# Patient Record
Sex: Male | Born: 1960
Health system: Southern US, Community
[De-identification: ages and names within clinical notes are randomized; demographics above are authoritative.]

## PROBLEM LIST (undated history)

## (undated) DIAGNOSIS — K429 Umbilical hernia without obstruction or gangrene: Secondary | ICD-10-CM

## (undated) DIAGNOSIS — I10 Essential (primary) hypertension: Secondary | ICD-10-CM

## (undated) DIAGNOSIS — R972 Elevated prostate specific antigen [PSA]: Secondary | ICD-10-CM

## (undated) DIAGNOSIS — C61 Malignant neoplasm of prostate: Secondary | ICD-10-CM

## (undated) HISTORY — PX: COLONOSCOPY: SHX174

## (undated) HISTORY — PX: HERNIA REPAIR: SHX51

## (undated) HISTORY — DX: Elevated prostate specific antigen (PSA): R97.20

## (undated) HISTORY — PX: KNEE SURGERY: SHX244

---

## 2012-07-13 ENCOUNTER — Ambulatory Visit: Payer: Self-pay | Admitting: Orthopedic Surgery

## 2012-08-28 LAB — HM COLONOSCOPY: HM Colonoscopy: NORMAL

## 2014-10-10 LAB — LIPID PANEL
Cholesterol: 149 mg/dL (ref 0–200)
HDL: 47 mg/dL (ref 35–70)
LDL Cholesterol: 76 mg/dL
Triglycerides: 132 mg/dL (ref 40–160)

## 2014-10-10 LAB — HEMOGLOBIN A1C: Hgb A1c MFr Bld: 5.9 % (ref 4.0–6.0)

## 2014-12-12 LAB — PSA: PSA: 3.2

## 2015-01-22 ENCOUNTER — Other Ambulatory Visit: Payer: Self-pay | Admitting: Family Medicine

## 2015-03-22 ENCOUNTER — Other Ambulatory Visit: Payer: Self-pay | Admitting: Family Medicine

## 2015-04-11 DIAGNOSIS — J302 Other seasonal allergic rhinitis: Secondary | ICD-10-CM

## 2015-04-11 DIAGNOSIS — M25561 Pain in right knee: Secondary | ICD-10-CM

## 2015-04-11 DIAGNOSIS — M25512 Pain in left shoulder: Secondary | ICD-10-CM

## 2015-04-11 DIAGNOSIS — I152 Hypertension secondary to endocrine disorders: Secondary | ICD-10-CM | POA: Insufficient documentation

## 2015-04-11 DIAGNOSIS — E8881 Metabolic syndrome: Secondary | ICD-10-CM

## 2015-04-11 DIAGNOSIS — E1159 Type 2 diabetes mellitus with other circulatory complications: Secondary | ICD-10-CM | POA: Insufficient documentation

## 2015-04-11 DIAGNOSIS — R7309 Other abnormal glucose: Secondary | ICD-10-CM

## 2015-04-11 DIAGNOSIS — E785 Hyperlipidemia, unspecified: Secondary | ICD-10-CM

## 2015-04-11 DIAGNOSIS — K429 Umbilical hernia without obstruction or gangrene: Secondary | ICD-10-CM | POA: Insufficient documentation

## 2015-04-11 DIAGNOSIS — E119 Type 2 diabetes mellitus without complications: Secondary | ICD-10-CM | POA: Insufficient documentation

## 2015-04-11 DIAGNOSIS — R718 Other abnormality of red blood cells: Secondary | ICD-10-CM

## 2015-04-11 DIAGNOSIS — I10 Essential (primary) hypertension: Secondary | ICD-10-CM

## 2015-04-11 DIAGNOSIS — E669 Obesity, unspecified: Secondary | ICD-10-CM | POA: Insufficient documentation

## 2015-04-11 DIAGNOSIS — E66812 Obesity, class 2: Secondary | ICD-10-CM

## 2015-04-15 ENCOUNTER — Encounter: Payer: Self-pay | Admitting: Family Medicine

## 2015-04-15 DIAGNOSIS — J302 Other seasonal allergic rhinitis: Secondary | ICD-10-CM | POA: Insufficient documentation

## 2015-04-16 ENCOUNTER — Encounter (INDEPENDENT_AMBULATORY_CARE_PROVIDER_SITE_OTHER): Payer: Self-pay

## 2015-04-16 ENCOUNTER — Encounter: Payer: Self-pay | Admitting: Family Medicine

## 2015-04-16 ENCOUNTER — Ambulatory Visit (INDEPENDENT_AMBULATORY_CARE_PROVIDER_SITE_OTHER): Payer: 59 | Admitting: Family Medicine

## 2015-04-16 VITALS — BP 124/78 | HR 78 | Temp 98.9°F | Resp 14 | Ht 71.0 in | Wt 247.6 lb

## 2015-04-16 DIAGNOSIS — E785 Hyperlipidemia, unspecified: Secondary | ICD-10-CM | POA: Diagnosis not present

## 2015-04-16 DIAGNOSIS — I10 Essential (primary) hypertension: Secondary | ICD-10-CM

## 2015-04-16 DIAGNOSIS — Z23 Encounter for immunization: Secondary | ICD-10-CM | POA: Diagnosis not present

## 2015-04-16 DIAGNOSIS — E669 Obesity, unspecified: Secondary | ICD-10-CM | POA: Diagnosis not present

## 2015-04-16 DIAGNOSIS — J302 Other seasonal allergic rhinitis: Secondary | ICD-10-CM | POA: Diagnosis not present

## 2015-04-16 DIAGNOSIS — E66811 Obesity, class 1: Secondary | ICD-10-CM

## 2015-04-16 MED ORDER — QUINAPRIL-HYDROCHLOROTHIAZIDE 20-25 MG PO TABS: 1.0000 | ORAL_TABLET | Freq: Every day | ORAL | Status: DC

## 2015-04-16 NOTE — Progress Notes (Signed)
Name: Jeffrey Whitaker   MRN: [id number]    DOB: Nov 03, 1960   Date:04/16/2015       Progress Note  Subjective  Chief Complaint  Chief Complaint  Patient presents with  . Medication Refill    follow-up  . Hypertension  . Hyperlipidemia    HPI  Hypertension: taking medication as prescribed, bp is at goal, denies chest pain or SOB  Hyperlipidemia: he has been taking Pravachol and denies myalgia. Last labs done 09/2014 and LDL was at goal at 76.   Obesity: he has been losing weight, he has been working out to get ready for the [employer] trail. He was supposed to be gone for one week on the hike but they ran out of water ( the creeks were dry) so they only hiked for a day and a half, and had to return home.  He is still exercising to try to go back in the Spring.   Seasonal Allergic Rhinitis: doing well at this time, no rhinorrhea, nasal congestion or sneezing.    Patient Active Problem List   Diagnosis Date Noted  . Allergic rhinitis, seasonal 04/15/2015  . Abnormal blood sugar 04/11/2015  . Benign hypertension 04/11/2015  . Dyslipidemia 04/11/2015  . Obesity (BMI 30.0-34.9) 04/11/2015  . Right medial knee pain 04/11/2015  . Metabolic syndrome 01/21/9484  . Left shoulder pain 04/11/2015  . Umbilical hernia 46/27/0350  . Microcytosis 04/11/2015    Past Surgical History  Procedure Laterality Date  . Replacement total knee      Family History  Problem Relation Age of Onset  . Hypertension Mother   . CAD Mother     Social History   Social History  . Marital Status: Married    Spouse Name: N/A  . Number of Children: N/A  . Years of Education: N/A   Occupational History  . Not on file.   Social History Main Topics  . Smoking status: Former Research scientist (life sciences)  . Smokeless tobacco: Former Systems developer    Quit date: 07/28/1993  . Alcohol Use: No  . Drug Use: No  . Sexual Activity: Yes   Other Topics Concern  . Not on file   Social History Narrative     Current outpatient  prescriptions:  .  aspirin 81 MG chewable tablet, Chew 1 tablet by mouth daily., Disp: , Rfl:  .  Multiple Vitamin tablet, Take 1 tablet by mouth daily., Disp: , Rfl:  .  pravastatin (PRAVACHOL) 40 MG tablet, Take 1 tablet by mouth  daily, Disp: 90 tablet, Rfl: 1 .  quinapril-hydrochlorothiazide (ACCURETIC) 20-25 MG per tablet, Take 1 tablet by mouth daily., Disp: 90 tablet, Rfl: 1  No Known Allergies   ROS  Constitutional: Negative for fever and positive for  weight change.  Respiratory: Negative for cough and shortness of breath.   Cardiovascular: Negative for chest pain or palpitations.  Gastrointestinal: Negative for abdominal pain, no bowel changes.  Musculoskeletal: Negative for gait problem or joint swelling.  Skin: Negative for rash.  Neurological: Negative for dizziness or headache.  No other specific complaints in a complete review of systems (except as listed in HPI above).  Objective  Filed Vitals:   04/16/15 0806  BP: 124/78  Pulse: 78  Temp: 98.9 F (37.2 C)  TempSrc: Oral  Resp: 14  Height: 5\' 11"  (1.803 m)  Weight: 247 lb 9.6 oz (112.311 kg)  SpO2: 96%    Body mass index is 34.55 kg/(m^2).  Physical Exam  Constitutional: Patient appears well-developed and  well-nourished. Obese No distress.  HEENT: head atraumatic, normocephalic, pupils equal and reactive to light, neck supple, throat within normal limits Cardiovascular: Normal rate, regular rhythm and normal heart sounds.  No murmur heard. No BLE edema. Pulmonary/Chest: Effort normal and breath sounds normal. No respiratory distress. Abdominal: Soft.  There is no tenderness. Psychiatric: Patient has a normal mood and affect. behavior is normal. Judgment and thought content normal.   PHQ2/9: Depression screen PHQ 2/9 04/16/2015  Decreased Interest 0  Down, Depressed, Hopeless 0  PHQ - 2 Score 0     Fall Risk: Fall Risk  04/16/2015  Falls in the past year? No     Functional Status Survey: Is  the patient deaf or have difficulty hearing?: No Does the patient have difficulty seeing, even when wearing glasses/contacts?: Yes (glasses) Does the patient have difficulty concentrating, remembering, or making decisions?: No Does the patient have difficulty walking or climbing stairs?: No Does the patient have difficulty dressing or bathing?: No Does the patient have difficulty doing errands alone such as visiting a doctor's office or shopping?: No   Assessment & Plan  1. Benign hypertension At normal range, continue medication - quinapril-hydrochlorothiazide (ACCURETIC) 20-25 MG per tablet; Take 1 tablet by mouth daily.  Dispense: 90 tablet; Refill: 1  2. Needs flu shot  - Flu Vaccine QUAD 36+ mos PF IM (Fluarix & Fluzone Quad PF)   3. Allergic rhinitis, seasonal Not currently on medication, symptoms are usually Spring and rarely in the Fall  4. Obesity (BMI 30.0-34.9) He is down on his BMI, doing well with life style modification, encouraged him to continue the good work   5. Dyslipidemia Continue medication, recheck labs next visit

## 2015-05-11 ENCOUNTER — Encounter: Payer: Self-pay | Admitting: Family Medicine

## 2015-05-11 ENCOUNTER — Ambulatory Visit (INDEPENDENT_AMBULATORY_CARE_PROVIDER_SITE_OTHER): Payer: 59 | Admitting: Family Medicine

## 2015-05-11 VITALS — BP 114/80 | HR 78 | Temp 98.0°F | Resp 18 | Ht 71.0 in | Wt 251.7 lb

## 2015-05-11 DIAGNOSIS — E669 Obesity, unspecified: Secondary | ICD-10-CM | POA: Diagnosis not present

## 2015-05-11 DIAGNOSIS — E8881 Metabolic syndrome: Secondary | ICD-10-CM | POA: Diagnosis not present

## 2015-05-11 MED ORDER — NALTREXONE-BUPROPION HCL ER 8-90 MG PO TB12: 2.0000 | ORAL_TABLET | Freq: Two times a day (BID) | ORAL | Status: DC

## 2015-05-11 NOTE — Progress Notes (Signed)
Name: Jeffrey Whitaker   MRN: [id number]    DOB: [birth year]   Date:05/11/2015       Progress Note  Subjective  Chief Complaint  Chief Complaint  Patient presents with  . Obesity    Patient is using a Physiological scientist 2x weekly and going to [employer] 45-60 minutes, the other two days. [employer] and has to fill out appeal for not reaching BMI required by [facility].     HPI  Obesity: he has changed his diet and has been exercising, he has lost 14 lbs since last year, but has reached a plateau, he would like to try medication to see if it will help him lose more weight.   Metabolic Syndrome: on diet and exercise, denies polyphagia, polyuria or polydipsia  Patient Active Problem List   Diagnosis Date Noted  . Allergic rhinitis, seasonal 04/15/2015  . Abnormal blood sugar 04/11/2015  . Benign hypertension 04/11/2015  . Dyslipidemia 04/11/2015  . Obesity (BMI 30.0-34.9) 04/11/2015  . Right medial knee pain 04/11/2015  . Metabolic syndrome 63/84/6659  . Left shoulder pain 04/11/2015  . Umbilical hernia 93/57/0177  . Microcytosis 04/11/2015    Past Surgical History  Procedure Laterality Date  . Replacement total knee      Family History  Problem Relation Age of Onset  . Hypertension Mother   . CAD Mother     Social History   Social History  . Marital Status: Married    Spouse Name: N/A  . Number of Children: N/A  . Years of Education: N/A   Occupational History  . Not on file.   Social History Main Topics  . Smoking status: Former Research scientist (life sciences)  . Smokeless tobacco: Former Systems developer    Quit date: 07/28/1993  . Alcohol Use: No  . Drug Use: No  . Sexual Activity: Yes   Other Topics Concern  . Not on file   Social History Narrative     Current outpatient prescriptions:  .  aspirin 81 MG chewable tablet, Chew 1 tablet by mouth daily., Disp: , Rfl:  .  Multiple Vitamin tablet, Take 1 tablet by mouth daily., Disp: , Rfl:  .  pravastatin (PRAVACHOL) 40 MG tablet,  Take 1 tablet by mouth  daily, Disp: 90 tablet, Rfl: 1 .  quinapril-hydrochlorothiazide (ACCURETIC) 20-25 MG per tablet, Take 1 tablet by mouth daily., Disp: 90 tablet, Rfl: 1  No Known Allergies   ROS  Constitutional: Negative for fever or significant  weight change.  Respiratory: Negative for cough and shortness of breath.   Cardiovascular: Negative for chest pain or palpitations.  Gastrointestinal: Negative for abdominal pain, no bowel changes.  Musculoskeletal: Negative for gait problem or joint swelling.  Skin: Negative for rash.  Neurological: Negative for dizziness or headache.  No other specific complaints in a complete review of systems (except as listed in HPI above).  Objective  Filed Vitals:   05/11/15 1114  BP: 114/80  Pulse: 78  Temp: 98 F (36.7 C)  TempSrc: Oral  Resp: 18  Height: 5\' 11"  (1.803 m)  Weight: 251 lb 11.2 oz (114.17 kg)  SpO2: 98%    Body mass index is 35.12 kg/(m^2).  Physical Exam  Constitutional: Patient appears well-developed and well-nourished. Obese No distress.  HEENT: head atraumatic, normocephalic, pupils equal and reactive to light, neck supple, throat within normal limits Cardiovascular: Normal rate, regular rhythm and normal heart sounds.  No murmur heard. No BLE edema. Pulmonary/Chest: Effort normal and breath sounds normal. No respiratory  distress. Abdominal: Soft.  There is no tenderness. Psychiatric: Patient has a normal mood and affect. behavior is normal. Judgment and thought content normal.   PHQ2/9: Depression screen PHQ 2/9 04/16/2015  Decreased Interest 0  Down, Depressed, Hopeless 0  PHQ - 2 Score 0    Fall Risk: Fall Risk  04/16/2015  Falls in the past year? No     Assessment & Plan  1. Obesity (BMI 30.0-34.9)  Discussed all optiosns for weight loss medications including Belviq, Qsymia, Saxenda and Contrave. Discussed risk and benefits of each of them. Discussed with the patient the risk posed by an  increased BMI. Discussed importance of portion control, calorie counting and at least 150 minutes of physical activity weekly. Avoid sweet beverages and drink more water. Eat at least 6 servings of fruit and vegetables daily   - Naltrexone-Bupropion HCl ER (CONTRAVE) 8-90 MG TB12; Take 2 tablets by mouth 2 (two) times daily.  Dispense: 120 tablet; Refill: 1  2. Metabolic syndrome  Continue life style modification

## 2015-05-18 ENCOUNTER — Ambulatory Visit
Admission: EM | Admit: 2015-05-18 | Discharge: 2015-05-18 | Disposition: A | Discharge status: Home / Self Care | Payer: 59 | Attending: Emergency Medicine | Admitting: Emergency Medicine

## 2015-05-18 ENCOUNTER — Encounter: Payer: Self-pay | Admitting: Emergency Medicine

## 2015-05-18 DIAGNOSIS — S76311A Strain of muscle, fascia and tendon of the posterior muscle group at thigh level, right thigh, initial encounter: Secondary | ICD-10-CM | POA: Diagnosis not present

## 2015-05-18 DIAGNOSIS — T148 Other injury of unspecified body region: Secondary | ICD-10-CM | POA: Diagnosis not present

## 2015-05-18 DIAGNOSIS — T148XXA Other injury of unspecified body region, initial encounter: Secondary | ICD-10-CM

## 2015-05-18 HISTORY — DX: Essential (primary) hypertension: I10

## 2015-05-18 MED ORDER — IBUPROFEN 800 MG PO TABS: 800.0000 mg | ORAL_TABLET | Freq: Three times a day (TID) | ORAL | Status: DC | PRN

## 2015-05-18 MED ORDER — CYCLOBENZAPRINE HCL 10 MG PO TABS: 10.0000 mg | ORAL_TABLET | Freq: Three times a day (TID) | ORAL | Status: DC | PRN

## 2015-05-18 NOTE — ED Notes (Signed)
Pt reports right hip pain that started yesterday after working out and unsure if "pulled something" Pt reports pain down in right hamstring as well

## 2015-05-18 NOTE — ED Provider Notes (Signed)
[facility] Emergency Department Provider Note  ____________________________________________  Time seen: Approximately 2:24 PM  I have reviewed the triage vital signs and the nursing notes.   HISTORY  Chief Complaint Hip Pain   HPI Jeffrey Whitaker is a 54 y.o. male presents for complaints of right posterior leg and right buttocks area pain. States pain onset was after working out with Physiological scientist yesterday. Patient reports that yesterday afternoon he was working out with a Physiological scientist. States his personal trainer workout session was approximately 45 minutes long. Patient reports that during the session the activities included a variety of activity.  States that majority of the workout included squatting throwing a medicine ball. Also reports planking, running, and bench presses. Patient states that he felt some achy pain in right hamstring and buttocks area while squatting and throwing medicine ball but states the pain was mild. Patient states when he got home yesterday after working out he sat still for approximately 1 hour and states when he got up pain had increased. Patient denies fall or direct trauma. Denies difficulty with full range of motion but reports some pain with range of motion. States current pain is 4 out of 10. States pain is primarily with movement. State pain is described as an ache and tension pain, and pulling with movement. States minimal pain with bending or twisting, but more pain with walking and squatting motions. Denies numbness or tingling sensation. Denies pain radiation. Denies neck or back pain.  Denies urinary or bowel retention or incontinence. Denies fever, chest pain, abdominal pain, neck pain, back pain or shortness of breath. Denies upper extremity pain. Denies other leg pain. Reports continues to eat and drink well. Again denies fall or direct trauma. Denies redness or swelling.    Past Medical History  Diagnosis Date  .  Hypertension     Patient Active Problem List   Diagnosis Date Noted  . Allergic rhinitis, seasonal 04/15/2015  . Abnormal blood sugar 04/11/2015  . Benign hypertension 04/11/2015  . Dyslipidemia 04/11/2015  . Obesity (BMI 30.0-34.9) 04/11/2015  . Right medial knee pain 04/11/2015  . Metabolic syndrome 63/87/5643  . Left shoulder pain 04/11/2015  . Umbilical hernia 32/95/1884  . Microcytosis 04/11/2015    Past Surgical History  Procedure Laterality Date  . Replacement total knee      Current Outpatient Rx  Name  Route  Sig  Dispense  Refill  .           .           .           . Multiple Vitamin tablet   Oral   Take 1 tablet by mouth daily.         .           . pravastatin (PRAVACHOL) 40 MG tablet      Take 1 tablet by mouth  daily   90 tablet   1   . quinapril-hydrochlorothiazide (ACCURETIC) 20-25 MG per tablet   Oral   Take 1 tablet by mouth daily.   90 tablet   1     Allergies Review of patient's allergies indicates no known allergies.  Family History  Problem Relation Age of Onset  . Hypertension Mother   . CAD Mother     Social History Social History  Substance Use Topics  . Smoking status: Former Research scientist (life sciences)  . Smokeless tobacco: Former Systems developer    Quit date: 07/28/1993  . Alcohol  Use: No    Review of Systems Constitutional: No fever/chills Eyes: No visual changes. ENT: No sore throat. Cardiovascular: Denies chest pain. Respiratory: Denies shortness of breath. Gastrointestinal: No abdominal pain.  No nausea, no vomiting.  No diarrhea.  No constipation. Genitourinary: Negative for dysuria. Musculoskeletal: Negative for back pain. Right leg and buttocks pain.  Skin: Negative for rash. Neurological: Negative for headaches, focal weakness or numbness.  10-point ROS otherwise negative.  ____________________________________________   PHYSICAL EXAM:  VITAL SIGNS: ED Triage Vitals  Enc Vitals Group     BP 05/18/15 1401 139/76 mmHg      Pulse Rate 05/18/15 1401 74     Resp 05/18/15 1401 16     Temp 05/18/15 1401 98 F (36.7 C)     Temp Source 05/18/15 1401 Oral     SpO2 05/18/15 1401 99 %     Weight 05/18/15 1401 245 lb (111.131 kg)     Height 05/18/15 1401 5\' 11"  (1.803 m)     Head Cir --      Peak Flow --      Pain Score 05/18/15 1402 7     Pain Loc --      Pain Edu? --      Excl. in [place]? --     Constitutional: Alert and oriented. Well appearing and in no acute distress. Eyes: Conjunctivae are normal. PERRL. EOMI. Head: Atraumatic.  Nose: No congestion/rhinnorhea.  Mouth/Throat: Mucous membranes are moist.  Oropharynx non-erythematous. Neck: No stridor.  No cervical spine tenderness to palpation. Cardiovascular: Normal rate, regular rhythm. Grossly normal heart sounds.  Good peripheral circulation. Respiratory: Normal respiratory effort.  No retractions. Lungs CTAB. Gastrointestinal: Soft and nontender. No distention. Normal Bowel sounds.  No CVA tenderness.  Musculoskeletal: No lower or upper extremity tenderness nor edema.  No joint effusions. Bilateral pedal pulses equal and easily palpated.No cervical, thoracic or lumbar TTP.  Except: right poster gluteus mild TTP, right posterior generalized hamstring mild TTP, no swelling, no ecchymosis, no erythema. Changes positions from lying to standing quickly without discomfort or distress. No saddle anesthesia. No bony TTP. Right knee nontender. No leg swelling. No calf TTP bilaterally. Full ROM to back and bilateral lower extremities. Pain to right gluteus and right hamstring increases with squatting movement, minimal pain with hip and knee flexion. No pain with hip abduction or adduction. Ambulatory in room with steady gait. No antalgic gait.  Neurologic:  Normal speech and language. No gross focal neurologic deficits are appreciated. No gait instability. Skin:  Skin is warm, dry and intact. No rash noted. Psychiatric: Mood and affect are normal. Speech and behavior are  normal.  ____________________________________________   LABS (all labs ordered are listed, but only abnormal results are displayed)  Labs Reviewed - No data to display   INITIAL IMPRESSION / ASSESSMENT AND PLAN / ED COURSE  Pertinent labs & imaging results that were available during my care of the patient were reviewed by me and considered in my medical decision making (see chart for details).  Very well-appearing patient. No acute distress. Changes positions from lying to standing quickly without distress. Presents for right posterior hamstring and right gluteal pain 1 day. Pain onset post working out. States pain is gradually increased. Reports pain is primarily with movement. No bony tenderness. Point muscular tenderness. Patient reports over-the-counter ibuprofen has helped but not relief pain. Patient states that he walks several miles a day at work and the pain aggravated him at work today. No bony tenderness. Denies  trauma. Suspect muscular strain with point muscular TTP to right posterior hamstring generalized and right gluteus muscle. Will treat with supportive treatments including rest, ice and heat alternation, when necessary ibuprofen or Tylenol, Flexeril as needed for pain. Discussed continued stretching. Discussed avoidance of early strenuous activity.Discussed follow up with Primary care physician this week. Discussed follow up and return parameters including no resolution or any worsening concerns. Patient verbalized understanding and agreed to plan.   ____________________________________________   FINAL CLINICAL IMPRESSION(S) / ED DIAGNOSES  Final diagnoses:  Muscle strain  Hamstring strain, right, initial encounter       Marylene Land, NP 05/18/15 1609

## 2015-05-18 NOTE — Discharge Instructions (Signed)
Alternate heat and ice. Stretch. Take medication as prescribed. Avoid strenuous activity.  Follow up with your primary care physician this week as needed. Return to Urgent care for new or worsening concerns.   Muscle Strain A muscle strain is an injury that occurs when a muscle is stretched beyond its normal length. Usually a small number of muscle fibers are torn when this happens. Muscle strain is rated in degrees. First-degree strains have the least amount of muscle fiber tearing and pain. Second-degree and third-degree strains have increasingly more tearing and pain.  Usually, recovery from muscle strain takes 1-2 weeks. Complete healing takes 5-6 weeks.  CAUSES  Muscle strain happens when a sudden, violent force placed on a muscle stretches it too far. This may occur with lifting, sports, or a fall.  RISK FACTORS Muscle strain is especially common in athletes.  SIGNS AND SYMPTOMS At the site of the muscle strain, there may be:  Pain.  Bruising.  Swelling.  Difficulty using the muscle due to pain or lack of normal function. DIAGNOSIS  Your health care provider will perform a physical exam and ask about your medical history. TREATMENT  Often, the best treatment for a muscle strain is resting, icing, and applying cold compresses to the injured area.  HOME CARE INSTRUCTIONS   Use the PRICE method of treatment to promote muscle healing during the first 2-3 days after your injury. The PRICE method involves:  Protecting the muscle from being injured again.  Restricting your activity and resting the injured body part.  Icing your injury. To do this, put ice in a plastic bag. Place a towel between your skin and the bag. Then, apply the ice and leave it on from 15-20 minutes each hour. After the third day, switch to moist heat packs.  Apply compression to the injured area with a splint or elastic bandage. Be careful not to wrap it too tightly. This may interfere with blood circulation  or increase swelling.  Elevate the injured body part above the level of your heart as often as you can.  Only take over-the-counter or prescription medicines for pain, discomfort, or fever as directed by your health care provider.  Warming up prior to exercise helps to prevent future muscle strains. SEEK MEDICAL CARE IF:   You have increasing pain or swelling in the injured area.  You have numbness, tingling, or a significant loss of strength in the injured area. MAKE SURE YOU:   Understand these instructions.  Will watch your condition.  Will get help right away if you are not doing well or get worse.   This information is not intended to replace advice given to you by your health care provider. Make sure you discuss any questions you have with your health care provider.   Document Released: 07/14/2005 Document Revised: 05/04/2013 Document Reviewed: 02/10/2013 Elsevier Interactive Patient Education 2016 [place].  Hamstring Strain A hamstring strain is an injury that occurs when the hamstring muscles are overstretched or overloaded. The hamstring muscles are a group of muscles at the back of the thighs. These muscles are used in straightening the hips, bending the knees, and pulling back the legs. This type of injury is often called a pulled hamstring muscle. The severity of a muscle strain is rated in degrees. First-degree strains have the least amount of muscle fiber tearing and pain. Second-degree and third-degree strains have increasingly more tearing and pain. CAUSES Hamstring strains occur when a sudden, violent force is placed on these muscles  and stretches them too far. This often occurs during activities that involve running, jumping, kicking, or weight lifting. RISK FACTORS Hamstring strains are especially common in athletes. Other things that can increase your risk for this injury include:  Having low strength, endurance, or flexibility of the hamstring  muscles.  Performing high-impact physical activity.  Having poor physical fitness.  Having a previous leg injury.  Having fatigued muscles.  Older age. SIGNS AND SYMPTOMS  Pain in the back of the thigh.  Bruising.  Swelling.  Muscle spasm.  Difficulty using the muscle because of pain or lack of normal function. For severe strains, you may have a popping or snapping feeling when the injury occurs. DIAGNOSIS Your health care provider will perform a physical exam and ask about your medical history.  TREATMENT Often, the best treatment for a hamstring strain is protecting, resting, icing, applying compression, and elevating the injured area. This is referred to as the PRICE method of treatment. Your health care provider may also recommend medicines to help reduce pain or inflammation. HOME CARE INSTRUCTIONS  Use the PRICE method of treatment to promote muscle healing during the first 2-3 days after your injury. The PRICE method involves:  P--Protecting the muscle from being injured again.  R--Restricting your activity and resting the injured body part.  I--Icing your injury. To do this, put ice in a plastic bag. Place a towel between your skin and the bag. Then, apply the ice and leave it on for 20 minutes, 2-3 times per day. After the third day, switch to moist heat packs.  C--Applying compression to the injured area with an elastic bandage. Be careful not to wrap it too tightly. That may interfere with blood circulation or may increase swelling.  E--Elevating the injured body part above the level of your heart as often as you can. You can do this by putting a pillow under your thigh when you sit or lie down.  Take medicines only as directed by your health care provider.  Begin exercising or stretching as directed by your health care provider.  Do not return to full activity level until your health care provider approves.  Keep all follow-up visits as directed by your  health care provider. This is important. SEEK MEDICAL CARE IF:  You have increasing pain or swelling in the injured area.  You have numbness, tingling, or a significant loss of strength in the injured area.  Your foot or your toes become cold or turn blue.   This information is not intended to replace advice given to you by your health care provider. Make sure you discuss any questions you have with your health care provider.   Document Released: 04/08/2001 Document Revised: 08/04/2014 Document Reviewed: 02/27/2014 Elsevier Interactive Patient Education [employer].

## 2015-06-04 ENCOUNTER — Ambulatory Visit
Admission: EM | Admit: 2015-06-04 | Discharge: 2015-06-04 | Disposition: A | Discharge status: Home / Self Care | Payer: 59 | Attending: Emergency Medicine | Admitting: Emergency Medicine

## 2015-06-04 ENCOUNTER — Ambulatory Visit (INDEPENDENT_AMBULATORY_CARE_PROVIDER_SITE_OTHER): Payer: 59

## 2015-06-04 DIAGNOSIS — M25559 Pain in unspecified hip: Secondary | ICD-10-CM | POA: Diagnosis not present

## 2015-06-04 DIAGNOSIS — M7071 Other bursitis of hip, right hip: Secondary | ICD-10-CM

## 2015-06-04 MED ORDER — IBUPROFEN 800 MG PO TABS: 800.0000 mg | ORAL_TABLET | Freq: Three times a day (TID) | ORAL | Status: DC | PRN

## 2015-06-04 MED ORDER — METAXALONE 800 MG PO TABS: 800.0000 mg | ORAL_TABLET | Freq: Three times a day (TID) | ORAL | Status: DC

## 2015-06-04 NOTE — ED Provider Notes (Signed)
HPI  SUBJECTIVE:  Jeffrey Whitaker is a 54 y.o. male who presents with persistent, intermittent right hip and hamstring pain that has been present for the past 2 weeks. Symptoms started after working out with a medicine ball, doing squats. He also reports a pulling sensation along the lateral knee, calf. He does rides the pain as stabbing, dull, throbbing, especially at his knee and posterior thigh. Symptoms are worse in the morning and at night, lying on his right side. There are no alleviating factors. He has been taking ibuprofen 800 mg, Tylenol, stretching, tried a femoral. He tried some Flexeril as prescribed to him on his most recent visit, but states that it "knocked him out". He states that he has been resting it has not done any leg workouts since the original injury. He denies nausea, vomiting, fevers, erythema, swelling, rash, numbness, tingling, bruising, weakness, recent illness. No previous history of injury to the hip. No abdominal pain, back pain. Past medical history of hypertension, hypercholesterolemia. No history of diabetes, immunocompromise.   Previous records reviewed. Patient was seen here on 10/21 for right buttock/hip pain thought to have hamstring strain, sent home with Tylenol, ibuprofen, Flexeril, stretching, rest.   Past Medical History  Diagnosis Date  . Hypertension     Past Surgical History  Procedure Laterality Date  . Replacement total knee    . Joint replacement      Family History  Problem Relation Age of Onset  . Hypertension Mother   . CAD Mother     Social History  Substance Use Topics  . Smoking status: Former Research scientist (life sciences)  . Smokeless tobacco: Former Systems developer    Quit date: 07/28/1993  . Alcohol Use: No    No current facility-administered medications for this encounter.  Current outpatient prescriptions:  .  aspirin 81 MG chewable tablet, Chew 1 tablet by mouth daily., Disp: , Rfl:  .  Multiple Vitamin tablet, Take 1 tablet by mouth daily., Disp: ,  Rfl:  .  pravastatin (PRAVACHOL) 40 MG tablet, Take 1 tablet by mouth  daily, Disp: 90 tablet, Rfl: 1 .  quinapril-hydrochlorothiazide (ACCURETIC) 20-25 MG per tablet, Take 1 tablet by mouth daily., Disp: 90 tablet, Rfl: 1 .  ibuprofen (ADVIL,MOTRIN) 800 MG tablet, Take 1 tablet (800 mg total) by mouth every 8 (eight) hours as needed for fever, mild pain or moderate pain., Disp: 20 tablet, Rfl: 0 .  metaxalone (SKELAXIN) 800 MG tablet, Take 1 tablet (800 mg total) by mouth 3 (three) times daily., Disp: 21 tablet, Rfl: 0 .  Naltrexone-Bupropion HCl ER (CONTRAVE) 8-90 MG TB12, Take 2 tablets by mouth 2 (two) times daily., Disp: 120 tablet, Rfl: 1  No Known Allergies   ROS  As noted in HPI.   Physical Exam  BP 124/92 mmHg  Pulse 78  Temp(Src) 96.8 F (36 C) (Tympanic)  Resp 16  Ht 5\' 11"  (1.803 m)  Wt 245 lb (111.131 kg)  BMI 34.19 kg/m2  SpO2 100%  Constitutional: Well developed, well nourished, no acute distress Eyes:  EOMI, conjunctiva normal bilaterally HENT: Normocephalic, atraumatic,mucus membranes moist Respiratory: Normal inspiratory effort Cardiovascular: Normal rate GI: nondistended MSK:No bruising, erythema, rash. +tenderness gluteus medius,  ASIS, iliopsoas, pectinus, sartorius, medial posterior hamstring. No tenderness down IT band, quadriceps. No pain with passive abduction/adduction of leg. + pain with int rotation hip. No pain with external rotation. Tenderness over the greater trochanter. No tenderness at sciatic notch. Roll test for muscle spasm negative. Flexion/extension knee WNL. Knee joint NT,  stable. Motor strength flexion/ext hip 5/5. Sensation to LT intact. DP 2+ Neurologic: Alert & oriented x 3, no focal neuro deficits Psychiatric: Speech and behavior appropriate   ED Course   Medications - No data to display  Orders Placed This Encounter  Procedures  . DG Hip Unilat With Pelvis 2-3 Views Right    Standing Status: Standing     Number of  Occurrences: 1     Standing Expiration Date:     Order Specific Question:  Symptom/Reason for Exam    Answer:  Hip pain [[id number]]    No results found for this or any previous visit (from the past 24 hour(s)). Dg Hip Unilat With Pelvis 2-3 Views Right  06/04/2015  CLINICAL DATA:  Pain right hip after working out October 20th EXAM: DG HIP (WITH OR WITHOUT PELVIS) 2-3V RIGHT COMPARISON:  None. FINDINGS: Mild left and mild-to-moderate right sacroiliitis. Mild bilateral hip joint space narrowing without significant osteophyte formation chest, with mild subchondral cystic change left femoral head. Pelvic bones intact. No evidence of fracture or dislocation involving proximal right femur. IMPRESSION: No acute findings.  Degenerative changes as described. Electronically Signed   By: Skipper Cliche M.D.   On: 06/04/2015 19:24    ED Clinical Impression  Bursitis Right hip pain  ED Assessment/Plan  Doubt septic joint. Feel that this is more of bursitis perhaps trochanteric bursa, iliopsoas bursa versus a sprain/strain of the hip. Getting x-ray to rule out avulsion fracture. Reviewed  imaging independently. No fracture dislocation. No acute findings. Mild bilateral degenerative changes. See radiology report for full details.  No evidence of injury to the knee. Feel that knee pain is due to compensation. Plan to send home with continued ibuprofen, Skelaxin, PT, orthopedic referral. Skelaxin because patient has continued muscle tightness, mild muscle spasm.   Discussed  imaging, MDM, plan and followup with patient. Discussed sn/sx that should prompt return to the UC or ED. Patient  agrees with plan.   *This clinic note was created using Dragon dictation software. Therefore, there may be occasional mistakes despite careful proofreading.  ?   Melynda Ripple, MD 06/04/15 [phone]

## 2015-06-04 NOTE — Discharge Instructions (Signed)
You'll need to follow-up with physical therapy and orthopedics if you're not getting better within a week. Continue the relative rest. Start some stretching and try some deep tissue massage. Go to the ER for fever, inability to walk, weakness in your leg, or other concerns

## 2015-06-04 NOTE — ED Notes (Signed)
Seen here at [facility] 2 weeks ago and Dx with Hamstring problems. No real improvement. C/o right lateral iliac and hip hip radiating laterally down right leg to knee.

## 2015-07-03 ENCOUNTER — Ambulatory Visit: Payer: 59 | Admitting: Family Medicine

## 2015-07-11 ENCOUNTER — Other Ambulatory Visit: Payer: Self-pay | Admitting: Family Medicine

## 2015-07-11 NOTE — Telephone Encounter (Signed)
Patient requesting refill. 

## 2015-10-15 ENCOUNTER — Ambulatory Visit (INDEPENDENT_AMBULATORY_CARE_PROVIDER_SITE_OTHER): Payer: 59 | Admitting: Family Medicine

## 2015-10-15 ENCOUNTER — Encounter: Payer: Self-pay | Admitting: Family Medicine

## 2015-10-15 VITALS — BP 116/70 | HR 95 | Temp 98.9°F | Resp 16 | Wt 256.3 lb

## 2015-10-15 DIAGNOSIS — Z1321 Encounter for screening for nutritional disorder: Secondary | ICD-10-CM | POA: Diagnosis not present

## 2015-10-15 DIAGNOSIS — R718 Other abnormality of red blood cells: Secondary | ICD-10-CM

## 2015-10-15 DIAGNOSIS — E8881 Metabolic syndrome: Secondary | ICD-10-CM

## 2015-10-15 DIAGNOSIS — Z23 Encounter for immunization: Secondary | ICD-10-CM | POA: Diagnosis not present

## 2015-10-15 DIAGNOSIS — Z125 Encounter for screening for malignant neoplasm of prostate: Secondary | ICD-10-CM | POA: Diagnosis not present

## 2015-10-15 DIAGNOSIS — I10 Essential (primary) hypertension: Secondary | ICD-10-CM | POA: Diagnosis not present

## 2015-10-15 DIAGNOSIS — J302 Other seasonal allergic rhinitis: Secondary | ICD-10-CM

## 2015-10-15 DIAGNOSIS — J069 Acute upper respiratory infection, unspecified: Secondary | ICD-10-CM | POA: Diagnosis not present

## 2015-10-15 DIAGNOSIS — E785 Hyperlipidemia, unspecified: Secondary | ICD-10-CM | POA: Diagnosis not present

## 2015-10-15 MED ORDER — BENZONATATE 200 MG PO CAPS: 200.0000 mg | ORAL_CAPSULE | Freq: Three times a day (TID) | ORAL | Status: DC | PRN

## 2015-10-15 MED ORDER — PRAVASTATIN SODIUM 40 MG PO TABS: ORAL_TABLET | ORAL | Status: DC

## 2015-10-15 MED ORDER — QUINAPRIL-HYDROCHLOROTHIAZIDE 20-25 MG PO TABS: 1.0000 | ORAL_TABLET | Freq: Every day | ORAL | Status: DC

## 2015-10-15 MED ORDER — FLUTICASONE PROPIONATE 50 MCG/ACT NA SUSP: 2.0000 | Freq: Every day | NASAL | Status: DC

## 2015-10-15 NOTE — Progress Notes (Signed)
Name: Jeffrey Whitaker   MRN: [id number]    DOB: 12-17-1960   Date:10/15/2015       Progress Note  Subjective  Chief Complaint  Chief Complaint  Patient presents with  . Hypertension    patient is here for his 71-month f/u. patient presents with no neg sx.   . Sinusitis    patient stated that he has had sinus congestion and cough x 1 week. otc mucinex    HPI  URI: he states that last week he developed some fatigue, nasal congestion and mild post-nasal drainage. He now has a cough ( tickle in his throat) and also has pain behind both eyes. Normal appetite, no fever or chills. No body aches.  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   Patient Active Problem List   Diagnosis Date Noted  . Allergic rhinitis, seasonal 04/15/2015  . Abnormal blood sugar 04/11/2015  . Benign hypertension 04/11/2015  . Dyslipidemia 04/11/2015  . Obesity (BMI 30.0-34.9) 04/11/2015  . Right medial knee pain 04/11/2015  . Metabolic syndrome 99991111  . Left shoulder pain 04/11/2015  . Umbilical hernia [id number]  . Microcytosis 04/11/2015    Past Surgical History  Procedure Laterality Date  . Replacement total knee    . Joint replacement      Family History  Problem Relation Age of Onset  . Hypertension Mother   . CAD Mother     Social History   Social History  . Marital Status: Married    Spouse Name: N/A  . Number of Children: N/A  . Years of Education: N/A   Occupational History  . Not on file.   Social History Main Topics  . Smoking status: Former Research scientist (life sciences)  . Smokeless tobacco: Former Systems developer   Quit date: 07/28/1993  . Alcohol Use: No  . Drug Use: No  . Sexual Activity: Yes   Other Topics Concern  . Not on file   Social History Narrative     Current outpatient prescriptions:  .  aspirin 81 MG chewable tablet, Chew 1 tablet by mouth daily., Disp: , Rfl:  .  Multiple Vitamin tablet, Take 1 tablet by mouth daily., Disp: , Rfl:  .  pravastatin (PRAVACHOL) 40 MG tablet, Take 1 tablet by mouth  daily, Disp: 90 tablet, Rfl: 1 .  quinapril-hydrochlorothiazide (ACCURETIC) 20-25 MG tablet, Take 1 tablet by mouth daily., Disp: 90 tablet, Rfl: 1 .  benzonatate (TESSALON) 200 MG capsule, Take 1 capsule (200 mg total) by mouth 3 (three) times daily as needed for cough., Disp: 40 capsule, Rfl: 0 .  fluticasone (FLONASE) 50 MCG/ACT nasal spray, Place 2 sprays into both nostrils daily., Disp: 16 g, Rfl: 0  No Known Allergies   ROS  Constitutional: Negative for fever, positive for mild  weight change.  Respiratory:Positive for cough and shortness of breath.   Cardiovascular: Negative for chest pain or palpitations.  Gastrointestinal: Negative for abdominal pain, no bowel changes.  Musculoskeletal: Negative for gait problem or joint swelling.  Skin: Negative for rash.  Neurological: Negative for dizziness or headache.  No other specific complaints in a  complete review of systems (except as listed in HPI above).  Objective  Filed Vitals:   10/15/15 0812  BP: 116/70  Pulse: 95  Temp: 98.9 F (37.2 C)  TempSrc: Oral  Resp: 16  Weight: 256 lb 4.8 oz (116.257 kg)  SpO2: 96%    Body mass index is 35.76 kg/(m^2).  Physical Exam  Constitutional: Patient appears well-developed and well-nourished. Obese  No distress.  HEENT: head atraumatic, normocephalic, pupils equal and reactive to light, ears wax on bilateral canal,  neck supple, throat within normal limits Cardiovascular: Normal rate, regular rhythm and normal heart sounds.  No murmur heard. No BLE edema. Pulmonary/Chest:  Effort normal and breath sounds normal. No respiratory distress. Abdominal: Soft.  There is no tenderness. Psychiatric: Patient has a normal mood and affect. behavior is normal. Judgment and thought content normal.  PHQ2/9: Depression screen [facility] 2/9 10/15/2015 04/16/2015  Decreased Interest 0 0  Down, Depressed, Hopeless 0 0  PHQ - 2 Score 0 0     Fall Risk: Fall Risk  10/15/2015 04/16/2015  Falls in the past year? No No      Functional Status Survey: Is the patient deaf or have difficulty hearing?: No Does the patient have difficulty seeing, even when wearing glasses/contacts?: No Does the patient have difficulty concentrating, remembering, or making decisions?: No Does the patient have difficulty walking or climbing stairs?: No Does the patient have difficulty dressing or bathing?: No Does the patient have difficulty doing errands alone such as visiting a doctor's office or shopping?: No    Assessment & Plan  1. Benign hypertension  Well controlled - EKG 12-Lead - for baseline, non-specific T wave abnormalities - quinapril-hydrochlorothiazide (ACCURETIC) 20-25 MG tablet; Take 1 tablet by mouth daily.  Dispense: 90 tablet; Refill: 1 - Comprehensive metabolic panel  2. Dyslipidemia  - pravastatin (PRAVACHOL) 40 MG tablet; Take 1 tablet by mouth  daily  Dispense: 90 tablet; Refill: 1 - Lipid panel  3. Metabolic syndrome  - Hemoglobin A1c  4. Allergic rhinitis, seasonal  He needs to resume otc medication   5. Microcytosis  - CBC with Differential/Platelet  6. Upper respiratory infection  - benzonatate (TESSALON) 200 MG capsule; Take 1 capsule (200 mg total) by mouth 3 (three) times daily as needed for cough.  Dispense: 40 capsule; Refill: 0 - fluticasone (FLONASE) 50 MCG/ACT nasal spray; Place 2 sprays into both nostrils daily.  Dispense: 16 g; Refill: 0  7. Encounter for vitamin deficiency screening  - VITAMIN D 25 Hydroxy (Vit-D Deficiency, Fractures)  8.  Prostate cancer screening  - PSA

## 2015-10-15 NOTE — Addendum Note (Signed)
Addended by: Inda Coke on: 10/15/2015 08:46 AM   Modules accepted: Orders

## 2015-10-16 LAB — CBC WITH DIFFERENTIAL/PLATELET
BASOS ABS: 0 10*3/uL (ref 0.0–0.2)
BASOS: 0 %
EOS (ABSOLUTE): 0.2 10*3/uL (ref 0.0–0.4)
Eos: 2 %
HEMOGLOBIN: 14.1 g/dL (ref 12.6–17.7)
Hematocrit: 43.6 % (ref 37.5–51.0)
IMMATURE GRANS (ABS): 0 10*3/uL (ref 0.0–0.1)
Immature Granulocytes: 1 %
LYMPHS ABS: 2.2 10*3/uL (ref 0.7–3.1)
LYMPHS: 25 %
MCH: 24 pg — AB (ref 26.6–33.0)
MCHC: 32.3 g/dL (ref 31.5–35.7)
MCV: 74 fL — AB (ref 79–97)
MONOCYTES: 5 %
Monocytes Absolute: 0.5 10*3/uL (ref 0.1–0.9)
NEUTROS ABS: 5.8 10*3/uL (ref 1.4–7.0)
Neutrophils: 67 %
Platelets: 272 10*3/uL (ref 150–379)
RBC: 5.87 x10E6/uL — ABNORMAL HIGH (ref 4.14–5.80)
RDW: 13.7 % (ref 12.3–15.4)
WBC: 8.7 10*3/uL (ref 3.4–10.8)

## 2015-10-16 LAB — LIPID PANEL
CHOL/HDL RATIO: 2.9 ratio (ref 0.0–5.0)
Cholesterol, Total: 138 mg/dL (ref 100–199)
HDL: 48 mg/dL (ref >39)
LDL CALC: 75 mg/dL (ref 0–99)
TRIGLYCERIDES: 77 mg/dL (ref 0–149)
VLDL Cholesterol Cal: 15 mg/dL (ref 5–40)

## 2015-10-16 LAB — COMPREHENSIVE METABOLIC PANEL
A/G RATIO: 1.5 (ref 1.2–2.2)
ALBUMIN: 4.5 g/dL (ref 3.5–5.5)
ALK PHOS: 52 IU/L (ref 39–117)
ALT: 22 IU/L (ref 0–44)
AST: 22 IU/L (ref 0–40)
BILIRUBIN TOTAL: 0.4 mg/dL (ref 0.0–1.2)
BUN / CREAT RATIO: 15 (ref 9–20)
BUN: 19 mg/dL (ref 6–24)
CHLORIDE: 100 mmol/L (ref 96–106)
CO2: 26 mmol/L (ref 18–29)
Calcium: 9.9 mg/dL (ref 8.7–10.2)
Creatinine, Ser: 1.25 mg/dL (ref 0.76–1.27)
GFR calc non Af Amer: 65 mL/min/{1.73_m2} (ref >59)
GFR, EST AFRICAN AMERICAN: 75 mL/min/{1.73_m2} (ref >59)
Globulin, Total: 3 g/dL (ref 1.5–4.5)
Glucose: 107 mg/dL — ABNORMAL HIGH (ref 65–99)
POTASSIUM: 4.5 mmol/L (ref 3.5–5.2)
SODIUM: 143 mmol/L (ref 134–144)
TOTAL PROTEIN: 7.5 g/dL (ref 6.0–8.5)

## 2015-10-16 LAB — PSA: PROSTATE SPECIFIC AG, SERUM: 4.9 ng/mL — AB (ref 0.0–4.0)

## 2015-10-16 LAB — HEMOGLOBIN A1C
ESTIMATED AVERAGE GLUCOSE: 126 mg/dL
HEMOGLOBIN A1C: 6 % — AB (ref 4.8–5.6)

## 2015-10-16 LAB — VITAMIN D 25 HYDROXY (VIT D DEFICIENCY, FRACTURES): Vit D, 25-Hydroxy: 43.7 ng/mL (ref 30.0–100.0)

## 2015-11-17 IMAGING — CR DG HIP (WITH OR WITHOUT PELVIS) 2-3V*R*
3 series · 3 of 3 positions shown · non-contrast
Comparison: None.

CLINICAL DATA: Pain right hip after working [REDACTED][DATE]

EXAM:
DG HIP (WITH OR WITHOUT PELVIS) 2-3V RIGHT

[pelvis ap]
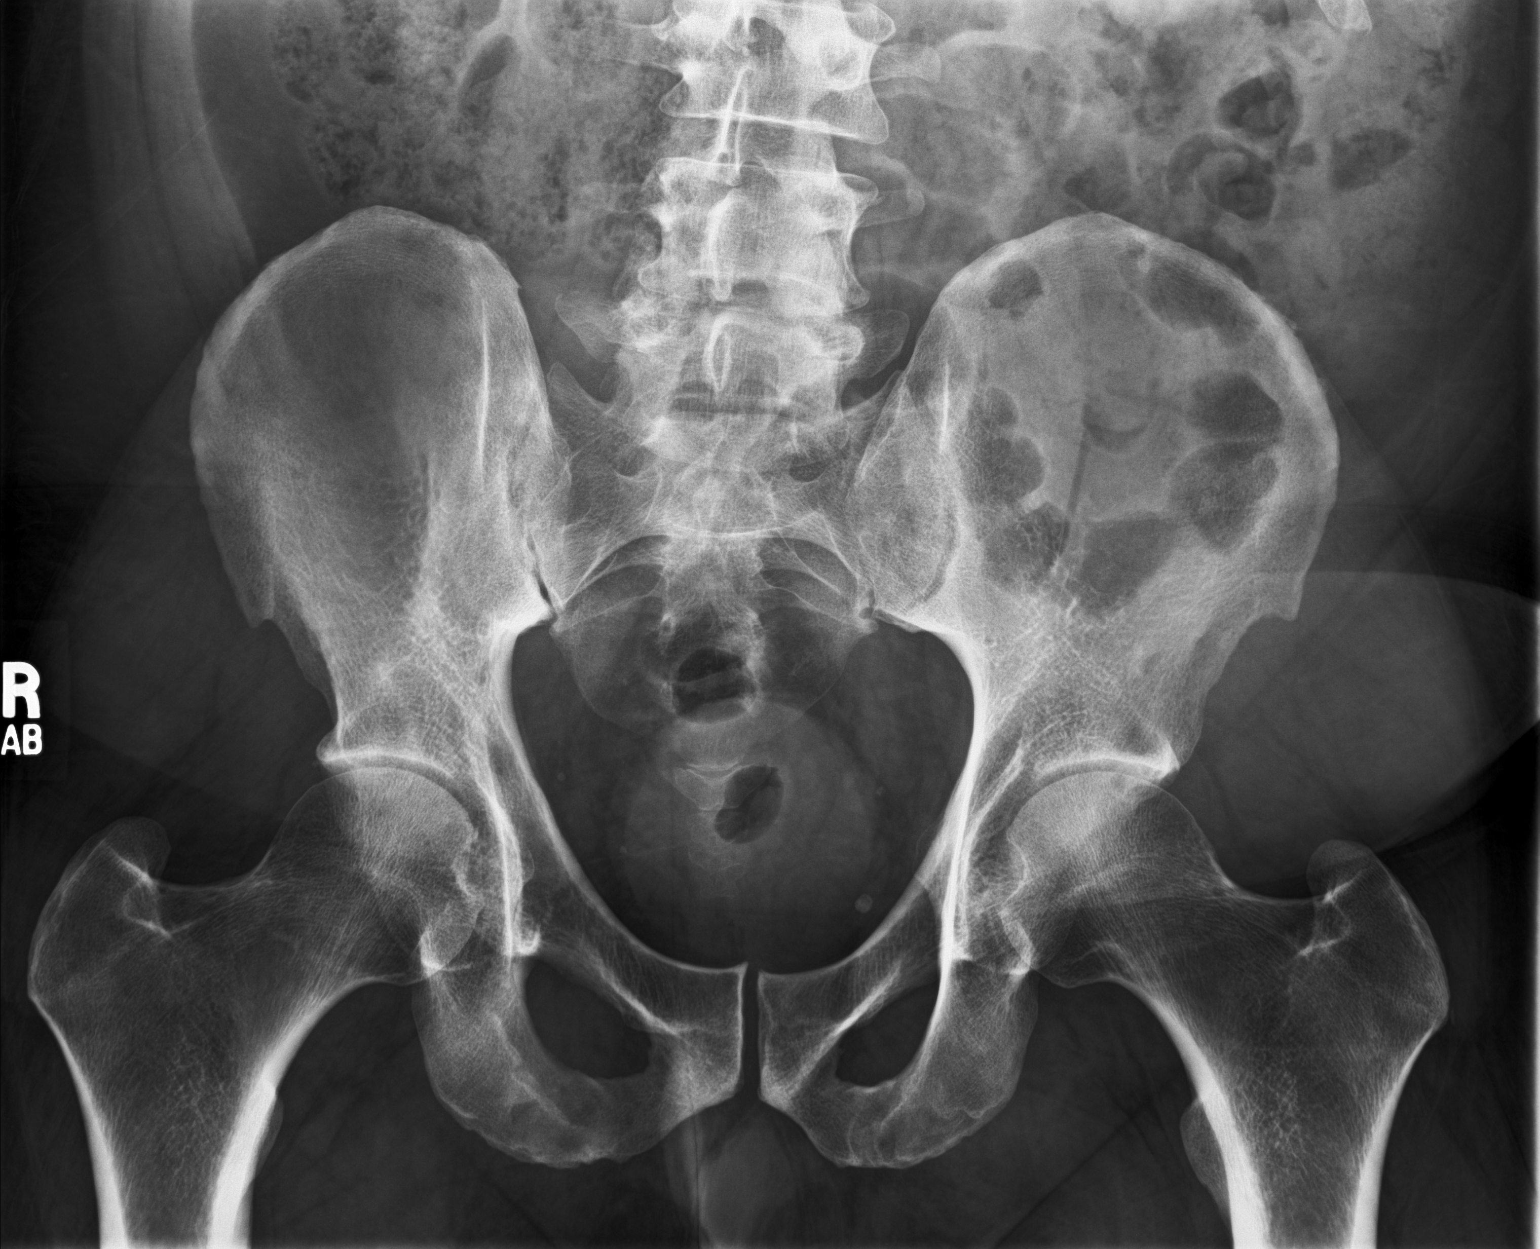

[hip ap]
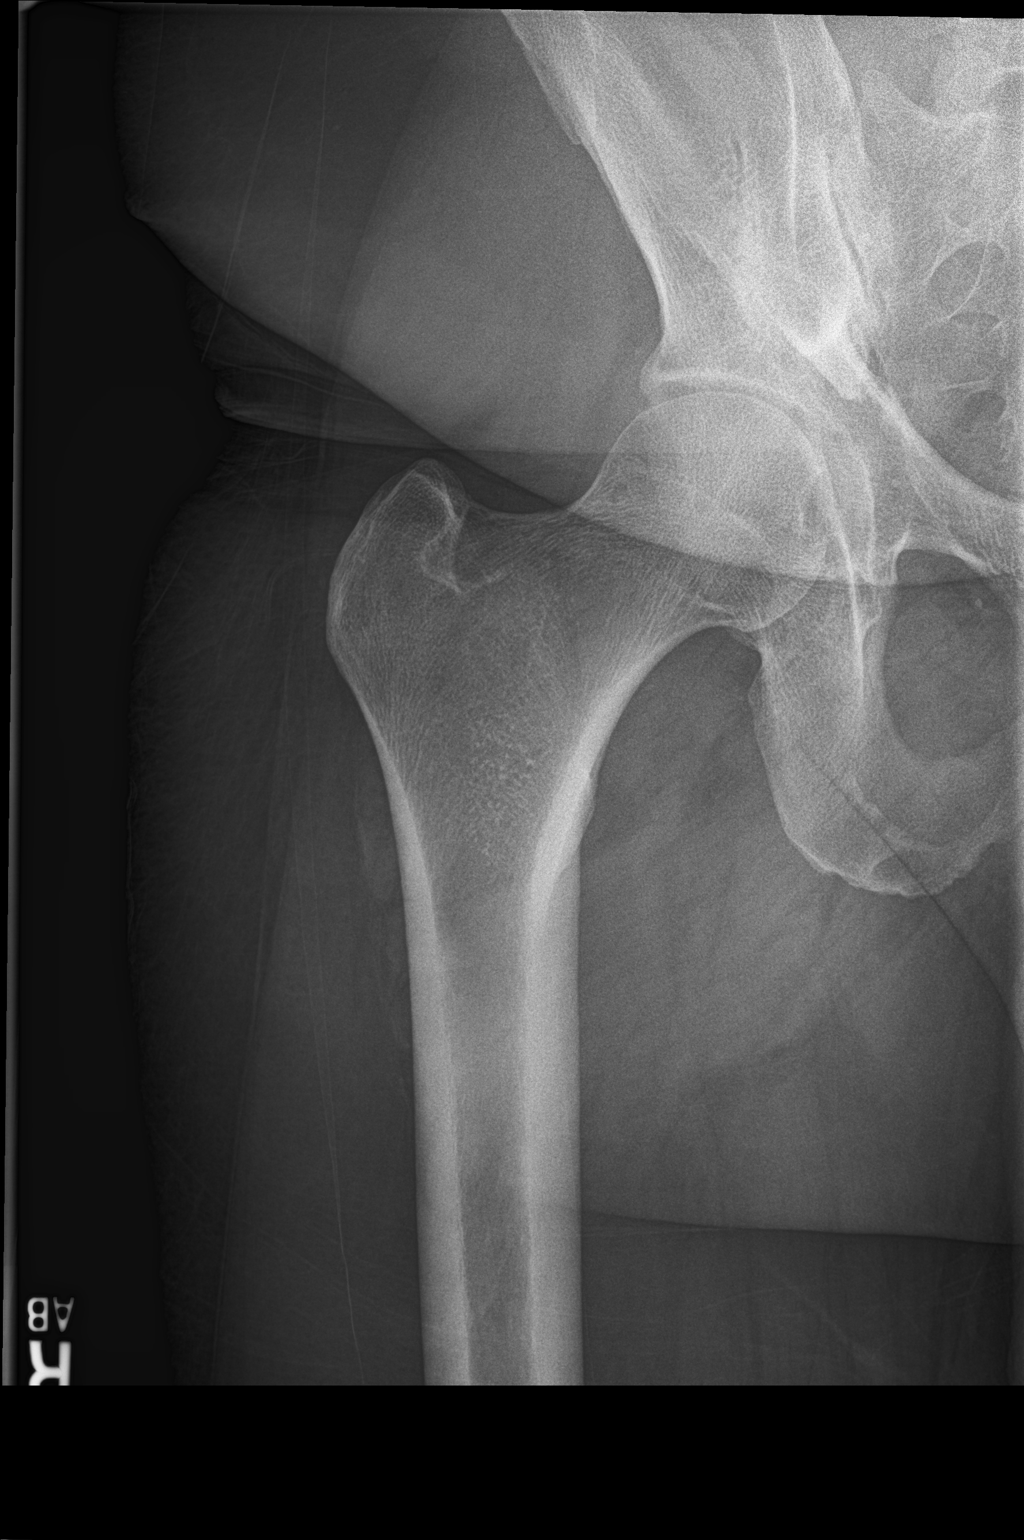

[hip frog leg]
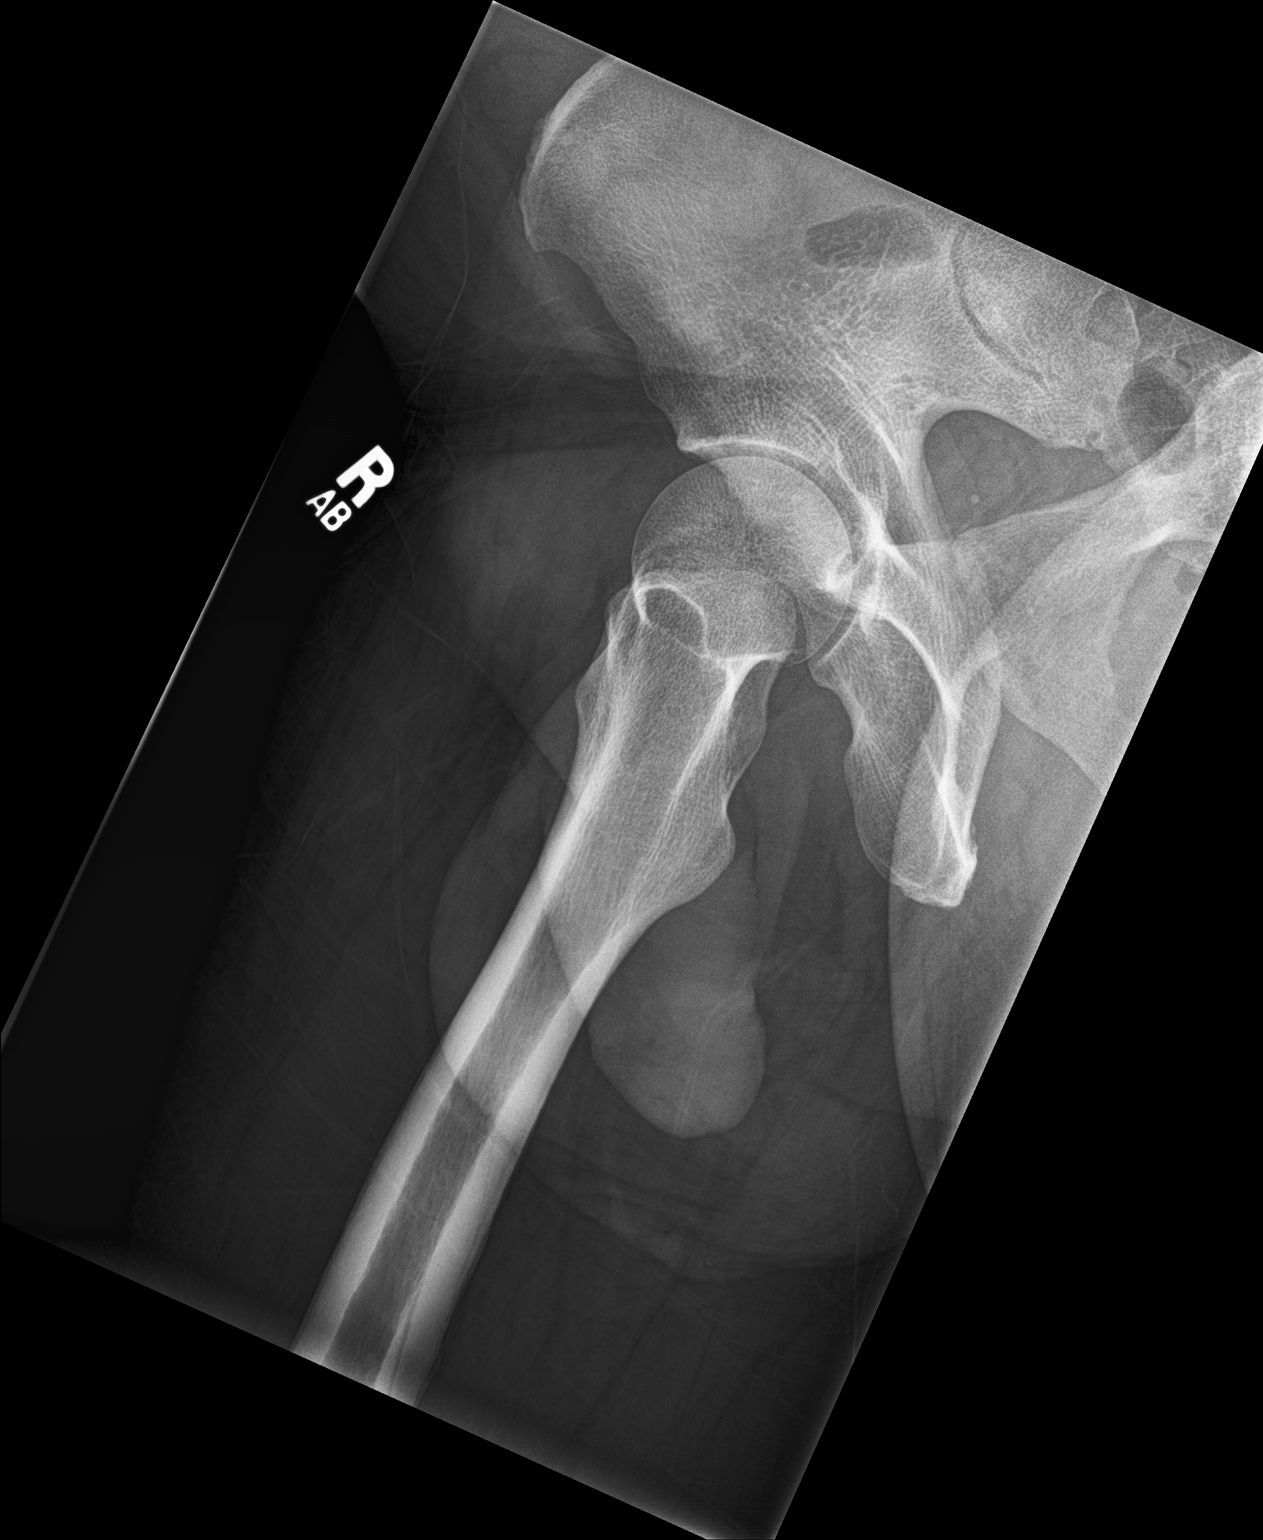

[3 of 3 positions shown; findings below may reference images not displayed]

FINDINGS: Mild left and mild-to-moderate right sacroiliitis. Mild bilateral
hip joint space narrowing without significant osteophyte formation
chest, with mild subchondral cystic change left femoral head. Pelvic
bones intact. No evidence of fracture or dislocation involving
proximal right femur.
IMPRESSION: No acute findings.  Degenerative changes as described.

## 2015-12-20 ENCOUNTER — Encounter: Payer: 59 | Admitting: Family Medicine

## 2016-01-09 ENCOUNTER — Ambulatory Visit (INDEPENDENT_AMBULATORY_CARE_PROVIDER_SITE_OTHER): Payer: 59 | Admitting: Family Medicine

## 2016-01-09 ENCOUNTER — Encounter: Payer: Self-pay | Admitting: Family Medicine

## 2016-01-09 VITALS — BP 116/78 | HR 84 | Temp 97.8°F | Resp 16 | Ht 71.0 in | Wt 267.9 lb

## 2016-01-09 DIAGNOSIS — Z0000 Encounter for general adult medical examination without abnormal findings: Secondary | ICD-10-CM | POA: Diagnosis not present

## 2016-01-09 DIAGNOSIS — R972 Elevated prostate specific antigen [PSA]: Secondary | ICD-10-CM | POA: Diagnosis not present

## 2016-01-09 DIAGNOSIS — E669 Obesity, unspecified: Secondary | ICD-10-CM

## 2016-01-09 NOTE — Progress Notes (Signed)
Name: Jeffrey Whitaker   MRN: [id number]    DOB: June 29, 1961   Date:01/09/2016       Progress Note  Subjective  Chief Complaint  Chief Complaint  Patient presents with  . Annual Exam    patient sleeps about 6-8 hrs/ night. eats about 2-3 meals/ day with the occasional snack. patient drinks plenty of water. patient stated that he does not have any problems at home or work.    HPI  Well Male Exam: he is feeling well, denies chest pain, palpitation. He has gained weight since last visit but will resume physical activity and healthy diet also   PSA elevated: his last PSA was 4.6, denies urinary symptoms, we will recheck level and if still elevated we will refer to Urologist for further evaluation.   Obesity: he has gained weight, states that he stopped his exercise routine in April but he just realized how much weight he has gained and will resume his diet and exercise. He states this week he resumed his 5 days week exercising and entering his diet to [employer]   Patient Active Problem List   Diagnosis Date Noted  . Allergic rhinitis, seasonal 04/15/2015  . Abnormal blood sugar 04/11/2015  . Benign hypertension 04/11/2015  . Dyslipidemia 04/11/2015  . Obesity (BMI 30.0-34.9) 04/11/2015  . Right medial knee pain 04/11/2015  . Metabolic syndrome 99991111  . Left shoulder pain 04/11/2015  . Umbilical hernia 99991111  . Microcytosis 04/11/2015    Past Surgical History  Procedure Laterality Date  . Replacement total knee    . Joint replacement      Family History  Problem Relation Age of Onset  . Hypertension Mother   . CAD Mother     Social History   Social History  . Marital Status: Married    Spouse Name: N/A  . Number of Children: N/A  . Years of Education: N/A   Occupational History  . Not on file.   Social History Main Topics  . Smoking status: Former Research scientist (life sciences)  . Smokeless tobacco: Former Systems developer    Quit date: 07/28/1993  . Alcohol Use: No  . Drug Use: No   . Sexual Activity: Yes   Other Topics Concern  . Not on file   Social History Narrative     Current outpatient prescriptions:  .  aspirin 81 MG chewable tablet, Chew 1 tablet by mouth daily., Disp: , Rfl:  .  fluticasone (FLONASE) 50 MCG/ACT nasal spray, Place 2 sprays into both nostrils daily., Disp: 16 g, Rfl: 0 .  Multiple Vitamin tablet, Take 1 tablet by mouth daily., Disp: , Rfl:  .  pravastatin (PRAVACHOL) 40 MG tablet, Take 1 tablet by mouth  daily, Disp: 90 tablet, Rfl: 1 .  quinapril-hydrochlorothiazide (ACCURETIC) 20-25 MG tablet, Take 1 tablet by mouth daily., Disp: 90 tablet, Rfl: 1  No Known Allergies   ROS  Constitutional: Negative for fever , positive for  weight change.  Respiratory: Negative for cough and shortness of breath.   Cardiovascular: Negative for chest pain or palpitations.  Gastrointestinal: Negative for abdominal pain, no bowel changes.  Musculoskeletal: Negative for gait problem or joint swelling.  Skin: Negative for rash.  Neurological: Negative for dizziness or headache.  No other specific complaints in a complete review of systems (except as listed in HPI above).  Objective  Filed Vitals:   01/09/16 0826  BP: 116/78  Pulse: 84  Temp: 97.8 F (36.6 C)  TempSrc: Oral  Resp: 16  Height:  5\' 11"  (1.803 m)  Weight: 267 lb 14.4 oz (121.519 kg)  SpO2: 99%    Body mass index is 37.38 kg/(m^2).  Physical Exam  Constitutional: Patient appears well-developed and well-nourished. No distress.  HENT: Head: Normocephalic and atraumatic. Ears: B TMs ok, no erythema or effusion; Nose: Nose normal. Mouth/Throat: Oropharynx is clear and moist. No oropharyngeal exudate.  Eyes: Conjunctivae and EOM are normal. Pupils are equal, round, and reactive to light. No scleral icterus.  Neck: Normal range of motion. Neck supple. No JVD present. No thyromegaly present.  Cardiovascular: Normal rate, regular rhythm and normal heart sounds.  No murmur heard. No  BLE edema. Pulmonary/Chest: Effort normal and breath sounds normal. No respiratory distress. Abdominal: Soft. Bowel sounds are normal, no distension. There is no tenderness. no masses MALE GENITALIA: Normal descended testes bilaterally, no masses palpated, no hernias, no lesions, no discharge RECTAL: Prostate normal size and consistency, no rectal masses or hemorrhoids Musculoskeletal: Normal range of motion, no joint effusions. No gross deformities Neurological: he is alert and oriented to person, place, and time. No cranial nerve deficit. Coordination, balance, strength, speech and gait are normal.  Skin: Skin is warm and dry. No rash noted. No erythema.  Psychiatric: Patient has a normal mood and affect. behavior is normal. Judgment and thought content normal.  Recent Results (from the past 2160 hour(s))  Lipid panel     Status: None   Collection Time: 10/15/15  9:04 AM  Result Value Ref Range   Cholesterol, Total 138 100 - 199 mg/dL   Triglycerides 77 0 - 149 mg/dL   HDL 48 >39 mg/dL   VLDL Cholesterol Cal 15 5 - 40 mg/dL   LDL Calculated 75 0 - 99 mg/dL   Chol/HDL Ratio 2.9 0.0 - 5.0 ratio units    Comment:                                   T. Chol/HDL Ratio                                             Men  Women                               1/2 Avg.Risk  3.4    3.3                                   Avg.Risk  5.0    4.4                                2X Avg.Risk  9.6    7.1                                3X Avg.Risk 23.4   11.0   PSA     Status: Abnormal   Collection Time: 10/15/15  9:04 AM  Result Value Ref Range   Prostate Specific Ag, Serum 4.9 (H) 0.0 - 4.0 ng/mL    Comment: Roche ECLIA methodology. According to the American Urological Association, Serum PSA should  decrease and remain at undetectable levels after radical prostatectomy. The AUA defines biochemical recurrence as an initial PSA value 0.2 ng/mL or greater followed by a subsequent confirmatory PSA value 0.2  ng/mL or greater. Values obtained with different assay methods or kits cannot be used interchangeably. Results cannot be interpreted as absolute evidence of the presence or absence of malignant disease.   VITAMIN D 25 Hydroxy (Vit-D Deficiency, Fractures)     Status: None   Collection Time: 10/15/15  9:04 AM  Result Value Ref Range   Vit D, 25-Hydroxy 43.7 30.0 - 100.0 ng/mL    Comment: Vitamin D deficiency has been defined by the [employer] practice guideline as a level of serum 25-OH vitamin D less than 20 ng/mL (1,2). The Endocrine Society went on to further define vitamin D insufficiency as a level between 21 and 29 ng/mL (2). 1. IOM (Institute of Medicine). 2010. Dietary reference    intakes for calcium and D. [place]: The    [employer]. 2. Holick MF, Binkley [place], Bischoff-Ferrari HA, et al.    Evaluation, treatment, and prevention of vitamin D    deficiency: an Endocrine Society clinical practice    guideline. JCEM. 2011 Jul; 96(7):1911-30.   Hemoglobin A1c     Status: Abnormal   Collection Time: 10/15/15  9:04 AM  Result Value Ref Range   Hgb A1c MFr Bld 6.0 (H) 4.8 - 5.6 %    Comment:          Pre-diabetes: 5.7 - 6.4          Diabetes: >6.4          Glycemic control for adults with diabetes: <7.0    Est. average glucose Bld gHb Est-mCnc 126 mg/dL  CBC with Differential/Platelet     Status: Abnormal   Collection Time: 10/15/15  9:04 AM  Result Value Ref Range   WBC 8.7 3.4 - 10.8 x10E3/uL   RBC 5.87 (H) 4.14 - 5.80 x10E6/uL   Hemoglobin 14.1 12.6 - 17.7 g/dL   Hematocrit 43.6 37.5 - 51.0 %   MCV 74 (L) 79 - 97 fL   MCH 24.0 (L) 26.6 - 33.0 pg   MCHC 32.3 31.5 - 35.7 g/dL   RDW 13.7 12.3 - 15.4 %   Platelets 272 150 - 379 x10E3/uL   Neutrophils 67 %   Lymphs 25 %   Monocytes 5 %   Eos 2 %   Basos 0 %   Neutrophils Absolute 5.8 1.4 - 7.0 x10E3/uL   Lymphocytes Absolute 2.2 0.7 - 3.1 x10E3/uL   Monocytes  Absolute 0.5 0.1 - 0.9 x10E3/uL   EOS (ABSOLUTE) 0.2 0.0 - 0.4 x10E3/uL   Basophils Absolute 0.0 0.0 - 0.2 x10E3/uL   Immature Granulocytes 1 %   Immature Grans (Abs) 0.0 0.0 - 0.1 x10E3/uL  Comprehensive metabolic panel     Status: Abnormal   Collection Time: 10/15/15  9:04 AM  Result Value Ref Range   Glucose 107 (H) 65 - 99 mg/dL   BUN 19 6 - 24 mg/dL   Creatinine, Ser 1.25 0.76 - 1.27 mg/dL   GFR calc non Af Amer 65 >59 mL/min/1.73   GFR calc Af Amer 75 >59 mL/min/1.73   BUN/Creatinine Ratio 15 9 - 20   Sodium 143 134 - 144 mmol/L   Potassium 4.5 3.5 - 5.2 mmol/L   Chloride 100 96 - 106 mmol/L   CO2 26 18 - 29 mmol/L  Calcium 9.9 8.7 - 10.2 mg/dL   Total Protein 7.5 6.0 - 8.5 g/dL   Albumin 4.5 3.5 - 5.5 g/dL   Globulin, Total 3.0 1.5 - 4.5 g/dL   Albumin/Globulin Ratio 1.5 1.2 - 2.2    Comment:               **Please note reference interval change**   Bilirubin Total 0.4 0.0 - 1.2 mg/dL   Alkaline Phosphatase 52 39 - 117 IU/L   AST 22 0 - 40 IU/L   ALT 22 0 - 44 IU/L     PHQ2/9: Depression screen [facility] 2/9 01/09/2016 10/15/2015 04/16/2015  Decreased Interest 0 0 0  Down, Depressed, Hopeless 0 0 0  PHQ - 2 Score 0 0 0    Fall Risk: Fall Risk  01/09/2016 10/15/2015 04/16/2015  Falls in the past year? No No No     Functional Status Survey: Is the patient deaf or have difficulty hearing?: No Does the patient have difficulty seeing, even when wearing glasses/contacts?: No Does the patient have difficulty concentrating, remembering, or making decisions?: No Does the patient have difficulty walking or climbing stairs?: No Does the patient have difficulty dressing or bathing?: No Does the patient have difficulty doing errands alone such as visiting a doctor's office or shopping?: No   IPSS Questionnaire (AUA-7):  Over the past month.   1)  How often have you had a sensation of not emptying your bladder completely after you finish urinating?  0 - Not at all  2)  How  often have you had to urinate again less than two hours after you finished urinating? 0 - Not at all  3)  How often have you found you stopped and started again several times when you urinated?  0 - Not at all  4) How difficult have you found it to postpone urination?  0 - Not at all  5) How often have you had a weak urinary stream?  1 - Less than 1 time in 5  6) How often have you had to push or strain to begin urination?  0 - Not at all  7) How many times did you most typically get up to urinate from the time you went to bed until the time you got up in the morning?  1 - 1 time  Total score:  0-7 mildly symptomatic   8-19 moderately symptomatic   20-35 severely symptomatic   Assessment & Plan  1. Encounter for routine history and physical exam for male Discussed importance of 150 minutes of physical activity weekly, eat two servings of fish weekly, eat one serving of tree nuts ( cashews, pistachios, pecans, almonds.Marland Kitchen) every other day, eat 6 servings of fruit/vegetables daily and drink plenty of water and avoid sweet beverages.   2. Elevated PSA  - PSA  3. Obesity (BMI 30.0-34.9)  He gained weight, but just resumed his physical activity regiment and also logging on [employer]itness[employer] again

## 2016-01-10 ENCOUNTER — Other Ambulatory Visit: Payer: Self-pay | Admitting: Family Medicine

## 2016-01-10 DIAGNOSIS — R972 Elevated prostate specific antigen [PSA]: Secondary | ICD-10-CM

## 2016-01-10 LAB — PSA: Prostate Specific Ag, Serum: 4.5 ng/mL — ABNORMAL HIGH (ref 0.0–4.0)

## 2016-01-30 ENCOUNTER — Ambulatory Visit: Payer: 59

## 2016-02-14 ENCOUNTER — Encounter: Payer: Self-pay | Admitting: Urology

## 2016-02-14 ENCOUNTER — Ambulatory Visit (INDEPENDENT_AMBULATORY_CARE_PROVIDER_SITE_OTHER): Payer: 59 | Admitting: Urology

## 2016-02-14 VITALS — BP 137/74 | HR 70 | Ht 71.0 in | Wt 268.0 lb

## 2016-02-14 DIAGNOSIS — N401 Enlarged prostate with lower urinary tract symptoms: Secondary | ICD-10-CM | POA: Diagnosis not present

## 2016-02-14 DIAGNOSIS — R972 Elevated prostate specific antigen [PSA]: Secondary | ICD-10-CM

## 2016-02-14 DIAGNOSIS — N138 Other obstructive and reflux uropathy: Secondary | ICD-10-CM

## 2016-02-14 NOTE — Progress Notes (Signed)
02/14/2016 3:07 PM   [name] 1961/02/07 [id number]  Referring provider: Steele Sizer, MD [address] [place], [place] [address]  Chief Complaint  Patient presents with  . Elevated PSA    referred by Dr. Burnis Kingfisher    HPI: Patient is a 55 year old African-American male who has an elevated PSA found by his primary care physician, Dr. Ancil Boozer, who is referred to [place] for further evaluation and management.  Patient's PSA has been found to be rising over the past year. One year ago the value was 3.2, 4 months ago it rose to 4.9 and 1 month ago decreased to 4.5.  He does not have a family history of prostate cancer.  His IPSS score today is 4, which is mild lower urinary tract symptomatology. He is pleased with his quality life due to his urinary symptoms.  His major complaint today is nocturia x 1.  He has had these symptoms for the last few years.  He denies any dysuria, hematuria or suprapubic pain.  He also denies any recent fevers, chills, nausea or vomiting.     IPSS      02/14/16 1400       International Prostate Symptom Score   How often have you had the sensation of not emptying your bladder? Not at All     How often have you had to urinate less than every two hours? Less than 1 in 5 times     How often have you found you stopped and started again several times when you urinated? Less than 1 in 5 times     How often have you found it difficult to postpone urination? Not at All     How often have you had a weak urinary stream? Less than 1 in 5 times     How often have you had to strain to start urination? Not at All     How many times did you typically get up at night to urinate? 1 Time     Total IPSS Score 4     Quality of Life due to urinary symptoms   If you were to spend the rest of your life with your urinary condition just the way it is now how would you feel about that? Pleased        Score:  1-7 Mild 8-19 Moderate 20-35 Severe   His SHIM  score is 23, which is no ED.   His major complaint is Decrease and the firmness of erections.  His libido is preserved.   His risk factors for ED are age, HLD, HTN, obesity, BPH and age.  He denies any painful erections or curvatures with his erections.        SHIM      02/14/16 1449       SHIM: Over the last 6 months:   How do you rate your confidence that you could get and keep an erection? High     When you had erections with sexual stimulation, how often were your erections hard enough for penetration (entering your partner)? Almost Always or Always     During sexual intercourse, how often were you able to maintain your erection after you had penetrated (entered) your partner? Not Difficult     During sexual intercourse, how difficult was it to maintain your erection to completion of intercourse? Not Difficult     When you attempted sexual intercourse, how often was it satisfactory for you? Slightly Difficult  SHIM Total Score   SHIM 23        Score: 1-7 Severe ED 8-11 Moderate ED 12-16 Mild-Moderate ED 17-21 Mild ED 22-25 No ED  PMH: Past Medical History  Diagnosis Date  . Hypertension   . Elevated PSA     Surgical History: Past Surgical History  Procedure Laterality Date  . Knee surgery      at age 46     Home Medications:    Medication List       This list is accurate as of: 02/14/16  3:07 PM.  Always use your most recent med list.               aspirin 81 MG chewable tablet  Chew 1 tablet by mouth daily.     fluticasone 50 MCG/ACT nasal spray  Commonly known as:  FLONASE  Place 2 sprays into both nostrils daily.     Multiple Vitamin tablet  Take 1 tablet by mouth daily.     pravastatin 40 MG tablet  Commonly known as:  PRAVACHOL  Take 1 tablet by mouth  daily     quinapril-hydrochlorothiazide 20-25 MG tablet  Commonly known as:  ACCURETIC  Take 1 tablet by mouth daily.        Allergies: No Known Allergies  Family History: Family  History  Problem Relation Age of Onset  . Hypertension Mother   . CAD Mother   . Kidney disease Neg Hx   . Prostate cancer Neg Hx     Social History:  reports that he has quit smoking. He quit smokeless tobacco use about 22 years ago. He reports that he does not drink alcohol or use illicit drugs.  ROS: UROLOGY Frequent Urination?: No Hard to postpone urination?: No Burning/pain with urination?: No Get up at night to urinate?: Yes Leakage of urine?: No Urine stream starts and stops?: No Trouble starting stream?: No Do you have to strain to urinate?: No Blood in urine?: No Urinary tract infection?: No Sexually transmitted disease?: No Injury to kidneys or bladder?: No Painful intercourse?: No Weak stream?: No Erection problems?: No Penile pain?: No  Gastrointestinal Nausea?: No Vomiting?: No Indigestion/heartburn?: No Diarrhea?: No Constipation?: No  Constitutional Fever: No Night sweats?: No Weight loss?: No Fatigue?: No  Skin Skin rash/lesions?: No Itching?: No  Eyes Blurred vision?: No Double vision?: No  Ears/Nose/Throat Sore throat?: No Sinus problems?: No  Hematologic/Lymphatic Swollen glands?: No Easy bruising?: No  Cardiovascular Leg swelling?: No Chest pain?: No  Respiratory Cough?: No Shortness of breath?: No  Endocrine Excessive thirst?: No  Musculoskeletal Back pain?: No Joint pain?: No  Neurological Headaches?: No Dizziness?: No  Psychologic Depression?: No Anxiety?: No  Physical Exam: BP 137/74 mmHg  Pulse 70  Ht 5\' 11"  (1.803 m)  Wt 268 lb (121.564 kg)  BMI 37.39 kg/m2  Constitutional: Well nourished. Alert and oriented, No acute distress. HEENT: [place] AT, moist mucus membranes. Trachea midline, no masses. Cardiovascular: No clubbing, cyanosis, or edema. Respiratory: Normal respiratory effort, no increased work of breathing. GI: Abdomen is soft, non tender, non distended, no abdominal masses. Liver and spleen not  palpable.  No hernias appreciated.  Stool sample for occult testing is not indicated.   GU: No CVA tenderness.  No bladder fullness or masses.  Patient with circumcised phallus.  Urethral meatus is patent.  No penile discharge. No penile lesions or rashes. Scrotum without lesions, cysts, rashes and/or edema.  Testicles are located scrotally bilaterally. No masses are appreciated  in the testicles. Left and right epididymis are normal. Rectal: Patient with  normal sphincter tone. Anus and perineum without scarring or rashes. No rectal masses are appreciated. Prostate is approximately 45 grams, calcifications noted in the right and left lobe,  no nodules are appreciated. Seminal vesicles are normal. Skin: No rashes, bruises or suspicious lesions. Lymph: No cervical or inguinal adenopathy. Neurologic: Grossly intact, no focal deficits, moving all 4 extremities. Psychiatric: Normal mood and affect.  Laboratory Data: Lab Results  Component Value Date   WBC 8.7 10/15/2015   HCT 43.6 10/15/2015   MCV 74* 10/15/2015   PLT 272 10/15/2015    Lab Results  Component Value Date   CREATININE 1.25 10/15/2015    Lab Results  Component Value Date   PSA 3.2 12/12/2014      Lab Results  Component Value Date   HGBA1C 6.0* 10/15/2015         Component Value Date/Time   CHOL 138 10/15/2015 0904   CHOL 149 10/10/2014   HDL 48 10/15/2015 0904   HDL 47 10/10/2014   CHOLHDL 2.9 10/15/2015 0904   LDLCALC 75 10/15/2015 0904   LDLCALC 76 10/10/2014    Lab Results  Component Value Date   AST 22 10/15/2015   Lab Results  Component Value Date   ALT 22 10/15/2015    Assessment & Plan:    1. Elevated PSA:   At this time, I have advised the patient that we will repeat the PSA to rule out lab error.  If that should return elevated, we could continue observation or pursue a prostate biopsy.  We discussed that indications for prostate biopsy are defined by age and race specific PSA cutoffs as well  as a PSA velocity of 0.75/year.  We decided to repeat the PSA today, and if it returns elevated, we will add a free and total PSA. Follow-up will be based on these findings.  2. BPH with LUTS  - IPSS score is 4/1  - Continue conservative management, avoiding bladder irritants   - RTC pending PSA results   Return for pending PSA.  These notes generated with voice recognition software. I apologize for typographical errors.  Zara Council, [facility] [address] [place], [place] [address] [phone]

## 2016-02-15 ENCOUNTER — Telehealth: Payer: Self-pay

## 2016-02-15 LAB — PSA: Prostate Specific Ag, Serum: 4.3 ng/mL — ABNORMAL HIGH (ref 0.0–4.0)

## 2016-02-15 NOTE — Telephone Encounter (Signed)
-----   Message from Nori Riis, PA-C sent at 02/15/2016  5:56 AM EDT ----- Would you please add a free and total PSA to his blood work?

## 2016-02-15 NOTE — Telephone Encounter (Signed)
Labs were added.

## 2016-02-16 LAB — SPECIMEN STATUS REPORT

## 2016-02-16 LAB — PSA, TOTAL AND FREE
PSA FREE PCT: 24.5 %
PSA FREE: 1.03 ng/mL
Prostate Specific Ag, Serum: 4.2 ng/mL — ABNORMAL HIGH (ref 0.0–4.0)

## 2016-02-21 ENCOUNTER — Telehealth: Payer: Self-pay

## 2016-02-21 DIAGNOSIS — Z79899 Other long term (current) drug therapy: Secondary | ICD-10-CM

## 2016-02-21 NOTE — Telephone Encounter (Signed)
Pt called requesting lab results. Please advise.  

## 2016-02-22 NOTE — Telephone Encounter (Signed)
I believe that I have artery spoken to the patient concerning his lab results. He will be returning in 1-3 months for a repeat morning testosterone level.

## 2016-03-10 NOTE — Telephone Encounter (Signed)
Patient called the office back today requesting lab results.  He indicated that he has not spoken to anyone from our office since his appointment on 7/20.  I provided his PSA results.  He is asking about his next step and when he should follow up.    There is nothing in his chart about testosterone levels and he seemed very confused when I mentioned that he should return for repeat morning testosterone level.  Can you please clarify and advise on his next steps?

## 2016-03-11 NOTE — Telephone Encounter (Signed)
PSA and testosterone.

## 2016-03-11 NOTE — Telephone Encounter (Signed)
Please apologize to the patient as I thought we had spoken about his results.  His repeated PSA did not change significantly and the additional blood work noted a 10% probability of prostate cancer.  I suggest repeated the study along with his morning testosterone before 10 AM in September.

## 2016-03-11 NOTE — Telephone Encounter (Signed)
Spoke with pt in reference to PSA results. Pt voiced understanding. What labs do you want pt to have?

## 2016-04-16 ENCOUNTER — Other Ambulatory Visit: Payer: Self-pay

## 2016-04-17 ENCOUNTER — Other Ambulatory Visit: Payer: 59

## 2016-04-17 ENCOUNTER — Encounter: Payer: Self-pay | Admitting: Family Medicine

## 2016-04-17 ENCOUNTER — Ambulatory Visit (INDEPENDENT_AMBULATORY_CARE_PROVIDER_SITE_OTHER): Payer: 59 | Admitting: Family Medicine

## 2016-04-17 VITALS — BP 108/62 | HR 90 | Temp 98.2°F | Resp 18 | Ht 71.0 in | Wt 252.4 lb

## 2016-04-17 DIAGNOSIS — R972 Elevated prostate specific antigen [PSA]: Secondary | ICD-10-CM | POA: Diagnosis not present

## 2016-04-17 DIAGNOSIS — E8881 Metabolic syndrome: Secondary | ICD-10-CM | POA: Diagnosis not present

## 2016-04-17 DIAGNOSIS — I10 Essential (primary) hypertension: Secondary | ICD-10-CM

## 2016-04-17 DIAGNOSIS — E669 Obesity, unspecified: Secondary | ICD-10-CM | POA: Diagnosis not present

## 2016-04-17 DIAGNOSIS — J302 Other seasonal allergic rhinitis: Secondary | ICD-10-CM

## 2016-04-17 DIAGNOSIS — E785 Hyperlipidemia, unspecified: Secondary | ICD-10-CM

## 2016-04-17 DIAGNOSIS — Z79899 Other long term (current) drug therapy: Secondary | ICD-10-CM

## 2016-04-17 MED ORDER — PRAVASTATIN SODIUM 40 MG PO TABS: ORAL_TABLET | ORAL | 1 refills | Status: DC

## 2016-04-17 MED ORDER — QUINAPRIL-HYDROCHLOROTHIAZIDE 20-25 MG PO TABS: 1.0000 | ORAL_TABLET | Freq: Every day | ORAL | 1 refills | Status: DC

## 2016-04-17 NOTE — Progress Notes (Signed)
Name: Jeffrey Whitaker   MRN: [id number]    DOB: 1960/11/20   Date:04/17/2016       Progress Note  Subjective  Chief Complaint  Chief Complaint  Patient presents with  . Follow-up    6 mnth follow up  . Medication Refill    HPI    Metabolic Syndrome: on diet and exercise, denies polyphagia, polyuria or polydipsia, last hgbA1C 6.0% - he has lost weight since and is likely better.  HTN: he is taking medication as prescribed, denies chest pain or palpitation. BP today is towards low end of normal, he denies orthostatic symptoms  Obesity: he has lost weight since last visit, he has a Physiological scientist , and is doing 30 minutes of cardiovascular exercise and also some weight training. He has lost 16 lbs since last visit.   AR: denies symptoms at this time, rhinorrhea or sneezing, only using nasal steroid as needed.   Elevated PSA: getting monitored by Urologist  Patient Active Problem List   Diagnosis Date Noted  . Allergic rhinitis, seasonal 04/15/2015  . Abnormal blood sugar 04/11/2015  . Benign hypertension 04/11/2015  . Dyslipidemia 04/11/2015  . Obesity (BMI 30.0-34.9) 04/11/2015  . Right medial knee pain 04/11/2015  . Metabolic syndrome 99991111  . Left shoulder pain 04/11/2015  . Umbilical hernia [id number]  . Microcytosis 04/11/2015    Past Surgical History:  Procedure Laterality Date  . KNEE SURGERY     at age 60     Family History  Problem Relation Age of Onset  . Hypertension Mother   . CAD Mother   . Kidney disease Neg Hx   . Prostate cancer Neg Hx     Social History   Social History  . Marital status: Married    Spouse name: N/A  . Number of children: N/A  . Years of education: N/A   Occupational History  . Not on file.   Social History Main Topics  . Smoking status: Former Research scientist (life sciences)  . Smokeless tobacco: Former Systems developer    Quit date: 07/28/1993  . Alcohol use No  . Drug use: No  . Sexual activity: Yes   Other Topics Concern  . Not on file    Social History Narrative  . No narrative on file     Current Outpatient Prescriptions:  .  aspirin 81 MG chewable tablet, Chew 1 tablet by mouth daily., Disp: , Rfl:  .  fluticasone (FLONASE) 50 MCG/ACT nasal spray, Place 2 sprays into both nostrils daily., Disp: 16 g, Rfl: 0 .  Multiple Vitamin tablet, Take 1 tablet by mouth daily., Disp: , Rfl:  .  pravastatin (PRAVACHOL) 40 MG tablet, Take 1 tablet by mouth  daily, Disp: 90 tablet, Rfl: 1 .  quinapril-hydrochlorothiazide (ACCURETIC) 20-25 MG tablet, Take 1 tablet by mouth daily., Disp: 90 tablet, Rfl: 1  No Known Allergies   ROS  Constitutional: Negative for fever or weight change.  Respiratory: Negative for cough and shortness of breath.   Cardiovascular: Negative for chest pain or palpitations.  Gastrointestinal: Negative for abdominal pain, no bowel changes.  Musculoskeletal: Negative for gait problem or joint swelling.  Skin: Negative for rash.  Neurological: Negative for dizziness or headache.  No other specific complaints in a complete review of systems (except as listed in HPI above).  Objective  Vitals:   04/17/16 0809  BP: 108/62  Pulse: 90  Resp: 18  Temp: 98.2 F (36.8 C)  TempSrc: Oral  SpO2: 97%  Weight: 252 lb  7 oz (114.5 kg)  Height: 5\' 11"  (1.803 m)    Body mass index is 35.21 kg/m.  Physical Exam  Constitutional: Patient appears well-developed and well-nourished. Obese No distress.  HEENT: head atraumatic, normocephalic, pupils equal and reactive to light,  neck supple, throat within normal limits Cardiovascular: Normal rate, regular rhythm and normal heart sounds.  No murmur heard. No BLE edema. Pulmonary/Chest: Effort normal and breath sounds normal. No respiratory distress. Abdominal: Soft.  There is no tenderness. Psychiatric: Patient has a normal mood and affect. behavior is normal. Judgment and thought content normal.   Recent Results (from the past 2160 hour(s))  PSA     Status:  Abnormal   Collection Time: 02/14/16  3:10 PM  Result Value Ref Range   Prostate Specific Ag, Serum 4.3 (H) 0.0 - 4.0 ng/mL    Comment: Roche ECLIA methodology. According to the American Urological Association, Serum PSA should decrease and remain at undetectable levels after radical prostatectomy. The AUA defines biochemical recurrence as an initial PSA value 0.2 ng/mL or greater followed by a subsequent confirmatory PSA value 0.2 ng/mL or greater. Values obtained with different assay methods or kits cannot be used interchangeably. Results cannot be interpreted as absolute evidence of the presence or absence of malignant disease.   PSA, total and free     Status: Abnormal   Collection Time: 02/14/16  3:10 PM  Result Value Ref Range   Prostate Specific Ag, Serum 4.2 (H) 0.0 - 4.0 ng/mL    Comment: Roche ECLIA methodology. According to the American Urological Association, Serum PSA should decrease and remain at undetectable levels after radical prostatectomy. The AUA defines biochemical recurrence as an initial PSA value 0.2 ng/mL or greater followed by a subsequent confirmatory PSA value 0.2 ng/mL or greater. Values obtained with different assay methods or kits cannot be used interchangeably. Results cannot be interpreted as absolute evidence of the presence or absence of malignant disease.    PSA, Free 1.03 N/A ng/mL    Comment: Roche ECLIA methodology.   PSA, Free Pct 24.5 %    Comment: The table below lists the probability of prostate cancer for men with non-suspicious DRE results and total PSA between 4 and 10 ng/mL, by patient age Ricci Barker, [place], [id number]).                   % Free PSA       50-64 yr        65-75 yr                   0.00-10.00%        56%             55%                  10.01-15.00%        24%             35%                  15.01-20.00%        17%             23%                  20.01-25.00%        10%             20%                        >  25.00%         5%              9% Please note:  Catalona et al did not make specific               recommendations regarding the use of               percent free PSA for any other population               of men.   Specimen status report     Status: None   Collection Time: 02/14/16  3:10 PM  Result Value Ref Range   specimen status report Comment     Comment: Written Authorization Written Authorization Written Authorization Received. Authorization received from [facility] LPN [id number] Logged by Adah Perl      PHQ2/9: Depression screen [facility] 2/9 04/17/2016 01/09/2016 10/15/2015 04/16/2015  Decreased Interest 0 0 0 0  Down, Depressed, Hopeless 0 0 0 0  PHQ - 2 Score 0 0 0 0    Fall Risk: Fall Risk  04/17/2016 01/09/2016 10/15/2015 04/16/2015  Falls in the past year? No No No No    Functional Status Survey: Is the patient deaf or have difficulty hearing?: No Does the patient have difficulty seeing, even when wearing glasses/contacts?: Yes Does the patient have difficulty concentrating, remembering, or making decisions?: No Does the patient have difficulty walking or climbing stairs?: No Does the patient have difficulty dressing or bathing?: No Does the patient have difficulty doing errands alone such as visiting a doctor's office or shopping?: No    Assessment & Plan  1. Benign hypertension  He states bp at home has been 120's/70's - quinapril-hydrochlorothiazide (ACCURETIC) 20-25 MG tablet; Take 1 tablet by mouth daily.  Dispense: 90 tablet; Refill: 1  2. Dyslipidemia  - pravastatin (PRAVACHOL) 40 MG tablet; Take 1 tablet by mouth  daily  Dispense: 90 tablet; Refill: 1  3. Metabolic syndrome  Losing weight, on a healthy diet now  4. Allergic rhinitis, seasonal  Doing well at this time, no rhinorrhea or nasal congestion    5. Elevated PSA  Getting monitored by Urologist   6. Obesity (BMI 30.0-34.9)  Continue life style modification

## 2016-04-18 LAB — PSA: Prostate Specific Ag, Serum: 4.5 ng/mL — ABNORMAL HIGH (ref 0.0–4.0)

## 2016-04-18 LAB — TESTOSTERONE: Testosterone: 400 ng/dL (ref 264–916)

## 2016-04-21 ENCOUNTER — Telehealth: Payer: Self-pay

## 2016-04-21 NOTE — Telephone Encounter (Signed)
-----   Message from Nori Riis, PA-C sent at 04/19/2016  1:36 PM EDT ----- Please add a free and total PSA to his lab work.

## 2016-04-21 NOTE — Telephone Encounter (Signed)
Labs were added.

## 2016-04-22 ENCOUNTER — Telehealth: Payer: Self-pay

## 2016-04-22 DIAGNOSIS — N400 Enlarged prostate without lower urinary tract symptoms: Secondary | ICD-10-CM

## 2016-04-22 NOTE — Telephone Encounter (Signed)
Spoke with pt in reference to PSA results. Pt voiced understanding. Pt was transferred to the front to make f/u and lab appt. Orders placed.

## 2016-04-22 NOTE — Telephone Encounter (Signed)
-----   Message from Nori Riis, PA-C sent at 04/22/2016  8:07 AM EDT ----- Patient's PSA is holding at around four.  According to the free and total PSA, he has a 5% probability of having prostate cancer.  I would like to see him in December for a PSA and exam.

## 2016-04-23 LAB — PSA, TOTAL AND FREE
PSA, Free Pct: 26.3 %
PSA, Free: 1.13 ng/mL
Prostate Specific Ag, Serum: 4.3 ng/mL — ABNORMAL HIGH (ref 0.0–4.0)

## 2016-04-23 LAB — SPECIMEN STATUS REPORT

## 2016-07-01 ENCOUNTER — Encounter: Payer: Self-pay | Admitting: Urology

## 2016-07-01 ENCOUNTER — Ambulatory Visit (INDEPENDENT_AMBULATORY_CARE_PROVIDER_SITE_OTHER): Payer: 59 | Admitting: Urology

## 2016-07-01 VITALS — BP 117/70 | HR 67 | Ht 71.0 in | Wt 255.1 lb

## 2016-07-01 DIAGNOSIS — N401 Enlarged prostate with lower urinary tract symptoms: Secondary | ICD-10-CM

## 2016-07-01 DIAGNOSIS — N138 Other obstructive and reflux uropathy: Secondary | ICD-10-CM | POA: Diagnosis not present

## 2016-07-01 DIAGNOSIS — R972 Elevated prostate specific antigen [PSA]: Secondary | ICD-10-CM | POA: Diagnosis not present

## 2016-07-01 DIAGNOSIS — N529 Male erectile dysfunction, unspecified: Secondary | ICD-10-CM | POA: Diagnosis not present

## 2016-07-01 NOTE — Progress Notes (Signed)
07/01/2016 8:50 AM   [name] 24-Jun-1961 [id number]  Referring provider: Steele Sizer, MD [address] [place], [place] [address]  Chief Complaint  Patient presents with  . Elevated PSA    4 month follow up   . Benign Prostatic Hypertrophy    HPI: Patient is a 55 year old African-American male who since today for a follow-up for elevated PSA, BPH with LUTS and erectile dysfunction.   Elevated PSA  Patient had an elevated PSA found by his primary care physician, Dr. Ancil Boozer, who is referred to [place] for further evaluation and management.  Patient's PSA has been found to be rising over the past year. One year ago the value was 3.2, 4 months ago it rose to 4.9 and 1 month ago decreased to 4.5.  He does not have a family history of prostate cancer.  His most current PSA was 4.3 in 03/2016.  BPH with LUTS His IPSS score today is 5, which is mild lower urinary tract symptomatology. He is delighted with his quality life due to his urinary symptoms.  His previous IPSS score was 4/2.  His major complaint today is nocturia x 2.  He has had these symptoms for the last few years.  He denies any dysuria, hematuria or suprapubic pain.  He also denies any recent fevers, chills, nausea or vomiting.     IPSS    Row Name 07/01/16 0800         International Prostate Symptom Score   How often have you had the sensation of not emptying your bladder? Not at All     How often have you had to urinate less than every two hours? Less than 1 in 5 times     How often have you found you stopped and started again several times when you urinated? Less than 1 in 5 times     How often have you found it difficult to postpone urination? Less than 1 in 5 times     How often have you had a weak urinary stream? Not at All     How often have you had to strain to start urination? Not at All     How many times did you typically get up at night to urinate? 2 Times     Total IPSS Score 5       Quality of Life due to urinary symptoms   If you were to spend the rest of your life with your urinary condition just the way it is now how would you feel about that? Delighted        Score:  1-7 Mild 8-19 Moderate 20-35 Severe  Erectile dysfunction His SHIM score is 23, which is no ED.   His previous SHIM score was 23.  His major complaint is decrease and the firmness of erections.  His libido is preserved.   His risk factors for ED are age, HLD, HTN, obesity, BPH and age.  He denies any painful erections or curvatures with his erections.        SHIM    Row Name 07/01/16 0844         SHIM: Over the last 6 months:   How do you rate your confidence that you could get and keep an erection? High     When you had erections with sexual stimulation, how often were your erections hard enough for penetration (entering your partner)? Almost Always or Always     During sexual intercourse, how  often were you able to maintain your erection after you had penetrated (entered) your partner? Not Difficult     During sexual intercourse, how difficult was it to maintain your erection to completion of intercourse? Not Difficult     When you attempted sexual intercourse, how often was it satisfactory for you? Slightly Difficult       SHIM Total Score   SHIM 23        Score: 1-7 Severe ED 8-11 Moderate ED 12-16 Mild-Moderate ED 17-21 Mild ED 22-25 No ED  PMH: Past Medical History:  Diagnosis Date  . Elevated PSA   . Hypertension     Surgical History: Past Surgical History:  Procedure Laterality Date  . KNEE SURGERY     at age 55     Home Medications:    Medication List       Accurate as of 07/01/16  8:50 AM. Always use your most recent med list.          aspirin 81 MG chewable tablet Chew 1 tablet by mouth daily.   fluticasone 50 MCG/ACT nasal spray Commonly known as:  FLONASE Place 2 sprays into both nostrils daily.   Multiple Vitamin tablet Take 1 tablet by mouth  daily.   pravastatin 40 MG tablet Commonly known as:  PRAVACHOL Take 1 tablet by mouth  daily   quinapril-hydrochlorothiazide 20-25 MG tablet Commonly known as:  ACCURETIC Take 1 tablet by mouth daily.       Allergies: No Known Allergies  Family History: Family History  Problem Relation Age of Onset  . Hypertension Mother   . CAD Mother   . Kidney disease Neg Hx   . Prostate cancer Neg Hx   . Bladder Cancer Neg Hx     Social History:  reports that he has quit smoking. He quit smokeless tobacco use about 28 years ago. He reports that he does not drink alcohol or use drugs.  ROS: UROLOGY Frequent Urination?: No Hard to postpone urination?: No Burning/pain with urination?: No Get up at night to urinate?: No Leakage of urine?: No Urine stream starts and stops?: No Trouble starting stream?: No Do you have to strain to urinate?: No Blood in urine?: No Urinary tract infection?: No Sexually transmitted disease?: No Injury to kidneys or bladder?: No Painful intercourse?: No Weak stream?: No Erection problems?: No Penile pain?: No  Gastrointestinal Nausea?: No Vomiting?: No Indigestion/heartburn?: No Diarrhea?: No Constipation?: No  Constitutional Fever: No Night sweats?: No Weight loss?: No Fatigue?: No  Skin Skin rash/lesions?: No Itching?: No  Eyes Blurred vision?: No Double vision?: No  Ears/Nose/Throat Sore throat?: No Sinus problems?: No  Hematologic/Lymphatic Swollen glands?: No Easy bruising?: No  Cardiovascular Leg swelling?: No Chest pain?: No  Respiratory Cough?: No Shortness of breath?: No  Endocrine Excessive thirst?: No  Musculoskeletal Back pain?: No Joint pain?: No  Neurological Headaches?: No Dizziness?: No  Psychologic Depression?: No Anxiety?: No  Physical Exam: BP 117/70   Pulse 67   Ht 5\' 11"  (1.803 m)   Wt 255 lb 1.6 oz (115.7 kg)   BMI 35.58 kg/m   Constitutional: Well nourished. Alert and oriented,  No acute distress. HEENT:  AT, moist mucus membranes. Trachea midline, no masses. Cardiovascular: No clubbing, cyanosis, or edema. Respiratory: Normal respiratory effort, no increased work of breathing. GI: Abdomen is soft, non tender, non distended, no abdominal masses. Liver and spleen not palpable.  No hernias appreciated.  Stool sample for occult testing is not indicated.  GU: No CVA tenderness.  No bladder fullness or masses.  Patient with circumcised phallus.  Urethral meatus is patent.  No penile discharge. No penile lesions or rashes. Scrotum without lesions, cysts, rashes and/or edema.  Testicles are located scrotally bilaterally. No masses are appreciated in the testicles. Left and right epididymis are normal. Rectal: Patient with  normal sphincter tone. Anus and perineum without scarring or rashes. No rectal masses are appreciated. Prostate is approximately 45 grams, calcifications noted in the right and left lobe,  no nodules are appreciated. Seminal vesicles are normal. Skin: No rashes, bruises or suspicious lesions. Lymph: No cervical or inguinal adenopathy. Neurologic: Grossly intact, no focal deficits, moving all 4 extremities. Psychiatric: Normal mood and affect.  Laboratory Data: Lab Results  Component Value Date   WBC 8.7 10/15/2015   HCT 43.6 10/15/2015   MCV 74 (L) 10/15/2015   PLT 272 10/15/2015    Lab Results  Component Value Date   CREATININE 1.25 10/15/2015    Lab Results  Component Value Date   PSA 3.2 12/12/2014  PSA's   4.9 ng/mL on 10/15/2015  4.5 ng/mL on 01/09/2016  4.2 ng/mL on 02/14/2016  4.3 ng/mL on 04/17/2016   Percentage of free PSA was 26.3 on 04/17/2016   Free PSA was 1.13 ng/mL on 04/17/2016    Lab Results  Component Value Date   HGBA1C 6.0 (H) 10/15/2015         Component Value Date/Time   CHOL 138 10/15/2015 0904   HDL 48 10/15/2015 0904   CHOLHDL 2.9 10/15/2015 0904   LDLCALC 75 10/15/2015 0904    Lab Results    Component Value Date   AST 22 10/15/2015   Lab Results  Component Value Date   ALT 22 10/15/2015    Assessment & Plan:    1. Elevated PSA  - PSA's have been ~ 4 for almost one year  - PSA drawn today  - continue with biannual PSA's  2. BPH with LUTS  - IPSS score is 5/0, it is stable  - Continue conservative management, avoiding bladder irritants and timed voiding's  - RTC in 6 months for IPSS, PSA and exam   3. Erectile dysfunction  - SHIM score is 23, it is stable  - I explained to the patient that in order to achieve an erection it takes good functioning of the nervous system (parasympathetic, sympathetic, sensory and motor), good blood flow into the erectile tissue of the penis and a desire to have sex  - I explained that conditions like diabetes, hypertension, coronary artery disease, peripheral vascular disease, smoking, alcohol consumption, age, sleep apnea and BPH can diminish the ability to have an erection  - Continue healthy eating and exercise  - RTC in 6 months for repeat SHIM score and exam   Return in about 6 months (around 12/30/2016) for IPSS, SHIM, PSA and exam.  These notes generated with voice recognition software. I apologize for typographical errors.  Zara Council, [facility] [address], [place], [place] [address] [phone]

## 2016-07-02 ENCOUNTER — Telehealth: Payer: Self-pay

## 2016-07-02 LAB — PSA: PROSTATE SPECIFIC AG, SERUM: 4.5 ng/mL — AB (ref 0.0–4.0)

## 2016-07-02 NOTE — Telephone Encounter (Signed)
Spoke with pt in reference to PSA results. Pt voiced understanding.  

## 2016-07-02 NOTE — Telephone Encounter (Signed)
-----   Message from Nori Riis, PA-C sent at 07/02/2016  8:11 AM EST ----- Please notify the patient that his PSA is stable at 4.5.  We will see him in 6 months.

## 2016-10-17 ENCOUNTER — Encounter: Payer: Self-pay | Admitting: Family Medicine

## 2016-10-17 ENCOUNTER — Telehealth: Payer: Self-pay | Admitting: Family Medicine

## 2016-10-17 ENCOUNTER — Ambulatory Visit (INDEPENDENT_AMBULATORY_CARE_PROVIDER_SITE_OTHER): Payer: 59 | Admitting: Family Medicine

## 2016-10-17 VITALS — BP 118/76 | HR 72 | Temp 97.5°F | Resp 16 | Ht 71.0 in | Wt 248.4 lb

## 2016-10-17 DIAGNOSIS — I10 Essential (primary) hypertension: Secondary | ICD-10-CM

## 2016-10-17 DIAGNOSIS — E785 Hyperlipidemia, unspecified: Secondary | ICD-10-CM

## 2016-10-17 DIAGNOSIS — E669 Obesity, unspecified: Secondary | ICD-10-CM

## 2016-10-17 DIAGNOSIS — E8881 Metabolic syndrome: Secondary | ICD-10-CM

## 2016-10-17 DIAGNOSIS — R972 Elevated prostate specific antigen [PSA]: Secondary | ICD-10-CM | POA: Diagnosis not present

## 2016-10-17 DIAGNOSIS — E66811 Obesity, class 1: Secondary | ICD-10-CM

## 2016-10-17 MED ORDER — QUINAPRIL-HYDROCHLOROTHIAZIDE 20-25 MG PO TABS: 1.0000 | ORAL_TABLET | Freq: Every day | ORAL | 1 refills | Status: DC

## 2016-10-17 MED ORDER — PRAVASTATIN SODIUM 40 MG PO TABS: ORAL_TABLET | ORAL | 1 refills | Status: DC

## 2016-10-17 NOTE — Progress Notes (Addendum)
Name: Jeffrey Whitaker   MRN: [id number]    DOB: 1961/05/20   Date:10/17/2016       Progress Note  Subjective  Chief Complaint  Chief Complaint  Patient presents with  . Hypertension    no problems at this time  . metabolic syndrome  . Obesity    patient has lost about 7lbs. he has made some lifestyle changes  . Elevated PSA  . Allergic Rhinitis     well controlled  . Medication Refill    HPI  Metabolic Syndrome: on diet and exercise, denies polyphagia, polyuria or polydipsia, last hgbA1C 6.0% - he continues to lose some weight. We will recheck today.   HTN: he is taking medication as prescribed, denies chest pain or palpitation.   Obesity: he has lost few pounds since last visit. He is training for another hike this Spring.   AR: denies symptoms at this time, rhinorrhea or sneezing, only using nasal steroid as needed.   Elevated PSA: getting monitored by Urologist. He has BPH but not on medication, last score was 5   Patient Active Problem List   Diagnosis Date Noted  . Allergic rhinitis, seasonal 04/15/2015  . Abnormal blood sugar 04/11/2015  . Benign hypertension 04/11/2015  . Dyslipidemia 04/11/2015  . Obesity (BMI 30.0-34.9) 04/11/2015  . Right medial knee pain 04/11/2015  . Metabolic syndrome 00/86/7619  . Left shoulder pain 04/11/2015  . Umbilical hernia 50/93/2671  . Microcytosis 04/11/2015    Past Surgical History:  Procedure Laterality Date  . KNEE SURGERY     at age 33     Family History  Problem Relation Age of Onset  . Hypertension Mother   . CAD Mother   . Kidney disease Neg Hx   . Prostate cancer Neg Hx   . Bladder Cancer Neg Hx     Social History   Social History  . Marital status: Married    Spouse name: N/A  . Number of children: N/A  . Years of education: N/A   Occupational History  . Not on file.   Social History Main Topics  . Smoking status: Former Research scientist (life sciences)  . Smokeless tobacco: Former Systems developer    Quit date: 07/29/1987  .  Alcohol use No  . Drug use: No  . Sexual activity: Yes    Partners: Female   Other Topics Concern  . Not on file   Social History Narrative  . No narrative on file     Current Outpatient Prescriptions:  .  aspirin 81 MG chewable tablet, Chew 1 tablet by mouth daily., Disp: , Rfl:  .  Multiple Vitamin tablet, Take 1 tablet by mouth daily., Disp: , Rfl:  .  pravastatin (PRAVACHOL) 40 MG tablet, Take 1 tablet by mouth  daily, Disp: 90 tablet, Rfl: 1 .  quinapril-hydrochlorothiazide (ACCURETIC) 20-25 MG tablet, Take 1 tablet by mouth daily., Disp: 90 tablet, Rfl: 1  No Known Allergies   ROS  Constitutional: Negative for fever or weight change.  Respiratory: Negative for cough and shortness of breath.   Cardiovascular: Negative for chest pain or palpitations.  Gastrointestinal: Negative for abdominal pain, no bowel changes.  Musculoskeletal: Negative for gait problem or joint swelling.  Skin: Negative for rash.  Neurological: Negative for dizziness or headache.  No other specific complaints in a complete review of systems (except as listed in HPI above).  Objective  Vitals:   10/17/16 0753  BP: 118/76  Pulse: 72  Resp: 16  Temp: 97.5 F (36.4  C)  TempSrc: Oral  SpO2: 97%  Weight: 248 lb 6.4 oz (112.7 kg)  Height: 5\' 11"  (1.803 m)    Body mass index is 34.64 kg/m.  Physical Exam  Constitutional: Patient appears well-developed and well-nourished. Obese  No distress.  HEENT: head atraumatic, normocephalic, pupils equal and reactive to light, neck supple, throat within normal limits Cardiovascular: Normal rate, regular rhythm and normal heart sounds.  No murmur heard. No BLE edema. Pulmonary/Chest: Effort normal and breath sounds normal. No respiratory distress. Abdominal: Soft.  There is no tenderness. Psychiatric: Patient has a normal mood and affect. behavior is normal. Judgment and thought content normal.  PHQ2/9: Depression screen [facility] 2/9 10/17/2016 04/17/2016  01/09/2016 10/15/2015 04/16/2015  Decreased Interest 0 0 0 0 0  Down, Depressed, Hopeless 0 0 0 0 0  PHQ - 2 Score 0 0 0 0 0     Fall Risk: Fall Risk  10/17/2016 04/17/2016 01/09/2016 10/15/2015 04/16/2015  Falls in the past year? No No No No No     Functional Status Survey: Is the patient deaf or have difficulty hearing?: No Does the patient have difficulty seeing, even when wearing glasses/contacts?: No Does the patient have difficulty concentrating, remembering, or making decisions?: No Does the patient have difficulty walking or climbing stairs?: No Does the patient have difficulty dressing or bathing?: No Does the patient have difficulty doing errands alone such as visiting a doctor's office or shopping?: No   Assessment & Plan  1. Benign hypertension  - quinapril-hydrochlorothiazide (ACCURETIC) 20-25 MG tablet; Take 1 tablet by mouth daily.  Dispense: 90 tablet; Refill: 1 - Comp. Metabolic Panel (12)  2. Dyslipidemia  - pravastatin (PRAVACHOL) 40 MG tablet; Take 1 tablet by mouth  daily  Dispense: 90 tablet; Refill: 1 - Lipid panel  3. Metabolic syndrome  Discussed life style modification  - Insulin, 2 Hour - Hemoglobin A1c  4. Elevated PSA  Continue follow up  5. Obesity (BMI 30.0-34.9)  Discussed with the patient the risk posed by an increased BMI. Discussed importance of portion control, calorie counting and at least 150 minutes of physical activity weekly. Avoid sweet beverages and drink more water. Eat at least 6 servings of fruit and vegetables daily

## 2016-10-17 NOTE — Addendum Note (Signed)
Addended by: Inda Coke on: 10/17/2016 08:21 AM   Modules accepted: Orders

## 2016-10-17 NOTE — Telephone Encounter (Signed)
It was not a glucose test, it was 2 hour fasting insulin. It can be added to his labs

## 2016-10-17 NOTE — Telephone Encounter (Signed)
Left message

## 2016-10-17 NOTE — Telephone Encounter (Signed)
FYI: pt did his regular lab work but did not have time to stay to do the 2 hour glucose testing. Stated that he will come back at different time to do it.

## 2016-10-18 LAB — COMPREHENSIVE METABOLIC PANEL
ALT: 24 IU/L (ref 0–44)
AST: 23 IU/L (ref 0–40)
Albumin/Globulin Ratio: 1.7 (ref 1.2–2.2)
Albumin: 4.5 g/dL (ref 3.5–5.5)
Alkaline Phosphatase: 41 IU/L (ref 39–117)
BUN/Creatinine Ratio: 16 (ref 9–20)
BUN: 20 mg/dL (ref 6–24)
Bilirubin Total: 0.6 mg/dL (ref 0.0–1.2)
CO2: 25 mmol/L (ref 18–29)
Calcium: 9.7 mg/dL (ref 8.7–10.2)
Chloride: 99 mmol/L (ref 96–106)
Creatinine, Ser: 1.27 mg/dL (ref 0.76–1.27)
GFR calc Af Amer: 73 mL/min/{1.73_m2} (ref >59)
GFR calc non Af Amer: 63 mL/min/{1.73_m2} (ref >59)
Globulin, Total: 2.7 g/dL (ref 1.5–4.5)
Glucose: 93 mg/dL (ref 65–99)
Potassium: 4.7 mmol/L (ref 3.5–5.2)
Sodium: 140 mmol/L (ref 134–144)
Total Protein: 7.2 g/dL (ref 6.0–8.5)

## 2016-10-18 LAB — LIPID PANEL
Chol/HDL Ratio: 2.6 ratio units (ref 0.0–5.0)
Cholesterol, Total: 135 mg/dL (ref 100–199)
Cholesterol: 135 mg/dL (ref 0–200)
HDL: 51 mg/dL (ref >39)
LDL Calculated: 74 mg/dL (ref 0–99)
Triglycerides: 49 mg/dL (ref 0–149)
VLDL Cholesterol Cal: 10 mg/dL (ref 5–40)

## 2016-10-18 LAB — HEMOGLOBIN A1C
Est. average glucose Bld gHb Est-mCnc: 120 mg/dL
HEMOGLOBIN A1C: 5.8 % — AB (ref 4.8–5.6)

## 2016-10-18 LAB — BASIC METABOLIC PANEL
BUN: 20 mg/dL (ref 4–21)
CREATININE: 1.3 mg/dL (ref 0.6–1.3)
Glucose: 93 mg/dL

## 2016-10-29 ENCOUNTER — Other Ambulatory Visit: Payer: Self-pay | Admitting: Family Medicine

## 2016-12-05 ENCOUNTER — Other Ambulatory Visit: Payer: Self-pay | Admitting: Family Medicine

## 2016-12-05 DIAGNOSIS — I10 Essential (primary) hypertension: Secondary | ICD-10-CM

## 2016-12-05 DIAGNOSIS — E785 Hyperlipidemia, unspecified: Secondary | ICD-10-CM

## 2016-12-25 ENCOUNTER — Other Ambulatory Visit: Payer: Self-pay

## 2016-12-25 DIAGNOSIS — R972 Elevated prostate specific antigen [PSA]: Secondary | ICD-10-CM

## 2016-12-26 ENCOUNTER — Other Ambulatory Visit: Payer: 59

## 2016-12-26 DIAGNOSIS — R972 Elevated prostate specific antigen [PSA]: Secondary | ICD-10-CM | POA: Diagnosis not present

## 2016-12-27 LAB — PSA: Prostate Specific Ag, Serum: 4.8 ng/mL — ABNORMAL HIGH (ref 0.0–4.0)

## 2016-12-29 NOTE — Progress Notes (Signed)
12/30/2016 3:36 PM   Jeffrey Whitaker 1960/08/05 [id number]  Referring provider: Steele Sizer, MD [address] [place], [place] [address]  Chief Complaint  Patient presents with  . Benign Prostatic Hypertrophy    6 month follow up   . Erectile Dysfunction    HPI: Patient is a 56 year old African-American male who since today for a follow-up for elevated PSA, BPH with LUTS and erectile dysfunction.   Elevated PSA  Patient had an elevated PSA found by his primary care physician, Dr. Ancil Boozer, who is referred to [place] for further evaluation and management.  Patient's PSA has been found to be rising over the past year. One year ago the value was 3.2, 4 months ago it rose to 4.9 and 1 month ago decreased to 4.5.  He does not have a family history of prostate cancer.  His most current PSA was 4.8 in 12/26/2016.    BPH with LUTS His IPSS score today is 4, which is mild lower urinary tract symptomatology.  He is pleased with his quality life due to his urinary symptoms.  His previous IPSS score was 5/0.  His major complaint today is nocturia x 2.   He has had these symptoms for the last few years.  He denies any dysuria, hematuria or suprapubic pain.   He also denies any recent fevers, chills, nausea or vomiting.     IPSS    Row Name 12/30/16 1500         International Prostate Symptom Score   How often have you had the sensation of not emptying your bladder? Not at All     How often have you had to urinate less than every two hours? Less than 1 in 5 times     How often have you found you stopped and started again several times when you urinated? Not at All     How often have you found it difficult to postpone urination? Less than 1 in 5 times     How often have you had a weak urinary stream? Less than 1 in 5 times     How often have you had to strain to start urination? Not at All     How many times did you typically get up at night to urinate? 1 Time     Total IPSS  Score 4       Quality of Life due to urinary symptoms   If you were to spend the rest of your life with your urinary condition just the way it is now how would you feel about that? Pleased        Score:  1-7 Mild 8-19 Moderate 20-35 Severe  Erectile dysfunction His SHIM score is 25, which is no ED.   His previous SHIM score was 23.  His major complaint is decrease and the firmness of erections.  His libido is preserved.   His risk factors for ED are age, HLD, HTN, obesity, BPH and age.  He denies any painful erections or curvatures with his erections.        SHIM    Row Name 12/30/16 1502         SHIM: Over the last 6 months:   How do you rate your confidence that you could get and keep an erection? Very High     When you had erections with sexual stimulation, how often were your erections hard enough for penetration (entering your partner)? Almost Always or Always  During sexual intercourse, how often were you able to maintain your erection after you had penetrated (entered) your partner? Almost Always or Always     During sexual intercourse, how difficult was it to maintain your erection to completion of intercourse? Not Difficult     When you attempted sexual intercourse, how often was it satisfactory for you? Almost Always or Always       SHIM Total Score   SHIM 25        Score: 1-7 Severe ED 8-11 Moderate ED 12-16 Mild-Moderate ED 17-21 Mild ED 22-25 No ED  PMH: Past Medical History:  Diagnosis Date  . Elevated PSA   . Hypertension     Surgical History: Past Surgical History:  Procedure Laterality Date  . KNEE SURGERY     at age 43     Home Medications:  Allergies as of 12/30/2016   No Known Allergies     Medication List       Accurate as of 12/30/16  3:36 PM. Always use your most recent med list.          aspirin 81 MG chewable tablet Chew 1 tablet by mouth daily.   Multiple Vitamin tablet Take 1 tablet by mouth daily.   pravastatin 40 MG  tablet Commonly known as:  PRAVACHOL Take 1 tablet by mouth  daily   quinapril-hydrochlorothiazide 20-25 MG tablet Commonly known as:  ACCURETIC Take 1 tablet by mouth daily.       Allergies: No Known Allergies  Family History: Family History  Problem Relation Age of Onset  . Hypertension Mother   . CAD Mother   . Benign prostatic hyperplasia Maternal Uncle   . Kidney disease Neg Hx   . Prostate cancer Neg Hx   . Bladder Cancer Neg Hx   . Kidney cancer Neg Hx     Social History:  reports that he has quit smoking. He quit smokeless tobacco use about 29 years ago. He reports that he does not drink alcohol or use drugs.  ROS: UROLOGY Frequent Urination?: No Hard to postpone urination?: No Burning/pain with urination?: No Get up at night to urinate?: No Leakage of urine?: No Urine stream starts and stops?: No Trouble starting stream?: No Do you have to strain to urinate?: No Blood in urine?: No Urinary tract infection?: No Sexually transmitted disease?: No Injury to kidneys or bladder?: No Painful intercourse?: No Weak stream?: No Erection problems?: No Penile pain?: No  Gastrointestinal Nausea?: No Vomiting?: No Indigestion/heartburn?: No Diarrhea?: No Constipation?: No  Constitutional Fever: No Night sweats?: No Weight loss?: No Fatigue?: No  Skin Skin rash/lesions?: No Itching?: No  Eyes Blurred vision?: No Double vision?: No  Ears/Nose/Throat Sore throat?: No Sinus problems?: No  Hematologic/Lymphatic Swollen glands?: No Easy bruising?: No  Cardiovascular Leg swelling?: No Chest pain?: No  Respiratory Cough?: No Shortness of breath?: No  Endocrine Excessive thirst?: No  Musculoskeletal Back pain?: No Joint pain?: No  Neurological Headaches?: No Dizziness?: No  Psychologic Depression?: No Anxiety?: No  Physical Exam: BP 138/84   Pulse 62   Ht 5\' 11"  (1.803 m)   Wt 248 lb 6.4 oz (112.7 kg)   BMI 34.64 kg/m     Constitutional: Well nourished. Alert and oriented, No acute distress. HEENT: [place] AT, moist mucus membranes. Trachea midline, no masses. Cardiovascular: No clubbing, cyanosis, or edema. Respiratory: Normal respiratory effort, no increased work of breathing. GI: Abdomen is soft, non tender, non distended, no abdominal masses. Liver and spleen  not palpable.  No hernias appreciated.  Stool sample for occult testing is not indicated.   GU: No CVA tenderness.  No bladder fullness or masses.  Patient with circumcised phallus.  Urethral meatus is patent.  No penile discharge. No penile lesions or rashes. Scrotum without lesions, cysts, rashes and/or edema.  Testicles are located scrotally bilaterally. No masses are appreciated in the testicles. Left and right epididymis are normal. Rectal: Patient with  normal sphincter tone. Anus and perineum without scarring or rashes. No rectal masses are appreciated. Prostate is approximately 45 grams,  no nodules are appreciated. Seminal vesicles are normal. Skin: No rashes, bruises or suspicious lesions. Lymph: No cervical or inguinal adenopathy. Neurologic: Grossly intact, no focal deficits, moving all 4 extremities. Psychiatric: Normal mood and affect.  Laboratory Data: Lab Results  Component Value Date   WBC 8.7 10/15/2015   HCT 43.6 10/15/2015   MCV 74 (L) 10/15/2015   PLT 272 10/15/2015    Lab Results  Component Value Date   CREATININE 1.3 10/18/2016    Lab Results  Component Value Date   PSA 3.2 12/12/2014  PSA's   4.9 ng/mL on 10/15/2015  4.5 ng/mL on 01/09/2016  4.2 ng/mL on 02/14/2016  4.3 ng/mL on 04/17/2016   Percentage of free PSA was 26.3 on 04/17/2016   Free PSA was 1.13 ng/mL on 04/17/2016  4.8 ng/mL on 12/26/2016    Lab Results  Component Value Date   HGBA1C 5.8 (H) 10/17/2016         Component Value Date/Time   CHOL 135 10/18/2016   CHOL 135 10/17/2016 0902   HDL 51 10/17/2016 0902   CHOLHDL 2.6 10/17/2016 0902    LDLCALC 74 10/17/2016 0902    Lab Results  Component Value Date   AST 23 10/17/2016   Lab Results  Component Value Date   ALT 24 10/17/2016    Assessment & Plan:    1. Elevated PSA  - PSA's have been ~ 4 for almost one year  - continue with biannual PSA's  2. BPH with LUTS  - IPSS score is 4/1, it is improved  - Continue conservative management, avoiding bladder irritants and timed voiding's  - RTC in 6 months for IPSS, PSA and exam   3. Erectile dysfunction  - SHIM score is 25, it is improved  - I explained to the patient that in order to achieve an erection it takes good functioning of the nervous system (parasympathetic, sympathetic, sensory and motor), good blood flow into the erectile tissue of the penis and a desire to have sex  - I explained that conditions like diabetes, hypertension, coronary artery disease, peripheral vascular disease, smoking, alcohol consumption, age, sleep apnea and BPH can diminish the ability to have an erection  - Continue healthy eating and exercise  - RTC in 6 months for repeat SHIM score and exam   Return in about 6 months (around 07/01/2017) for IPSS, SHIM, PSA and exam.  These notes generated with voice recognition software. I apologize for typographical errors.  Zara Council, [facility] [address] [place], [place] [address] [phone]

## 2016-12-30 ENCOUNTER — Ambulatory Visit (INDEPENDENT_AMBULATORY_CARE_PROVIDER_SITE_OTHER): Payer: 59 | Admitting: Urology

## 2016-12-30 ENCOUNTER — Encounter: Payer: Self-pay | Admitting: Urology

## 2016-12-30 VITALS — BP 138/84 | HR 62 | Ht 71.0 in | Wt 248.4 lb

## 2016-12-30 DIAGNOSIS — N529 Male erectile dysfunction, unspecified: Secondary | ICD-10-CM

## 2016-12-30 DIAGNOSIS — N401 Enlarged prostate with lower urinary tract symptoms: Secondary | ICD-10-CM

## 2016-12-30 DIAGNOSIS — N138 Other obstructive and reflux uropathy: Secondary | ICD-10-CM

## 2016-12-30 DIAGNOSIS — R972 Elevated prostate specific antigen [PSA]: Secondary | ICD-10-CM | POA: Diagnosis not present

## 2017-01-14 ENCOUNTER — Other Ambulatory Visit: Payer: Self-pay | Admitting: Family Medicine

## 2017-01-14 DIAGNOSIS — E785 Hyperlipidemia, unspecified: Secondary | ICD-10-CM

## 2017-01-14 DIAGNOSIS — I10 Essential (primary) hypertension: Secondary | ICD-10-CM

## 2017-01-14 NOTE — Telephone Encounter (Signed)
Patient requesting refill of Pravastatin and Quinapril-HCTZ to [facility].

## 2017-04-22 ENCOUNTER — Encounter: Payer: Self-pay | Admitting: Family Medicine

## 2017-04-22 ENCOUNTER — Ambulatory Visit (INDEPENDENT_AMBULATORY_CARE_PROVIDER_SITE_OTHER): Payer: 59 | Admitting: Family Medicine

## 2017-04-22 VITALS — BP 100/50 | HR 94 | Temp 98.4°F | Resp 16 | Ht 70.25 in | Wt 258.5 lb

## 2017-04-22 DIAGNOSIS — E669 Obesity, unspecified: Secondary | ICD-10-CM | POA: Diagnosis not present

## 2017-04-22 DIAGNOSIS — M79671 Pain in right foot: Secondary | ICD-10-CM

## 2017-04-22 DIAGNOSIS — I10 Essential (primary) hypertension: Secondary | ICD-10-CM | POA: Diagnosis not present

## 2017-04-22 DIAGNOSIS — E8881 Metabolic syndrome: Secondary | ICD-10-CM | POA: Diagnosis not present

## 2017-04-22 DIAGNOSIS — R972 Elevated prostate specific antigen [PSA]: Secondary | ICD-10-CM | POA: Diagnosis not present

## 2017-04-22 DIAGNOSIS — E785 Hyperlipidemia, unspecified: Secondary | ICD-10-CM | POA: Diagnosis not present

## 2017-04-22 DIAGNOSIS — Z23 Encounter for immunization: Secondary | ICD-10-CM | POA: Diagnosis not present

## 2017-04-22 DIAGNOSIS — Z0000 Encounter for general adult medical examination without abnormal findings: Secondary | ICD-10-CM | POA: Diagnosis not present

## 2017-04-22 DIAGNOSIS — Z6836 Body mass index (BMI) 36.0-36.9, adult: Secondary | ICD-10-CM

## 2017-04-22 MED ORDER — QUINAPRIL-HYDROCHLOROTHIAZIDE 20-25 MG PO TABS: 1.0000 | ORAL_TABLET | Freq: Every day | ORAL | 1 refills | Status: DC

## 2017-04-22 MED ORDER — PRAVASTATIN SODIUM 40 MG PO TABS: 40.0000 mg | ORAL_TABLET | Freq: Every day | ORAL | 1 refills | Status: DC

## 2017-04-22 NOTE — Progress Notes (Signed)
Name: Jeffrey Whitaker   MRN: [id number]    DOB: 03/29/1961   Date:04/22/2017       Progress Note  Subjective  Chief Complaint  Chief Complaint  Patient presents with  . Annual Exam  . Follow-up    HPI  Metabolic Syndrome: on diet and exercise, denies polyphagia, polyuria or polydipsia, last hgbA1C 5.8% - his weight oscillates but is stable  HTN: he is taking medication as prescribed, denies chest pain or palpitation. BP is low today, but states at home is 118/70-80's. No dizziness  Obesity: he gained weight since last visit, he is still very physically active, but has been eating dinner later at night. He was eating smaller meals and is now eating three meals daily again.  Elevated PSA: getting monitored by Urologist. He has BPH but not on medication. Watchful waiting  Right heel pain: started two weeks ago, described as soreness, worse when first gets up. He is avoiding being bear foot at home, icing it and taking Ibuprofen with some improvement of symptoms.   Patient presents for annual CPE and follow up  USPSTF grade A and B recommendations: discussed with patient He is having PSA followed by Urologist , on aspirin daily   Diet: tries to eat healthy, avoids eating out of the house, but will resume healthy snacks and smaller meals Current Exercise Habits: Structured exercise class, Type of exercise: Other - see comments (he has a Physiological scientist twice a week, but goes to gym three other times by himself), Time (Minutes): 60, Frequency (Times/Week): 5, Weekly Exercise (Minutes/Week): 300, Intensity: Moderate Exercise limited by: None identified Current Exercise Habits: Structured exercise class, Type of exercise: Other - see comments (he has a Physiological scientist twice a week, but goes to gym three other times by himself), Time (Minutes): 60, Frequency (Times/Week): 5, Weekly Exercise (Minutes/Week): 300, Intensity: Moderate Exercise limited by: None identified    IPSS  Questionnaire (AUA-7): Over the past month.   1)  How often have you had a sensation of not emptying your bladder completely after you finish urinating?  1 - Less than 1 time in 5  2)  How often have you had to urinate again less than two hours after you finished urinating? 1 - Less than 1 time in 5  3)  How often have you found you stopped and started again several times when you urinated?  1 - Less than 1 time in 5  4) How difficult have you found it to postpone urination?  0 - Not at all  5) How often have you had a weak urinary stream?  0 - Not at all  6) How often have you had to push or strain to begin urination?  0 - Not at all  7) How many times did you most typically get up to urinate from the time you went to bed until the time you got up in the morning?  1 - 1 time  Total score:  0-7 mildly symptomatic   8-19 moderately symptomatic   20-35 severely symptomatic   Depression:  Depression screen [facility] 2/9 04/22/2017 10/17/2016 04/17/2016 01/09/2016 10/15/2015  Decreased Interest 0 0 0 0 0  Down, Depressed, Hopeless 0 0 0 0 0  PHQ - 2 Score 0 0 0 0 0    Hypertension: BP Readings from Last 3 Encounters:  04/22/17 (!) 100/50  12/30/16 138/84  10/17/16 118/76    Obesity: Wt Readings from Last 3 Encounters:  04/22/17 258 lb 8  oz (117.3 kg)  12/30/16 248 lb 6.4 oz (112.7 kg)  10/17/16 248 lb 6.4 oz (112.7 kg)   BMI Readings from Last 3 Encounters:  04/22/17 36.83 kg/m  12/30/16 34.64 kg/m  10/17/16 34.64 kg/m     Lipids:  Lab Results  Component Value Date   CHOL 135 10/18/2016   CHOL 135 10/17/2016   CHOL 138 10/15/2015   Lab Results  Component Value Date   HDL 51 10/17/2016   HDL 48 10/15/2015   HDL 47 10/10/2014   Lab Results  Component Value Date   LDLCALC 74 10/17/2016   LDLCALC 75 10/15/2015   LDLCALC 76 10/10/2014   Lab Results  Component Value Date   TRIG 49 10/17/2016   TRIG 77 10/15/2015   TRIG 132 10/10/2014   Lab Results  Component Value Date    CHOLHDL 2.6 10/17/2016   CHOLHDL 2.9 10/15/2015   No results found for: LDLDIRECT Glucose:  Glucose  Date Value Ref Range Status  10/17/2016 93 65 - 99 mg/dL Final  10/15/2015 107 (H) 65 - 99 mg/dL Final    Alcohol: very seldom, a couple of drinks a month Tobacco use: smoked but quit over 15 years ago  Married STD testing and prevention (chl/gon/syphilis): N/A HIV, hep C: 09/2012 , but does not want HIV  Skin cancer: no changes Colorectal cancer: 08/2012 Prostate cancer: seeing Urologist Lab Results  Component Value Date   PSA 3.2 12/12/2014   Lung cancer:  Low Dose CT Chest recommended if Age 53-80 years, 30 pack-year currently smoking OR have quit w/in 15years. Patient does not qualify.    Aspirin: yes ECG:  Done 2017  Advanced Care Planning: A voluntary discussion about advance care planning including the explanation and discussion of advance directives.  Discussed health care proxy and Living will, and the patient was able to identify a health care proxy as wife  Patient does have a living will at present time.   Patient Active Problem List   Diagnosis Date Noted  . Allergic rhinitis, seasonal 04/15/2015  . Abnormal blood sugar 04/11/2015  . Benign hypertension 04/11/2015  . Dyslipidemia 04/11/2015  . Obesity (BMI 30.0-34.9) 04/11/2015  . Right medial knee pain 04/11/2015  . Metabolic syndrome 81/07/7508  . Left shoulder pain 04/11/2015  . Umbilical hernia 25/85/2778  . Microcytosis 04/11/2015    Past Surgical History:  Procedure Laterality Date  . KNEE SURGERY     at age 4     Family History  Problem Relation Age of Onset  . Hypertension Mother   . CAD Mother   . Benign prostatic hyperplasia Maternal Uncle   . Kidney disease Neg Hx   . Prostate cancer Neg Hx   . Bladder Cancer Neg Hx   . Kidney cancer Neg Hx     Social History   Social History  . Marital status: Married    Spouse name: N/A  . Number of children: N/A  . Years of education:  N/A   Occupational History  . Not on file.   Social History Main Topics  . Smoking status: Former Research scientist (life sciences)  . Smokeless tobacco: Former Systems developer    Quit date: 07/29/1987  . Alcohol use No  . Drug use: No  . Sexual activity: Yes    Partners: Female   Other Topics Concern  . Not on file   Social History Narrative  . No narrative on file     Current Outpatient Prescriptions:  .  aspirin 81 MG chewable  tablet, Chew 1 tablet by mouth daily., Disp: , Rfl:  .  Multiple Vitamin tablet, Take 1 tablet by mouth daily., Disp: , Rfl:  .  pravastatin (PRAVACHOL) 40 MG tablet, Take 1 tablet (40 mg total) by mouth daily., Disp: 90 tablet, Rfl: 1 .  quinapril-hydrochlorothiazide (ACCURETIC) 20-25 MG tablet, Take 1 tablet by mouth daily., Disp: 90 tablet, Rfl: 1  No Known Allergies   ROS   Constitutional: Negative for fever or weight change.  Respiratory: Negative for cough and shortness of breath.   Cardiovascular: Negative for chest pain or palpitations.  Gastrointestinal: Negative for abdominal pain, no bowel changes.  Musculoskeletal: Negative for gait problem or joint swelling.  Skin: Negative for rash.  Neurological: Negative for dizziness or headache.  No other specific complaints in a complete review of systems (except as listed in HPI above).   Objective  Vitals:   04/22/17 0810  BP: (!) 100/50  Pulse: 94  Resp: 16  Temp: 98.4 F (36.9 C)  TempSrc: Oral  SpO2: 97%  Weight: 258 lb 8 oz (117.3 kg)  Height: 5' 10.25" (1.784 m)    Body mass index is 36.83 kg/m.  Physical Exam  Constitutional: Patient appears well-developed and obese. No distress.  HENT: Head: Normocephalic and atraumatic. Ears: B TMs ok, no erythema or effusion; Nose: Nose normal. Mouth/Throat: Oropharynx is clear and moist. No oropharyngeal exudate.  Eyes: Conjunctivae and EOM are normal. Pupils are equal, round, and reactive to light. No scleral icterus.  Neck: Normal range of motion. Neck supple. No  JVD present. No thyromegaly present.  Cardiovascular: Normal rate, regular rhythm and normal heart sounds.  No murmur heard. No BLE edema. Pulmonary/Chest: Effort normal and breath sounds normal. No respiratory distress. Abdominal: Soft. Bowel sounds are normal, no distension. There is no tenderness. no masses MALE GENITALIA: not done RECTAL:not done Musculoskeletal: Normal range of motion, no joint effusions. No gross deformities Neurological: he is alert and oriented to person, place, and time. No cranial nerve deficit. Coordination, balance, strength, speech and gait are normal.  Skin: Skin is warm and dry. No rash noted. No erythema.  Psychiatric: Patient has a normal mood and affect. behavior is normal. Judgment and thought content normal.    PHQ2/9: Depression screen [facility] 2/9 04/22/2017 10/17/2016 04/17/2016 01/09/2016 10/15/2015  Decreased Interest 0 0 0 0 0  Down, Depressed, Hopeless 0 0 0 0 0  PHQ - 2 Score 0 0 0 0 0     Fall Risk: Fall Risk  04/22/2017 10/17/2016 04/17/2016 01/09/2016 10/15/2015  Falls in the past year? No No No No No     Functional Status Survey: Is the patient deaf or have difficulty hearing?: No Does the patient have difficulty seeing, even when wearing glasses/contacts?: No Does the patient have difficulty concentrating, remembering, or making decisions?: No Does the patient have difficulty walking or climbing stairs?: No Does the patient have difficulty dressing or bathing?: No Does the patient have difficulty doing errands alone such as visiting a doctor's office or shopping?: No   Assessment & Plan  1. Encounter for routine history and physical exam for male  Discussed importance of 150 minutes of physical activity weekly, eat two servings of fish weekly, eat one serving of tree nuts ( cashews, pistachios, pecans, almonds.Marland Kitchen) every other day, eat 6 servings of fruit/vegetables daily and drink plenty of water and avoid sweet beverages.   2.  Dyslipidemia  - pravastatin (PRAVACHOL) 40 MG tablet; Take 1 tablet (40 mg total) by  mouth daily.  Dispense: 90 tablet; Refill: 1  3. Benign hypertension  - quinapril-hydrochlorothiazide (ACCURETIC) 20-25 MG tablet; Take 1 tablet by mouth daily.  Dispense: 90 tablet; Refill: 1  4. Metabolic syndrome  Discussed life style modification   5. Elevated PSA  Seeing Urologist   6. Obesity (BMI 30.0-34.9)  Discussed with the patient the risk posed by an increased BMI. Discussed importance of portion control, calorie counting and at least 150 minutes of physical activity weekly. Avoid sweet beverages and drink more water. Eat at least 6 servings of fruit and vegetables daily   7. Needs flu shot  - Flu Vaccine QUAD 36+ mos IM   8. Pain of right heel  Advised k- laser with Dr. Freddi Che  -USPSTF grade A and B recommendations reviewed with patient; age-appropriate recommendations, preventive care, screening tests, etc discussed and encouraged; healthy living encouraged; see AVS for patient education given to patient

## 2017-04-22 NOTE — Patient Instructions (Signed)

## 2017-05-19 ENCOUNTER — Ambulatory Visit: Payer: 59

## 2017-05-19 VITALS — Wt 252.6 lb

## 2017-05-19 DIAGNOSIS — E669 Obesity, unspecified: Secondary | ICD-10-CM

## 2017-05-19 NOTE — Progress Notes (Signed)
Patient came in for a weight check today. His weight was 252.6#.

## 2017-06-29 ENCOUNTER — Other Ambulatory Visit: Payer: Self-pay

## 2017-06-29 DIAGNOSIS — R972 Elevated prostate specific antigen [PSA]: Secondary | ICD-10-CM

## 2017-06-30 ENCOUNTER — Other Ambulatory Visit: Payer: 59

## 2017-06-30 DIAGNOSIS — R972 Elevated prostate specific antigen [PSA]: Secondary | ICD-10-CM

## 2017-07-01 LAB — PSA: PROSTATE SPECIFIC AG, SERUM: 5.4 ng/mL — AB (ref 0.0–4.0)

## 2017-07-01 NOTE — Progress Notes (Deleted)
07/02/2017 7:42 AM   Jeffrey Whitaker Setting [birth year] [id number]  Referring provider: Steele Sizer, MD [address] [name], [place] [address]  No chief complaint on file.   HPI: Patient is a 56 year old African-American male with elevated PSA, BPH with LUTS and erectile dysfunction who presents today for a 6 months follow up.    Elevated PSA  Patient had an elevated PSA found by his primary care physician, Dr. Ancil Boozer, who is referred to [place] for further evaluation and management.  Patient's PSA has been found to be rising over the past year. One year ago the value was 3.2, 4 months ago it rose to 4.9 and 1 month ago decreased to 4.5.  He does not have a family history of prostate cancer.  His most current PSA was 5.4 in 06/30/2017.    BPH with LUTS His IPSS score today is ***, which is *** lower urinary tract symptomatology.  He is *** with his quality life due to his urinary symptoms.  His previous IPSS score was 4/0. His major complaint(s) today is/are .   He has had these symptoms for the last few years.  He denies any dysuria, hematuria or suprapubic pain.   He also denies any recent fevers, chills, nausea or vomiting.   Score:  1-7 Mild 8-19 Moderate 20-35 Severe  Erectile dysfunction His SHIM score is ***, which is *** ED.   His previous SHIM score was 25.  His major complaint is decrease and the firmness of erections.  His libido is preserved.   His risk factors for ED are age, HLD, HTN, obesity, BPH and age.  He denies any painful erections or curvatures with his erections.      Score: 1-7 Severe ED 8-11 Moderate ED 12-16 Mild-Moderate ED 17-21 Mild ED 22-25 No ED  PMH: Past Medical History:  Diagnosis Date  . Elevated PSA   . Hypertension     Surgical History: Past Surgical History:  Procedure Laterality Date  . KNEE SURGERY     at age 22     Home Medications:  Allergies as of 07/02/2017   No Known Allergies     Medication List        Accurate as of 07/01/17  7:42 AM. Always use your most recent med list.          aspirin 81 MG chewable tablet Chew 1 tablet by mouth daily.   Multiple Vitamin tablet Take 1 tablet by mouth daily.   pravastatin 40 MG tablet Commonly known as:  PRAVACHOL Take 1 tablet (40 mg total) by mouth daily.   quinapril-hydrochlorothiazide 20-25 MG tablet Commonly known as:  ACCURETIC Take 1 tablet by mouth daily.       Allergies: No Known Allergies  Family History: Family History  Problem Relation Age of Onset  . Hypertension Mother   . CAD Mother   . Benign prostatic hyperplasia Maternal Uncle   . Kidney disease Neg Hx   . Prostate cancer Neg Hx   . Bladder Cancer Neg Hx   . Kidney cancer Neg Hx     Social History:  reports that he has quit smoking. He quit smokeless tobacco use about 29 years ago. He reports that he does not drink alcohol or use drugs.  ROS:  Physical Exam: There were no vitals taken for this visit.  Constitutional: Well nourished. Alert and oriented, No acute distress. HEENT: [place] AT, moist mucus membranes. Trachea midline, no masses. Cardiovascular: No clubbing, cyanosis, or edema. Respiratory: Normal respiratory effort, no increased work of breathing. GI: Abdomen is soft, non tender, non distended, no abdominal masses. Liver and spleen not palpable.  No hernias appreciated.  Stool sample for occult testing is not indicated.   GU: No CVA tenderness.  No bladder fullness or masses.  Patient with circumcised phallus.  Urethral meatus is patent.  No penile discharge. No penile lesions or rashes. Scrotum without lesions, cysts, rashes and/or edema.  Testicles are located scrotally bilaterally. No masses are appreciated in the testicles. Left and right epididymis are normal. Rectal: Patient with  normal sphincter tone. Anus and perineum without scarring or rashes. No rectal masses are appreciated.  Prostate is approximately 45 grams,  no nodules are appreciated. Seminal vesicles are normal. Skin: No rashes, bruises or suspicious lesions. Lymph: No cervical or inguinal adenopathy. Neurologic: Grossly intact, no focal deficits, moving all 4 extremities. Psychiatric: Normal mood and affect.  Laboratory Data:  Lab Results  Component Value Date   PSA 3.2 12/12/2014  PSA's   4.9 ng/mL on 10/15/2015  4.5 ng/mL on 01/09/2016  4.2 ng/mL on 02/14/2016  4.3 ng/mL on 04/17/2016   Percentage of free PSA was 26.3 on 04/17/2016   Free PSA was 1.13 ng/mL on 04/17/2016  4.8 ng/mL on 12/26/2016  5.4 ng/mL on 06/30/2017   Lab Results  Component Value Date   HGBA1C 5.8 (H) 10/17/2016       Component Value Date/Time   CHOL 135 10/18/2016   CHOL 135 10/17/2016 0902   HDL 51 10/17/2016 0902   CHOLHDL 2.6 10/17/2016 0902   LDLCALC 74 10/17/2016 0902    Lab Results  Component Value Date   AST 23 10/17/2016   Lab Results  Component Value Date   ALT 24 10/17/2016   I have reviewed the labs.  Assessment & Plan:    1. Elevated PSA  - PSA's has increased to 5.4 which is a sharp increase in the last 6 months  - I discussed with the patient that PSA is an acronym for  prostate specific antigen,  which is a protein made by the prostate gland and can be detected in the blood stream. I explained to the patient situations that would increase the PSA, such as: a man's age,  BPH, infection, recent intercourse/ejaculation, prostate infarction, recent urethroscopic manipulation (Foley placement/cystoscopy) and prostate cancer.          - At this time, I have offered to repeat the PSA to rule out lab error, shorten monitoring intervals to q 3 months, obtaining a 4K score (- offered the 4K score test to the patient - explained to the patient that the 4K score test result is the individual patient's risk for aggressive prostate cancer of Gleason's score 7 and higher if a prostate biopsy were to be  performed. The 4K score is calculated from blood test results of four kallikrein proteins: Total PSA, Free PSA, Intact PSA and human kallikrein-related peptidase 2 (hK2), combined with patient age, DRE result (if reported), and history of no prostate biopsy or prior negative prostate biopsy.   If that should return elevated, we could continue observation or pursue a prostate biopsy.) or undergoing a biopsy  - explained to him that the biopsy would be the only way to get a pathological  diagnosis - the other tests only give probability of having prostate cancer  - he would like to ***  2. BPH with LUTS  - IPSS score is 4/1, it is improved  - Continue conservative management, avoiding bladder irritants and timed voiding's  - RTC in 6 months for IPSS, PSA and exam   3. Erectile dysfunction  - SHIM score is 25, it is improved  - I explained to the patient that in order to achieve an erection it takes good functioning of the nervous system (parasympathetic, sympathetic, sensory and motor), good blood flow into the erectile tissue of the penis and a desire to have sex  - I explained that conditions like diabetes, hypertension, coronary artery disease, peripheral vascular disease, smoking, alcohol consumption, age, sleep apnea and BPH can diminish the ability to have an erection  - Continue healthy eating and exercise  - RTC in 6 months for repeat SHIM score and exam   No Follow-up on file.  These notes generated with voice recognition software. I apologize for typographical errors.  Zara Council, [facility] [address] [place], [place] [address] [phone]

## 2017-07-02 ENCOUNTER — Ambulatory Visit: Payer: 59 | Admitting: Urology

## 2017-07-02 ENCOUNTER — Ambulatory Visit (INDEPENDENT_AMBULATORY_CARE_PROVIDER_SITE_OTHER): Payer: 59 | Admitting: Urology

## 2017-07-02 ENCOUNTER — Encounter: Payer: Self-pay | Admitting: Urology

## 2017-07-02 VITALS — BP 131/81 | HR 65 | Ht 71.0 in | Wt 255.2 lb

## 2017-07-02 DIAGNOSIS — N401 Enlarged prostate with lower urinary tract symptoms: Secondary | ICD-10-CM

## 2017-07-02 DIAGNOSIS — N529 Male erectile dysfunction, unspecified: Secondary | ICD-10-CM

## 2017-07-02 DIAGNOSIS — N138 Other obstructive and reflux uropathy: Secondary | ICD-10-CM | POA: Diagnosis not present

## 2017-07-02 DIAGNOSIS — R972 Elevated prostate specific antigen [PSA]: Secondary | ICD-10-CM

## 2017-07-02 NOTE — Progress Notes (Signed)
07/02/2017 3:55 PM   Jeffrey Whitaker November 05, 1960 [id number]  Referring provider: Steele Sizer, MD [address] [place], [place] [address]  Chief Complaint  Patient presents with  . Elevated PSA    HPI: Patient is a 56 year old African-American male with elevated PSA, BPH with LUTS and erectile dysfunction who presents today for a 6 months follow up.    Elevated PSA  Patient had an elevated PSA found by his primary care physician, Dr. Ancil Boozer, who is referred to [place] for further evaluation and management.  Patient's PSA has been found to be rising over the past year. One year ago the value was 3.2, 4 months ago it rose to 4.9 and 1 month ago decreased to 4.5.  He does not have a family history of prostate cancer.  His most current PSA was 5.4 in 06/30/2017.    BPH with LUTS His IPSS score today is 3, which is mild lower urinary tract symptomatology.  He is delighted with his quality life due to his urinary symptoms.  His previous IPSS score was 4/0. His major complaint(s) today is/are .   He has had these symptoms for the last few years.  He denies any dysuria, hematuria or suprapubic pain.   He also denies any recent fevers, chills, nausea or vomiting. IPSS    Row Name 07/02/17 1500         International Prostate Symptom Score   How often have you had the sensation of not emptying your bladder?  Not at All     How often have you had to urinate less than every two hours?  Less than 1 in 5 times     How often have you found you stopped and started again several times when you urinated?  Less than 1 in 5 times     How often have you found it difficult to postpone urination?  Not at All     How often have you had a weak urinary stream?  Not at All     How often have you had to strain to start urination?  Not at All     How many times did you typically get up at night to urinate?  1 Time     Total IPSS Score  3       Quality of Life due to urinary symptoms   If you  were to spend the rest of your life with your urinary condition just the way it is now how would you feel about that?  Delighted        Score:  1-7 Mild 8-19 Moderate 20-35 Severe  Erectile dysfunction His SHIM score is 25, which is no ED.   His previous SHIM score was 25.  His major complaint is decrease and the firmness of erections.  His libido is preserved.   His risk factors for ED are age, HLD, HTN, obesity, BPH and age.  He denies any painful erections or curvatures with his erections.    SHIM    Row Name 07/02/17 1534         SHIM: Over the last 6 months:   How do you rate your confidence that you could get and keep an erection?  Very High     When you had erections with sexual stimulation, how often were your erections hard enough for penetration (entering your partner)?  Almost Always or Always     During sexual intercourse, how often were you able to  maintain your erection after you had penetrated (entered) your partner?  Almost Always or Always     During sexual intercourse, how difficult was it to maintain your erection to completion of intercourse?  Not Difficult     When you attempted sexual intercourse, how often was it satisfactory for you?  Almost Always or Always       SHIM Total Score   SHIM  25        Score: 1-7 Severe ED 8-11 Moderate ED 12-16 Mild-Moderate ED 17-21 Mild ED 22-25 No ED  PMH: Past Medical History:  Diagnosis Date  . Elevated PSA   . Hypertension     Surgical History: Past Surgical History:  Procedure Laterality Date  . KNEE SURGERY     at age 71     Home Medications:  Allergies as of 07/02/2017   No Known Allergies     Medication List        Accurate as of 07/02/17  3:55 PM. Always use your most recent med list.          aspirin 81 MG chewable tablet Chew 1 tablet by mouth daily.   Multiple Vitamin tablet Take 1 tablet by mouth daily.   pravastatin 40 MG tablet Commonly known as:  PRAVACHOL Take 1 tablet (40 mg  total) by mouth daily.   quinapril-hydrochlorothiazide 20-25 MG tablet Commonly known as:  ACCURETIC Take 1 tablet by mouth daily.       Allergies: No Known Allergies  Family History: Family History  Problem Relation Age of Onset  . Hypertension Mother   . CAD Mother   . Benign prostatic hyperplasia Maternal Uncle   . Kidney disease Neg Hx   . Prostate cancer Neg Hx   . Bladder Cancer Neg Hx   . Kidney cancer Neg Hx     Social History:  reports that he has quit smoking. He quit smokeless tobacco use about 29 years ago. He reports that he does not drink alcohol or use drugs.  ROS: UROLOGY Frequent Urination?: No Hard to postpone urination?: No Burning/pain with urination?: No Get up at night to urinate?: No Leakage of urine?: No Urine stream starts and stops?: No Trouble starting stream?: No Do you have to strain to urinate?: No Blood in urine?: No Urinary tract infection?: No Sexually transmitted disease?: No Injury to kidneys or bladder?: No Painful intercourse?: No Weak stream?: No Erection problems?: No Penile pain?: No  Gastrointestinal Nausea?: No Vomiting?: No Indigestion/heartburn?: No Diarrhea?: No Constipation?: No  Constitutional Fever: No Night sweats?: No Weight loss?: No Fatigue?: No  Skin Skin rash/lesions?: No Itching?: No  Eyes Blurred vision?: No Double vision?: No  Ears/Nose/Throat Sore throat?: No Sinus problems?: No  Hematologic/Lymphatic Swollen glands?: No Easy bruising?: No  Cardiovascular Leg swelling?: No Chest pain?: No  Respiratory Cough?: No Shortness of breath?: No  Endocrine Excessive thirst?: No  Musculoskeletal Back pain?: No Joint pain?: No  Neurological Headaches?: No Dizziness?: No  Psychologic Depression?: No Anxiety?: No  Physical Exam: BP 131/81   Pulse 65   Ht 5\' 11"  (1.803 m)   Wt 255 lb 3.2 oz (115.8 kg)   BMI 35.59 kg/m   Constitutional: Well nourished. Alert and oriented,  No acute distress. HEENT: [place] AT, moist mucus membranes. Trachea midline, no masses. Cardiovascular: No clubbing, cyanosis, or edema. Respiratory: Normal respiratory effort, no increased work of breathing. GI: Abdomen is soft, non tender, non distended, no abdominal masses. Liver and spleen not palpable.  No hernias appreciated.  Stool sample for occult testing is not indicated.   GU: No CVA tenderness.  No bladder fullness or masses.  Patient with circumcised phallus.  Urethral meatus is patent.  No penile discharge. No penile lesions or rashes. Scrotum without lesions, cysts, rashes and/or edema.  Testicles are located scrotally bilaterally. No masses are appreciated in the testicles. Left and right epididymis are normal. Rectal: Patient with  normal sphincter tone. Anus and perineum without scarring or rashes. No rectal masses are appreciated. Prostate is approximately 45 grams, left lobe may be slightly firmer than the right, no nodules are appreciated. Seminal vesicles are normal. Skin: No rashes, bruises or suspicious lesions. Lymph: No cervical or inguinal adenopathy. Neurologic: Grossly intact, no focal deficits, moving all 4 extremities. Psychiatric: Normal mood and affect.  Laboratory Data:  Lab Results  Component Value Date   PSA 3.2 12/12/2014  PSA's   4.9 ng/mL on 10/15/2015  4.5 ng/mL on 01/09/2016  4.2 ng/mL on 02/14/2016  4.3 ng/mL on 04/17/2016   Percentage of free PSA was 26.3 on 04/17/2016   Free PSA was 1.13 ng/mL on 04/17/2016  4.8 ng/mL on 12/26/2016  5.4 ng/mL on 06/30/2017   Lab Results  Component Value Date   HGBA1C 5.8 (H) 10/17/2016       Component Value Date/Time   CHOL 135 10/18/2016   CHOL 135 10/17/2016 0902   HDL 51 10/17/2016 0902   CHOLHDL 2.6 10/17/2016 0902   LDLCALC 74 10/17/2016 0902    Lab Results  Component Value Date   AST 23 10/17/2016   Lab Results  Component Value Date   ALT 24 10/17/2016   I have reviewed the  labs.  Assessment & Plan:    1. Elevated PSA  - PSA's has increased to 5.4 which is a sharp increase in the last 6 months  - I discussed with the patient that PSA is an acronym for  prostate specific antigen,  which is a protein made by the prostate gland and can be detected in the blood stream. I explained to the patient situations that would increase the PSA, such as: a man's age,  BPH, infection, recent intercourse/ejaculation, prostate infarction, recent urethroscopic manipulation (Foley placement/cystoscopy) and prostate cancer.         - At this time, I have offered to repeat the PSA to rule out lab error, shorten monitoring intervals to q 3 months, obtaining a 4K score (- offered the 4K score test to the patient - explained to the patient that the 4K score test result is the individual patient's risk for aggressive prostate cancer of Gleason's score 7 and higher if a prostate biopsy were to be performed. The 4K score is calculated from blood test results of four kallikrein proteins: Total PSA, Free PSA, Intact PSA and human kallikrein-related peptidase 2 (hK2), combined with patient age, DRE result (if reported), and history of no prostate biopsy or prior negative prostate biopsy.   If that should return elevated, we could continue observation or pursue a prostate biopsy.) or undergoing a biopsy  - explained to him that the biopsy would be the only way to get a pathological diagnosis - the other tests only give probability of having prostate cancer  - he would like to repeat the PSA in 3 months as he admits to having sexual relations and hard BM's prior to his blood work  2. BPH with LUTS  - IPSS score is 3/0, it is improved  - Continue  conservative management, avoiding bladder irritants and timed voiding's  - RTC in 6 months for IPSS, PSA and exam   3. Erectile dysfunction  - SHIM score is 25, it is stable  - I explained to the patient that in order to achieve an erection it takes good  functioning of the nervous system (parasympathetic, sympathetic, sensory and motor), good blood flow into the erectile tissue of the penis and a desire to have sex  - I explained that conditions like diabetes, hypertension, coronary artery disease, peripheral vascular disease, smoking, alcohol consumption, age, sleep apnea and BPH can diminish the ability to have an erection  - Continue healthy eating and exercise  - RTC in 6 months for repeat SHIM score and exam   Return in about 3 months (around 09/30/2017) for PSA only.  These notes generated with voice recognition software. I apologize for typographical errors.  Zara Council, [facility] [address], [place], [place] [address] [phone]

## 2017-07-28 DIAGNOSIS — K429 Umbilical hernia without obstruction or gangrene: Secondary | ICD-10-CM

## 2017-07-28 DIAGNOSIS — C61 Malignant neoplasm of prostate: Secondary | ICD-10-CM

## 2017-07-28 HISTORY — DX: Malignant neoplasm of prostate: C61

## 2017-07-28 HISTORY — DX: Umbilical hernia without obstruction or gangrene: K42.9

## 2017-09-30 ENCOUNTER — Other Ambulatory Visit: Payer: 59

## 2017-09-30 DIAGNOSIS — R972 Elevated prostate specific antigen [PSA]: Secondary | ICD-10-CM | POA: Diagnosis not present

## 2017-10-01 LAB — PSA: PROSTATE SPECIFIC AG, SERUM: 4.8 ng/mL — AB (ref 0.0–4.0)

## 2017-10-02 ENCOUNTER — Telehealth: Payer: Self-pay

## 2017-10-02 DIAGNOSIS — R972 Elevated prostate specific antigen [PSA]: Secondary | ICD-10-CM

## 2017-10-02 NOTE — Telephone Encounter (Signed)
Letter sent and appts made.

## 2017-10-02 NOTE — Telephone Encounter (Signed)
-----   Message from Nori Riis, PA-C sent at 10/01/2017  7:38 AM EST ----- Please let Jeffrey Whitaker know that his PSA has reduced to 4.8.  I would like to have in repeated again in 3 months and an office visit with me.

## 2017-12-06 ENCOUNTER — Other Ambulatory Visit: Payer: Self-pay | Admitting: Family Medicine

## 2017-12-06 DIAGNOSIS — E785 Hyperlipidemia, unspecified: Secondary | ICD-10-CM

## 2017-12-06 DIAGNOSIS — I10 Essential (primary) hypertension: Secondary | ICD-10-CM

## 2017-12-06 NOTE — Telephone Encounter (Signed)
Sent refill, but he is due for a follow up, please ask him to be seen in the next couple of months

## 2017-12-07 NOTE — Telephone Encounter (Signed)
Called pt to schedule an appt. Pt states he is out of town and would like to call back to do so when he is back in town

## 2017-12-31 ENCOUNTER — Other Ambulatory Visit: Payer: 59

## 2017-12-31 DIAGNOSIS — R972 Elevated prostate specific antigen [PSA]: Secondary | ICD-10-CM

## 2018-01-01 LAB — PSA: PROSTATE SPECIFIC AG, SERUM: 4.5 ng/mL — AB (ref 0.0–4.0)

## 2018-01-01 NOTE — Progress Notes (Addendum)
01/04/2018 8:54 AM   [name] 1960/10/19 [id number]  Referring provider: Steele Sizer, MD [address] [place], [place] [address]  Chief Complaint  Patient presents with  . Elevated PSA    HPI: Patient is a 57 year old African-American male with elevated PSA, BPH with LUTS and erectile dysfunction who presents today for a 6 months follow up.    Elevated PSA  PSA trend  3.2 in Dec 12, 2014  4.9 in October 15, 2015  4.5 in January 09, 2016  4.3 in July 10 20th 2017 with 10% probability of prostate cancer  4.5 in April 17, 2016 with 5% probability of prostate cancer  4.5 in July 01, 2016  4.8 in December 26, 2016  5.4 in July 02, 2017   4.8 in September 30, 2017  4.5 in December 31, 2017  BPH with LUTS His IPSS score today is 1, which is mild lower urinary tract symptomatology.  He is delighted with his quality life due to his urinary symptoms.  His previous IPSS score was 3/0.  His has no urinary complaints at this time.  He denies any dysuria, hematuria or suprapubic pain.   He also denies any recent fevers, chills, nausea or vomiting. IPSS    Row Name 01/04/18 0800         International Prostate Symptom Score   How often have you had the sensation of not emptying your bladder?  Not at All     How often have you had to urinate less than every two hours?  Not at All     How often have you found you stopped and started again several times when you urinated?  Not at All     How often have you found it difficult to postpone urination?  Not at All     How often have you had a weak urinary stream?  Not at All     How often have you had to strain to start urination?  Not at All     How many times did you typically get up at night to urinate?  1 Time     Total IPSS Score  1       Quality of Life due to urinary symptoms   If you were to spend the rest of your life with your urinary condition just the way it is now how would you feel about that?  Delighted          Score:  1-7 Mild 8-19 Moderate 20-35 Severe  Erectile dysfunction His SHIM score is 25, which is no ED.   His previous SHIM score was 25.  His major complaint is decrease and the firmness of erections.  His libido is preserved.   His risk factors for ED are age, HLD, HTN, obesity, BPH and age.  He denies any painful erections or curvatures with his erections.    [place] Name 01/04/18 0830         SHIM: Over the last 6 months:   How do you rate your confidence that you could get and keep an erection?  Very High     When you had erections with sexual stimulation, how often were your erections hard enough for penetration (entering your partner)?  Almost Always or Always     During sexual intercourse, how often were you able to maintain your erection after you had penetrated (entered) your partner?  Almost Always or Always  During sexual intercourse, how difficult was it to maintain your erection to completion of intercourse?  Not Difficult     When you attempted sexual intercourse, how often was it satisfactory for you?  Almost Always or Always       SHIM Total Score   SHIM  25        Score: 1-7 Severe ED 8-11 Moderate ED 12-16 Mild-Moderate ED 17-21 Mild ED 22-25 No ED  PMH: Past Medical History:  Diagnosis Date  . Elevated PSA   . Hypertension     Surgical History: Past Surgical History:  Procedure Laterality Date  . KNEE SURGERY     at age 54     Home Medications:  Allergies as of 01/04/2018   No Known Allergies     Medication List        Accurate as of 01/04/18  8:54 AM. Always use your most recent med list.          aspirin 81 MG chewable tablet Chew 1 tablet by mouth daily.   Multiple Vitamin tablet Take 1 tablet by mouth daily.   pravastatin 40 MG tablet Commonly known as:  PRAVACHOL TAKE 1 TABLET BY MOUTH  DAILY   quinapril-hydrochlorothiazide 20-25 MG tablet Commonly known as:  ACCURETIC TAKE 1 TABLET BY MOUTH  DAILY        Allergies: No Known Allergies  Family History: Family History  Problem Relation Age of Onset  . Hypertension Mother   . CAD Mother   . Benign prostatic hyperplasia Maternal Uncle   . Kidney disease Neg Hx   . Prostate cancer Neg Hx   . Bladder Cancer Neg Hx   . Kidney cancer Neg Hx     Social History:  reports that he has quit smoking. He quit smokeless tobacco use about 30 years ago. He reports that he does not drink alcohol or use drugs.  ROS: UROLOGY Frequent Urination?: No Hard to postpone urination?: No Burning/pain with urination?: No Get up at night to urinate?: No Leakage of urine?: No Urine stream starts and stops?: No Trouble starting stream?: No Do you have to strain to urinate?: No Blood in urine?: No Urinary tract infection?: No Sexually transmitted disease?: No Injury to kidneys or bladder?: No Painful intercourse?: No Weak stream?: No Erection problems?: No Penile pain?: No  Gastrointestinal Nausea?: No Vomiting?: No Indigestion/heartburn?: No Diarrhea?: No Constipation?: No  Constitutional Fever: No Night sweats?: No Weight loss?: No Fatigue?: No  Skin Skin rash/lesions?: No Itching?: No  Eyes Blurred vision?: No Double vision?: No  Ears/Nose/Throat Sore throat?: No Sinus problems?: No  Hematologic/Lymphatic Swollen glands?: No Easy bruising?: No  Cardiovascular Leg swelling?: No Chest pain?: No  Respiratory Cough?: No Shortness of breath?: No  Endocrine Excessive thirst?: No  Musculoskeletal Back pain?: No Joint pain?: No  Neurological Headaches?: No Dizziness?: No  Psychologic Depression?: No Anxiety?: No  Physical Exam: BP 122/75   Pulse 62   Resp 16   Ht 5\' 11"  (1.803 m)   Wt 241 lb (109.3 kg)   SpO2 96%   BMI 33.61 kg/m   Constitutional: Well nourished. Alert and oriented, No acute distress. HEENT: [place] AT, moist mucus membranes. Trachea midline, no masses. Cardiovascular: No clubbing,  cyanosis, or edema. Respiratory: Normal respiratory effort, no increased work of breathing. GI: Abdomen is soft, non tender, non distended, no abdominal masses. Liver and spleen not palpable.  No hernias appreciated.  Stool sample for occult testing is not indicated.  GU: No CVA tenderness.  No bladder fullness or masses.  Patient with circumcised phallus.  Urethral meatus is patent.  No penile discharge. No penile lesions or rashes. Scrotum without lesions, cysts, rashes and/or edema.  Testicles are located scrotally bilaterally. No masses are appreciated in the testicles. Left and right epididymis are normal. Rectal: Patient with  normal sphincter tone. Anus and perineum without scarring or rashes. No rectal masses are appreciated. Prostate is approximately 45 grams,  small right and left nodules are appreciated. Seminal vesicles are normal. Skin: No rashes, bruises or suspicious lesions. Lymph: No cervical or inguinal adenopathy. Neurologic: Grossly intact, no focal deficits, moving all 4 extremities. Psychiatric: Normal mood and affect.   Laboratory Data:  PSA trend above  Lab Results  Component Value Date   HGBA1C 5.8 (H) 10/17/2016       Component Value Date/Time   CHOL 135 10/18/2016   CHOL 135 10/17/2016 0902   HDL 51 10/17/2016 0902   CHOLHDL 2.6 10/17/2016 0902   LDLCALC 74 10/17/2016 0902    Lab Results  Component Value Date   AST 23 10/17/2016   Lab Results  Component Value Date   ALT 24 10/17/2016   I have reviewed the labs.  Assessment & Plan:    1. Elevated PSA PSA's have seem to have stabilized over the last several months Discussed with the patient that his PSA levels are above the age-specific PSAs for his age group and even though it seems to have stabilized over the last several months there is a possibility that he may have a prostate cancer Discussed his options of continuing to follow the PSA every 3 months, starting the finasteride to see how the  PSA responds, obtaining an MRI of the prostate for further evaluation of possible high-grade cancer or undergoing a biopsy at this time He would like to pursue a biopsy at this time Patient will be schedule for a TRUSPBx of prostate.  The procedure is explained and the risks involved, such as blood in urine, blood in stool, blood in semen, infection, urinary retention, and on rare occasions sepsis and death.  Patient understands the risks as explained to him and he wishes to proceed.  Patient is on ASA and is advised to discontinue the medication ten days prior to the biopsy.  2. Prostate nodule See exam  3. BPH with LUTS  - IPSS score is 1/0, it is improving   - Continue conservative management, avoiding bladder irritants and timed voiding's  - RTC for TRUSPBx of prostate  4. Erectile dysfunction SHIM score is 25, it is stable    Return for TRUSPBx of prostate, patient on ASA .  These notes generated with voice recognition software. I apologize for typographical errors.  Zara Council, PA-C  [facility] [address] [place], [place] [address] [phone]

## 2018-01-04 ENCOUNTER — Telehealth: Payer: Self-pay | Admitting: Urology

## 2018-01-04 ENCOUNTER — Encounter: Payer: Self-pay | Admitting: Urology

## 2018-01-04 ENCOUNTER — Telehealth: Payer: Self-pay | Admitting: Family Medicine

## 2018-01-04 ENCOUNTER — Ambulatory Visit (INDEPENDENT_AMBULATORY_CARE_PROVIDER_SITE_OTHER): Payer: 59 | Admitting: Urology

## 2018-01-04 VITALS — BP 122/75 | HR 62 | Resp 16 | Ht 71.0 in | Wt 241.0 lb

## 2018-01-04 DIAGNOSIS — R972 Elevated prostate specific antigen [PSA]: Secondary | ICD-10-CM | POA: Diagnosis not present

## 2018-01-04 DIAGNOSIS — N138 Other obstructive and reflux uropathy: Secondary | ICD-10-CM

## 2018-01-04 DIAGNOSIS — N402 Nodular prostate without lower urinary tract symptoms: Secondary | ICD-10-CM

## 2018-01-04 DIAGNOSIS — N401 Enlarged prostate with lower urinary tract symptoms: Secondary | ICD-10-CM

## 2018-01-04 DIAGNOSIS — N529 Male erectile dysfunction, unspecified: Secondary | ICD-10-CM | POA: Diagnosis not present

## 2018-01-04 NOTE — Telephone Encounter (Signed)
Patient returned call and was advised that clearance is not needed from Dr. Ancil Boozer to stop his aspirin.  He expressed understanding and will follow instructions.

## 2018-01-04 NOTE — Telephone Encounter (Unsigned)
Copied from [place] [id number]. Topic: Quick Communication - See Telephone Encounter >> Jan 04, 2018  1:38 PM Neva Seat wrote: Pt is needing to make an appt w/ Dr. Ancil Boozer - pt not sure if he needs a physical or an appt.  Pt was told thru MyChart he is needing an appt. CMA - Please call pt back to advise and make appt.

## 2018-01-04 NOTE — Telephone Encounter (Signed)
No clearance needed, pt was not put on Aspirin by his doctor, it was just a preventative voluntary measure taken for pt health. Per Larene Beach, he does not need clearance to stop prior to Biopsy. I attempted to reach pt, lmom for him to call our office to discuss instructions.

## 2018-01-04 NOTE — Telephone Encounter (Signed)
Patient is scheduled for a prostate biopsy on 02/08/18.  He is currently taking an 81mg  aspirin prescribed by Dr Ancil Boozer.  We will need to obtain clearance for him to stop taking his aspirin.

## 2018-01-21 ENCOUNTER — Telehealth: Payer: Self-pay | Admitting: Urology

## 2018-01-21 NOTE — Telephone Encounter (Signed)
Error

## 2018-01-25 ENCOUNTER — Telehealth: Payer: Self-pay | Admitting: Urology

## 2018-01-25 NOTE — Telephone Encounter (Signed)
Staff and I have attempted to move up his biopsy appointment.  I have expressed my concern about the delaying of a cancer diagnosis, but due to his ASA intake, his work schedule and his wife's work schedule we were not able to change his appointment.

## 2018-02-04 ENCOUNTER — Other Ambulatory Visit: Payer: 59 | Admitting: Urology

## 2018-02-08 ENCOUNTER — Encounter: Payer: Self-pay | Admitting: Urology

## 2018-02-08 ENCOUNTER — Ambulatory Visit (INDEPENDENT_AMBULATORY_CARE_PROVIDER_SITE_OTHER): Payer: 59 | Admitting: Urology

## 2018-02-08 ENCOUNTER — Other Ambulatory Visit: Payer: Self-pay | Admitting: Urology

## 2018-02-08 VITALS — BP 113/73 | HR 60 | Resp 16 | Ht 71.0 in | Wt 234.4 lb

## 2018-02-08 DIAGNOSIS — R972 Elevated prostate specific antigen [PSA]: Secondary | ICD-10-CM | POA: Diagnosis not present

## 2018-02-08 MED ORDER — GENTAMICIN SULFATE 40 MG/ML IJ SOLN
80.0000 mg | Freq: Once | INTRAMUSCULAR | Status: AC
  Administered 2018-02-08: 80 mg via INTRAMUSCULAR

## 2018-02-08 MED ORDER — LEVOFLOXACIN 500 MG PO TABS
500.0000 mg | ORAL_TABLET | Freq: Once | ORAL | Status: AC
  Administered 2018-02-08: 500 mg via ORAL

## 2018-02-08 NOTE — Progress Notes (Signed)
Prostate Biopsy Procedure   Informed consent was obtained after discussing risks/benefits of the procedure.  A time out was performed to ensure correct patient identity.  Pre-Procedure: - Last PSA Level: 4.30 December 2017 - Mild firmness left prostate  - Gentamicin given prophylactically - Levaquin 500 mg administered PO -Transrectal Ultrasound performed revealing a 55 gm prostate -No significant hypoechoic or median lobe noted  Procedure: - Prostate block performed using 10 cc 1% lidocaine and biopsies taken from sextant areas, a total of 12 under ultrasound guidance.  Post-Procedure: - Patient tolerated the procedure well - He was counseled to seek immediate medical attention if experiences any severe pain, significant bleeding, or fevers - Return in one week to discuss biopsy results

## 2018-02-10 ENCOUNTER — Ambulatory Visit (INDEPENDENT_AMBULATORY_CARE_PROVIDER_SITE_OTHER): Payer: 59 | Admitting: Family Medicine

## 2018-02-10 ENCOUNTER — Encounter: Payer: Self-pay | Admitting: Family Medicine

## 2018-02-10 VITALS — BP 118/72 | HR 90 | Temp 98.4°F | Resp 16 | Ht 71.0 in | Wt 234.7 lb

## 2018-02-10 DIAGNOSIS — E785 Hyperlipidemia, unspecified: Secondary | ICD-10-CM | POA: Diagnosis not present

## 2018-02-10 DIAGNOSIS — Z23 Encounter for immunization: Secondary | ICD-10-CM | POA: Diagnosis not present

## 2018-02-10 DIAGNOSIS — I10 Essential (primary) hypertension: Secondary | ICD-10-CM

## 2018-02-10 DIAGNOSIS — E8881 Metabolic syndrome: Secondary | ICD-10-CM

## 2018-02-10 DIAGNOSIS — E669 Obesity, unspecified: Secondary | ICD-10-CM

## 2018-02-10 MED ORDER — PRAVASTATIN SODIUM 40 MG PO TABS: 40.0000 mg | ORAL_TABLET | Freq: Every day | ORAL | 1 refills | Status: DC

## 2018-02-10 MED ORDER — QUINAPRIL-HYDROCHLOROTHIAZIDE 20-12.5 MG PO TABS: 1.0000 | ORAL_TABLET | Freq: Every day | ORAL | 0 refills | Status: DC

## 2018-02-10 NOTE — Progress Notes (Signed)
Name: Jeffrey Whitaker   MRN: [id number]    DOB: 04-14-1961   Date:02/10/2018       Progress Note  Subjective  Chief Complaint  Chief Complaint  Patient presents with  . Medication Refill    90 day supply of pravastatin & Quinapril-HCTZ  . Immunizations    Tdap    HPI   Metabolic Syndrome: on diet and exercise, denies polyphagia, polyuria or polydipsia, last hgbA1C 5.8% - his weight is down from last visit, he is going on a 70 mile hike in September and has been very active  HTN: he is taking medication as prescribed, denies chest pain or palpitation. BP is at goal, he denies side effects of medication.   Obesity: he has lost 14 lbs weight since last visit, he has been very physically active and eating healthy.   Elevated PSA: he had a biopsy this week. No complications  Patient Active Problem List   Diagnosis Date Noted  . Allergic rhinitis, seasonal 04/15/2015  . Abnormal blood sugar 04/11/2015  . Benign hypertension 04/11/2015  . Dyslipidemia 04/11/2015  . Obesity (BMI 30.0-34.9) 04/11/2015  . Right medial knee pain 04/11/2015  . Metabolic syndrome 66/29/4765  . Left shoulder pain 04/11/2015  . Umbilical hernia 46/50/3546  . Microcytosis 04/11/2015    Past Surgical History:  Procedure Laterality Date  . KNEE SURGERY     at age 57     Family History  Problem Relation Age of Onset  . Hypertension Mother   . CAD Mother   . Benign prostatic hyperplasia Maternal Uncle   . Kidney disease Neg Hx   . Prostate cancer Neg Hx   . Bladder Cancer Neg Hx   . Kidney cancer Neg Hx     Social History   Socioeconomic History  . Marital status: Married    Spouse name: Santiago Glad  . Number of children: 2  . Years of education: Not on file  . Highest education level: Associate degree: occupational, Hotel manager, or vocational program  Occupational History  . Not on file  Social Needs  . Financial resource strain: Not hard at all  . Food insecurity:    Worry: Never true     Inability: Never true  . Transportation needs:    Medical: No    Non-medical: No  Tobacco Use  . Smoking status: Former Research scientist (life sciences)  . Smokeless tobacco: Former Systems developer    Quit date: 07/29/1987  Substance and Sexual Activity  . Alcohol use: No    Alcohol/week: 0.0 oz  . Drug use: No  . Sexual activity: Yes    Partners: Female  Lifestyle  . Physical activity:    Days per week: 5 days    Minutes per session: 60 min  . Stress: Not at all  Relationships  . Social connections:    Talks on phone: More than three times a week    Gets together: Twice a week    Attends religious service: Never    Active member of club or organization: No    Attends meetings of clubs or organizations: Never    Relationship status: Married  . Intimate partner violence:    Fear of current or ex partner: No    Emotionally abused: No    Physically abused: No    Forced sexual activity: No  Other Topics Concern  . Not on file  Social History Narrative   Patient has 2 step-children     Current Outpatient Medications:  .  aspirin 81  MG chewable tablet, Chew 1 tablet by mouth daily., Disp: , Rfl:  .  Multiple Vitamin tablet, Take 1 tablet by mouth daily., Disp: , Rfl:  .  pravastatin (PRAVACHOL) 40 MG tablet, Take 1 tablet (40 mg total) by mouth daily., Disp: 90 tablet, Rfl: 1 .  quinapril-hydrochlorothiazide (ACCURETIC) 20-12.5 MG tablet, Take 1 tablet by mouth daily., Disp: 90 tablet, Rfl: 0  No Known Allergies   ROS  Constitutional: Negative for fever, positive for mild  weight change.  Respiratory: Negative for cough and shortness of breath.   Cardiovascular: Negative for chest pain or palpitations.  Gastrointestinal: Negative for abdominal pain, no bowel changes.  Musculoskeletal: Negative for gait problem or joint swelling.  Skin: Negative for rash.  Neurological: Negative for dizziness or headache.  No other specific complaints in a complete review of systems (except as listed in HPI  above).  Objective  Vitals:   02/10/18 1103  BP: 118/72  Pulse: 90  Resp: 16  Temp: 98.4 F (36.9 C)  TempSrc: Oral  SpO2: 95%  Weight: 234 lb 11.2 oz (106.5 kg)  Height: 5\' 11"  (1.803 m)    Body mass index is 32.73 kg/m.  Physical Exam  Constitutional: Patient appears well-developed and well-nourished. Obese  No distress.  HEENT: head atraumatic, normocephalic, pupils equal and reactive to light,  neck supple, throat within normal limits Cardiovascular: Normal rate, regular rhythm and normal heart sounds.  No murmur heard. No BLE edema. Pulmonary/Chest: Effort normal and breath sounds normal. No respiratory distress. Abdominal: Soft.  There is no tenderness. Psychiatric: Patient has a normal mood and affect. behavior is normal. Judgment and thought content normal.  Recent Results (from the past 2160 hour(s))  PSA     Status: Abnormal   Collection Time: 12/31/17  8:30 AM  Result Value Ref Range   Prostate Specific Ag, Serum 4.5 (H) 0.0 - 4.0 ng/mL    Comment: Roche ECLIA methodology. According to the American Urological Association, Serum PSA should decrease and remain at undetectable levels after radical prostatectomy. The AUA defines biochemical recurrence as an initial PSA value 0.2 ng/mL or greater followed by a subsequent confirmatory PSA value 0.2 ng/mL or greater. Values obtained with different assay methods or kits cannot be used interchangeably. Results cannot be interpreted as absolute evidence of the presence or absence of malignant disease.      PHQ2/9: Depression screen [facility] 2/9 02/10/2018 04/22/2017 10/17/2016 04/17/2016 01/09/2016  Decreased Interest 0 0 0 0 0  Down, Depressed, Hopeless 0 0 0 0 0  PHQ - 2 Score 0 0 0 0 0  Altered sleeping 0 - - - -  Tired, decreased energy 0 - - - -  Change in appetite 0 - - - -  Feeling bad or failure about yourself  0 - - - -  Trouble concentrating 0 - - - -  Moving slowly or fidgety/restless 0 - - - -  Suicidal  thoughts 0 - - - -  PHQ-9 Score 0 - - - -    Fall Risk: Fall Risk  02/10/2018 04/22/2017 10/17/2016 04/17/2016 01/09/2016  Falls in the past year? No No No No No     Functional Status Survey: Is the patient deaf or have difficulty hearing?: No Does the patient have difficulty seeing, even when wearing glasses/contacts?: No Does the patient have difficulty concentrating, remembering, or making decisions?: No Does the patient have difficulty walking or climbing stairs?: No Does the patient have difficulty dressing or bathing?: No Does  the patient have difficulty doing errands alone such as visiting a doctor's office or shopping?: No    Assessment & Plan  1. Benign hypertension  BP has been towards low end of normal, we will decrease dose of HCTZ - quinapril-hydrochlorothiazide (ACCURETIC) 20-12.5 MG tablet; Take 1 tablet by mouth daily.  Dispense: 90 tablet; Refill: 0 - Comprehensive metabolic panel - CBC with Differential/Platelet  2. Need for Tdap vaccination  - Tdap vaccine greater than or equal to 7yo IM  3. Dyslipidemia  - pravastatin (PRAVACHOL) 40 MG tablet; Take 1 tablet (40 mg total) by mouth daily.  Dispense: 90 tablet; Refill: 1 - Lipid panel  4. Metabolic syndrome  - Hemoglobin A1c  5. Obesity (BMI 30.0-34.9)  Discussed with the patient the risk posed by an increased BMI. Discussed importance of portion control, calorie counting and at least 150 minutes of physical activity weekly. Avoid sweet beverages and drink more water. Eat at least 6 servings of fruit and vegetables daily

## 2018-02-11 ENCOUNTER — Other Ambulatory Visit: Payer: Self-pay | Admitting: Urology

## 2018-02-11 LAB — PATHOLOGY REPORT

## 2018-02-18 ENCOUNTER — Ambulatory Visit (INDEPENDENT_AMBULATORY_CARE_PROVIDER_SITE_OTHER): Payer: 59 | Admitting: Urology

## 2018-02-18 ENCOUNTER — Encounter: Payer: Self-pay | Admitting: Urology

## 2018-02-18 VITALS — BP 119/74 | HR 61 | Ht 71.0 in | Wt 240.9 lb

## 2018-02-18 DIAGNOSIS — E785 Hyperlipidemia, unspecified: Secondary | ICD-10-CM | POA: Diagnosis not present

## 2018-02-18 DIAGNOSIS — I10 Essential (primary) hypertension: Secondary | ICD-10-CM | POA: Diagnosis not present

## 2018-02-18 DIAGNOSIS — C61 Malignant neoplasm of prostate: Secondary | ICD-10-CM

## 2018-02-18 NOTE — Progress Notes (Signed)
02/18/2018 1:08 PM   Jeffrey Whitaker Oct 24, 1960 [id number]  Referring provider: Steele Sizer, MD [address] [place], [place] [address]  Chief Complaint  Patient presents with  . Results    HPI: 57 year old male presents for prostate biopsy follow-up. Biopsy was performed on 02/08/2018 for PSA of 4.5 and mild firmness left prostate.  Prostate volume was 55 g.  Standard 12 core biopsies were performed.  He had no post biopsy complaints.  Pathology: -3/6 right sided cores positive for Gleason 3+4 adenocarcinoma involving 46-81% of submitted tissue -2/6 left-sided cores positive Gleason 3+3 adenocarcinoma involving 45-47% submitted tissue  Refer to the pathology report for details.  PMH: Past Medical History:  Diagnosis Date  . Elevated PSA   . Hypertension     Surgical History: Past Surgical History:  Procedure Laterality Date  . KNEE SURGERY     at age 65     Home Medications:  Allergies as of 02/18/2018   No Known Allergies     Medication List        Accurate as of 02/18/18  1:08 PM. Always use your most recent med list.          aspirin 81 MG chewable tablet Chew 1 tablet by mouth daily.   Multiple Vitamin tablet Take 1 tablet by mouth daily.   pravastatin 40 MG tablet Commonly known as:  PRAVACHOL Take 1 tablet (40 mg total) by mouth daily.   quinapril-hydrochlorothiazide 20-12.5 MG tablet Commonly known as:  ACCURETIC Take 1 tablet by mouth daily.       Allergies: No Known Allergies  Family History: Family History  Problem Relation Age of Onset  . Hypertension Mother   . CAD Mother   . Benign prostatic hyperplasia Maternal Uncle   . Kidney disease Neg Hx   . Prostate cancer Neg Hx   . Bladder Cancer Neg Hx   . Kidney cancer Neg Hx     Social History:  reports that he has quit smoking. He quit smokeless tobacco use about 30 years ago. He reports that he does not drink alcohol or use drugs.  ROS: UROLOGY Frequent  Urination?: No Hard to postpone urination?: No Burning/pain with urination?: No Get up at night to urinate?: No Leakage of urine?: No Urine stream starts and stops?: No Trouble starting stream?: No Do you have to strain to urinate?: No Blood in urine?: No Urinary tract infection?: No Sexually transmitted disease?: No Injury to kidneys or bladder?: No Painful intercourse?: No Weak stream?: No Erection problems?: No Penile pain?: No  Gastrointestinal Nausea?: No Vomiting?: No Indigestion/heartburn?: No Diarrhea?: No Constipation?: No  Constitutional Fever: No Night sweats?: No Weight loss?: No Fatigue?: No  Skin Skin rash/lesions?: No Itching?: No  Eyes Blurred vision?: No Double vision?: No  Ears/Nose/Throat Sore throat?: No Sinus problems?: No  Hematologic/Lymphatic Swollen glands?: No Easy bruising?: No  Cardiovascular Leg swelling?: No Chest pain?: No  Respiratory Cough?: No Shortness of breath?: No  Endocrine Excessive thirst?: No  Musculoskeletal Back pain?: No Joint pain?: No  Neurological Headaches?: No Dizziness?: No  Psychologic Depression?: No Anxiety?: No  Physical Exam: BP 119/74 (BP Location: Left Arm, Patient Position: Sitting, Cuff Size: Large)   Pulse 61   Ht 5\' 11"  (1.803 m)   Wt 240 lb 14.4 oz (109.3 kg)   BMI 33.60 kg/m   Constitutional:  Alert and oriented, No acute distress.   Assessment & Plan:   57 year old male with moderate risk prostate cancer.  He does have significant disease volume and based on his age I would recommend curative treatment.  We discussed both radiation modalities and radical prostatectomy.  He was provided literature on treatment options for prostate cancer.  I offered him an appointment and radiation oncology which he will think over.  If he desires to proceed with radical prostatectomy will set him up to see Dr. Erlene Quan.   Greater than 50% of this 15-minute visit was spent counseling the  patient.   Abbie Sons, [place] [address], [place], [place] [address] [phone]

## 2018-02-19 LAB — COMPREHENSIVE METABOLIC PANEL
A/G RATIO: 1.7 (ref 1.2–2.2)
ALT: 15 IU/L (ref 0–44)
AST: 17 IU/L (ref 0–40)
Albumin: 4.5 g/dL (ref 3.5–5.5)
Alkaline Phosphatase: 41 IU/L (ref 39–117)
BUN/Creatinine Ratio: 15 (ref 9–20)
BUN: 19 mg/dL (ref 6–24)
Bilirubin Total: 0.5 mg/dL (ref 0.0–1.2)
CALCIUM: 9.9 mg/dL (ref 8.7–10.2)
CHLORIDE: 101 mmol/L (ref 96–106)
CO2: 25 mmol/L (ref 20–29)
Creatinine, Ser: 1.23 mg/dL (ref 0.76–1.27)
GFR calc Af Amer: 75 mL/min/{1.73_m2} (ref >59)
GFR, EST NON AFRICAN AMERICAN: 65 mL/min/{1.73_m2} (ref >59)
Globulin, Total: 2.7 g/dL (ref 1.5–4.5)
Glucose: 95 mg/dL (ref 65–99)
POTASSIUM: 4.4 mmol/L (ref 3.5–5.2)
Sodium: 141 mmol/L (ref 134–144)
Total Protein: 7.2 g/dL (ref 6.0–8.5)

## 2018-02-19 LAB — CBC WITH DIFFERENTIAL/PLATELET
BASOS ABS: 0 10*3/uL (ref 0.0–0.2)
BASOS: 0 %
EOS (ABSOLUTE): 0.1 10*3/uL (ref 0.0–0.4)
Eos: 2 %
Hematocrit: 44.2 % (ref 37.5–51.0)
Hemoglobin: 14 g/dL (ref 13.0–17.7)
IMMATURE GRANS (ABS): 0 10*3/uL (ref 0.0–0.1)
IMMATURE GRANULOCYTES: 0 %
LYMPHS: 28 %
Lymphocytes Absolute: 1.8 10*3/uL (ref 0.7–3.1)
MCH: 24.1 pg — ABNORMAL LOW (ref 26.6–33.0)
MCHC: 31.7 g/dL (ref 31.5–35.7)
MCV: 76 fL — ABNORMAL LOW (ref 79–97)
Monocytes Absolute: 0.5 10*3/uL (ref 0.1–0.9)
Monocytes: 7 %
NEUTROS PCT: 63 %
Neutrophils Absolute: 4 10*3/uL (ref 1.4–7.0)
PLATELETS: 206 10*3/uL (ref 150–450)
RBC: 5.8 x10E6/uL (ref 4.14–5.80)
RDW: 14.7 % (ref 12.3–15.4)
WBC: 6.5 10*3/uL (ref 3.4–10.8)

## 2018-02-19 LAB — HEMOGLOBIN A1C
ESTIMATED AVERAGE GLUCOSE: 126 mg/dL
HEMOGLOBIN A1C: 6 % — AB (ref 4.8–5.6)

## 2018-02-19 LAB — LIPID PANEL
Chol/HDL Ratio: 2.8 ratio (ref 0.0–5.0)
Cholesterol, Total: 150 mg/dL (ref 100–199)
HDL: 53 mg/dL (ref >39)
LDL Calculated: 82 mg/dL (ref 0–99)
TRIGLYCERIDES: 77 mg/dL (ref 0–149)
VLDL Cholesterol Cal: 15 mg/dL (ref 5–40)

## 2018-02-21 ENCOUNTER — Encounter: Payer: Self-pay | Admitting: Urology

## 2018-03-16 ENCOUNTER — Encounter: Payer: Self-pay | Admitting: Urology

## 2018-03-16 ENCOUNTER — Ambulatory Visit (INDEPENDENT_AMBULATORY_CARE_PROVIDER_SITE_OTHER): Payer: 59 | Admitting: Urology

## 2018-03-16 VITALS — BP 123/76 | HR 67 | Ht 71.0 in | Wt 230.0 lb

## 2018-03-16 DIAGNOSIS — C61 Malignant neoplasm of prostate: Secondary | ICD-10-CM

## 2018-03-16 DIAGNOSIS — K429 Umbilical hernia without obstruction or gangrene: Secondary | ICD-10-CM | POA: Diagnosis not present

## 2018-03-16 LAB — URINALYSIS, COMPLETE
BILIRUBIN UA: NEGATIVE
GLUCOSE, UA: NEGATIVE
KETONES UA: NEGATIVE
Leukocytes, UA: NEGATIVE
Nitrite, UA: NEGATIVE
Protein, UA: NEGATIVE
RBC UA: NEGATIVE
SPEC GRAV UA: 1.02 (ref 1.005–1.030)
UUROB: 0.2 mg/dL (ref 0.2–1.0)
pH, UA: 5 (ref 5.0–7.5)

## 2018-03-16 NOTE — Progress Notes (Signed)
03/16/2018 5:00 PM   Jeffrey Whitaker [birth year] [id number]  Referring provider: Steele Sizer, MD [address] [place], [place] [address]  Chief Complaint  Patient presents with  . Prostate Cancer    discuss surgery    HPI: 57 year old male with intermediate risk prostate cancer who presents today to for further discussion of possible radical prostatectomy.  He was previously seen, evaluated, and biopsy by Dr. Bernardo Heater.  He initially was seen and evaluated for PSA of 4.5 as well as firmness of the left prostate.  He underwent prostate biopsy on 02/08/2018 revealing 5/12 cores, 3 of 6 on the right side with Gleason 3+4 involving 46 to 81% of the tissue, and 2 of 6 on the left with Gleason 3+3.  Prostate volume 55 g.  He has no significant baseline voiding symptoms.  IPSS as below.  No baseline erectile dysfunction, SHIM as below.    IPSS    Row Name 03/16/18 1600         International Prostate Symptom Score   How often have you had the sensation of not emptying your bladder?  Not at All     How often have you had to urinate less than every two hours?  Less than 1 in 5 times     How often have you found you stopped and started again several times when you urinated?  Less than 1 in 5 times     How often have you found it difficult to postpone urination?  Not at All     How often have you had a weak urinary stream?  Less than 1 in 5 times     How often have you had to strain to start urination?  Not at All     How many times did you typically get up at night to urinate?  1 Time     Total IPSS Score  4       Quality of Life due to urinary symptoms   If you were to spend the rest of your life with your urinary condition just the way it is now how would you feel about that?  Delighted        Score:  1-7 Mild 8-19 Moderate 20-35 Severe   SHIM    Row Name 03/16/18 1624         SHIM: Over the last 6 months:   How do you rate your confidence that you  could get and keep an erection?  Very High     When you had erections with sexual stimulation, how often were your erections hard enough for penetration (entering your partner)?  Almost Always or Always     During sexual intercourse, how often were you able to maintain your erection after you had penetrated (entered) your partner?  Almost Always or Always     During sexual intercourse, how difficult was it to maintain your erection to completion of intercourse?  Not Difficult     When you attempted sexual intercourse, how often was it satisfactory for you?  Most Times (much more than half the time)       SHIM Total Score   SHIM  24         PMH: Past Medical History:  Diagnosis Date  . Elevated PSA   . Hypertension     Surgical History: Past Surgical History:  Procedure Laterality Date  . KNEE SURGERY     at age 42     Home  Medications:  Allergies as of 03/16/2018   No Known Allergies     Medication List        Accurate as of 03/16/18  5:00 PM. Always use your most recent med list.          aspirin 81 MG chewable tablet Chew 1 tablet by mouth daily.   Multiple Vitamin tablet Take 1 tablet by mouth daily.   pravastatin 40 MG tablet Commonly known as:  PRAVACHOL Take 1 tablet (40 mg total) by mouth daily.   quinapril-hydrochlorothiazide 20-12.5 MG tablet Commonly known as:  ACCURETIC Take 1 tablet by mouth daily.       Allergies: No Known Allergies  Family History: Family History  Problem Relation Age of Onset  . Hypertension Mother   . CAD Mother   . Benign prostatic hyperplasia Maternal Uncle   . Kidney disease Neg Hx   . Prostate cancer Neg Hx   . Bladder Cancer Neg Hx   . Kidney cancer Neg Hx     Social History:  reports that he has quit smoking. He quit smokeless tobacco use about 30 years ago. He reports that he does not drink alcohol or use drugs.  ROS: UROLOGY Frequent Urination?: No Hard to postpone urination?: No Burning/pain with  urination?: No Get up at night to urinate?: No Leakage of urine?: No Urine stream starts and stops?: No Trouble starting stream?: No Do you have to strain to urinate?: No Blood in urine?: No Urinary tract infection?: No Sexually transmitted disease?: No Injury to kidneys or bladder?: No Painful intercourse?: No Weak stream?: No Erection problems?: No Penile pain?: No  Gastrointestinal Nausea?: No Vomiting?: No Indigestion/heartburn?: No Diarrhea?: No Constipation?: No  Constitutional Fever: No Night sweats?: No Weight loss?: No Fatigue?: No  Skin Skin rash/lesions?: No Itching?: No  Eyes Blurred vision?: No Double vision?: No  Ears/Nose/Throat Sore throat?: No Sinus problems?: No  Hematologic/Lymphatic Swollen glands?: No Easy bruising?: No  Cardiovascular Leg swelling?: No Chest pain?: No  Respiratory Cough?: No Shortness of breath?: No  Endocrine Excessive thirst?: No  Musculoskeletal Back pain?: No Joint pain?: No  Neurological Headaches?: No Dizziness?: No  Psychologic Depression?: No Anxiety?: No  Physical Exam: BP 123/76   Pulse 67   Ht 5\' 11"  (1.803 m)   Wt 230 lb (104.3 kg)   BMI 32.08 kg/m   Constitutional:  Alert and oriented, No acute distress. HEENT: [place] AT, moist mucus membranes.  Trachea midline, no masses. Cardiovascular: No clubbing, cyanosis, or edema. Respiratory: Normal respiratory effort, no increased work of breathing. GI: Abdomen is soft, nontender, nondistended, no abdominal masses.  Small quarter sized umbilical hernia, likely fat-containing.   Skin: No rashes, bruises or suspicious lesions. Neurologic: Grossly intact, no focal deficits, moving all 4 extremities. Psychiatric: Normal mood and affect.  Laboratory Data: Lab Results  Component Value Date   WBC 6.5 02/18/2018   HGB 14.0 02/18/2018   HCT 44.2 02/18/2018   MCV 76 (L) 02/18/2018   PLT 206 02/18/2018    Lab Results  Component Value Date    CREATININE 1.23 02/18/2018    Lab Results  Component Value Date   PSA 3.2 12/12/2014    Lab Results  Component Value Date   TESTOSTERONE 400 04/17/2016    Lab Results  Component Value Date   HGBA1C 6.0 (H) 02/18/2018    Urinalysis    Component Value Date/Time   APPEARANCEUR Clear 03/16/2018 1547   GLUCOSEU Negative 03/16/2018 1547   BILIRUBINUR Negative  03/16/2018 1547   PROTEINUR Negative 03/16/2018 1547   NITRITE Negative 03/16/2018 1547   LEUKOCYTESUR Negative 03/16/2018 1547     Pertinent Imaging: n/a  Assessment & Plan:    1. Prostate cancer [facility]) 57 year old male with newly diagnosed intermediate risk prostate cancer.  MSK nomogram reviewed, approximately 5% risk of lymph node involvement.  No indication for further diagnostic imaging based on guidelines.  The patient was counseled about the natural history of prostate cancer and the standard treatment options that are available for prostate cancer. It was explained to him how his age and life expectancy, clinical stage, Gleason score, and PSA affect his prognosis, the decision to proceed with additional staging studies, as well as how that information influences recommended treatment strategies. We discussed the roles for active surveillance, radiation therapy, surgical therapy, androgen deprivation, as well as ablative therapy options for the treatment of prostate cancer as appropriate to his individual cancer situation. We discussed the risks and benefits of these options with regard to their impact on cancer control and also in terms of potential adverse events, complications, and impact on quality of life particularly related to urinary, bowel, and sexual function. The patient was encouraged to ask questions throughout the discussion today and all questions were answered to his stated satisfaction. In addition, the patient was providedwith and/or directed to appropriate resources and literature for further education  about prostate cancer treatment options.  We discussed surgical therapy for prostate cancer including the different available surgical approaches. We discussed, in detail, the risks and expectations of surgery with regard to cancer control, urinary control, and erectile dysfunction as well as expected post operative cover he processed. Additional risks of surgery including but not limited to bleeding, infection, hernia formation, nerve damage, steel formation, bowel/rect injury, potentially necessitating colostomy, damage to the urinary tract resulting in urinary leakage, urethral stricture, and cardiopulmonary risk such as myocardial infarction, stroke, death, thromboembolism etc. were explained. The risk of open surgical conversion for robotics/laparoscopic prostatectomy is also discussed.  Given his age, I do feel that he would be best served with surgery as compared to radiation with the option for salvage/adjuvant radiation for adverse pathological features or recurrence.  He understands this and is agreeable with the plan.  He was offered referral to radiation oncology but declined.  Given that his more significant disease is unilateral, we will plan for a nerve sparing procedure on the side of his low risk cancer with either partial or non-nerve sparing on the side of his more significant disease, to be determined at the time of surgery based on intraoperative findings.  He is agreeable this plan.  All questions were answered today in detail.   2. Umbilical hernia without obstruction and without gangrene Small asymptomatic umbilical hernia He is interested in having this treated at the time of surgery We will recheck to general surgery to see if they like to be involved with the surgery to close the hernia - otherwise will close with nonabsorbable suture at the time of port closure - Ambulatory referral to [facility] [place] [address], [place], [place] [address] [phone]  I spent 40 min with this patient of which greater than 50% was spent in counseling and coordination of care with the patient.

## 2018-03-18 ENCOUNTER — Ambulatory Visit (INDEPENDENT_AMBULATORY_CARE_PROVIDER_SITE_OTHER): Payer: 59 | Admitting: Surgery

## 2018-03-18 ENCOUNTER — Encounter: Payer: Self-pay | Admitting: Surgery

## 2018-03-18 ENCOUNTER — Telehealth: Payer: Self-pay | Admitting: Family Medicine

## 2018-03-18 ENCOUNTER — Other Ambulatory Visit: Payer: Self-pay | Admitting: Radiology

## 2018-03-18 VITALS — BP 112/69 | HR 58 | Temp 97.5°F | Ht 71.0 in | Wt 236.0 lb

## 2018-03-18 DIAGNOSIS — K429 Umbilical hernia without obstruction or gangrene: Secondary | ICD-10-CM | POA: Diagnosis not present

## 2018-03-18 DIAGNOSIS — C61 Malignant neoplasm of prostate: Secondary | ICD-10-CM

## 2018-03-18 NOTE — Patient Instructions (Signed)
Please give us a call in case you have any questions or concerns.  

## 2018-03-18 NOTE — Progress Notes (Signed)
Surgical Clinic History and Physical  Referring provider:  Steele Sizer, MD [address] [place], [place] [address]  HISTORY OF PRESENT ILLNESS (HPI):  57 y.o. male presents for evaluation of his umbilical hernia. 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 Rather, patient specifically states he'd forgotten it was even there until it was pointed out to him by a physician during a recent physical exam. He denies constipation, blood per rectum, unintentional weight loss, frequent coughing, or straining with urination, though he reports urinary frequency and urgency in the context of elevated PSA and prostrate biopsy, which demonstrated prostate cancer for which he is pending surgery with Dr. Erlene Quan next month (9/30). Patient otherwise denies abdominal pain, distention, N/V, or change in BM's.  PAST MEDICAL HISTORY (PMH):  Past Medical History:  Diagnosis Date  . Elevated PSA   . Hypertension      PAST SURGICAL HISTORY ([place]):  Past Surgical History:  Procedure Laterality Date  . KNEE SURGERY     at age 45      MEDICATIONS:  Prior to Admission medications   Medication Sig Start Date End Date Taking? Authorizing Provider  aspirin 81 MG chewable tablet Chew 1 tablet by mouth daily. 07/21/07  Yes [provider]  Multiple Vitamin tablet Take 1 tablet by mouth daily. 09/09/10  Yes [provider]  pravastatin (PRAVACHOL) 40 MG tablet Take 1 tablet (40 mg total) by mouth daily. 02/10/18  Yes Sowles, Drue Stager, MD  quinapril-hydrochlorothiazide (ACCURETIC) 20-12.5 MG tablet Take 1 tablet by mouth daily. 02/10/18  Yes Steele Sizer, MD     ALLERGIES:  No Known Allergies   SOCIAL HISTORY:  Social History   Socioeconomic History  . Marital status: Married    Spouse name:  Santiago Glad  . Number of children: 2  . Years of education: Not on file  . Highest education level: Associate degree: occupational, Hotel manager, or vocational program  Occupational History  . Not on file  Social Needs  . Financial resource strain: Not hard at all  . Food insecurity:    Worry: Never true    Inability: Never true  . Transportation needs:    Medical: No    Non-medical: No  Tobacco Use  . Smoking status: Former Research scientist (life sciences)  . Smokeless tobacco: Former Systems developer    Quit date: 07/29/1987  Substance and Sexual Activity  . Alcohol use: No    Alcohol/week: 0.0 standard drinks  . Drug use: No  . Sexual activity: Yes    Partners: Female  Lifestyle  . Physical activity:    Days per week: 5 days    Minutes per session: 60 min  . Stress: Not at all  Relationships  . Social connections:    Talks on phone: More than three times a week    Gets together: Twice a week    Attends religious service: Never    Active member of club or organization: No    Attends meetings of clubs or organizations: Never    Relationship status: Married  . Intimate partner violence:    Fear of current or ex partner: No    Emotionally abused: No    Physically abused: No    Forced sexual activity: No  Other Topics Concern  . Not on file  Social History Narrative   Patient  has 2 step-children    The patient currently resides (home / rehab facility / nursing home): Home The patient normally is (ambulatory / bedbound): Ambulatory  FAMILY HISTORY:  Family History  Problem Relation Age of Onset  . Hypertension Mother   . CAD Mother   . Benign prostatic hyperplasia Maternal Uncle   . Kidney disease Neg Hx   . Prostate cancer Neg Hx   . Bladder Cancer Neg Hx   . Kidney cancer Neg Hx     Otherwise negative/non-contributory.  REVIEW OF SYSTEMS:  Constitutional: denies any other weight loss, fever, chills, or sweats  Eyes: denies any other vision changes, history of eye injury  ENT: denies sore throat,  hearing problems  Respiratory: denies shortness of breath, wheezing  Cardiovascular: denies chest pain, palpitations  Gastrointestinal: abdominal pain, N/V, and bowel function as per HPI Musculoskeletal: denies any other joint pains or cramps  Skin: Denies any other rashes or skin discolorations Neurological: denies any other headache, dizziness, weakness  Psychiatric: Denies any other depression, anxiety   All other review of systems were otherwise negative   VITAL SIGNS:  BP 112/69   Pulse (!) 58   Temp (!) 97.5 F (36.4 C) (Oral)   Ht 5\' 11"  (1.803 m)   Wt 236 lb (107 kg)   BMI 32.92 kg/m   PHYSICAL EXAM:  Constitutional:  -- Normal body habitus  -- Awake, alert, and oriented x3  Eyes:  -- Pupils equally round and reactive to light  -- No scleral icterus  Ear, nose, throat:  -- No jugular venous distension -- No nasal drainage, bleeding Pulmonary:  -- No crackles  -- Equal breath sounds bilaterally -- Breathing non-labored at rest Cardiovascular:  -- S1, S2 present  -- No pericardial rubs  Gastrointestinal:  -- Easily reducible and completely non-tender umbilical hernia -- Abdomen soft, nontender, non-distended, no guarding/rebound  -- No other abdominal masses appreciated, pulsatile or otherwise  Musculoskeletal and Integumentary:  -- Wounds or skin discoloration: None appreciated -- Extremities: B/L UE and LE FROM, hands and feet warm, no edema  Neurologic:  -- Motor function: Intact and symmetric -- Sensation: Intact and symmetric  Labs:  CBC Latest Ref Rng & Units 02/18/2018 10/15/2015  WBC 3.4 - 10.8 x10E3/uL 6.5 8.7  Hemoglobin 13.0 - 17.7 g/dL 14.0 14.1  Hematocrit 37.5 - 51.0 % 44.2 43.6  Platelets 150 - 450 x10E3/uL 206 272   CMP Latest Ref Rng & Units 02/18/2018 10/18/2016 10/17/2016  Glucose 65 - 99 mg/dL 95 - 93  BUN 6 - 24 mg/dL 19 20 20   Creatinine 0.76 - 1.27 mg/dL 1.23 1.3 1.27  Sodium 134 - 144 mmol/L 141 - 140  Potassium 3.5 - 5.2 mmol/L  4.4 - 4.7  Chloride 96 - 106 mmol/L 101 - 99  CO2 20 - 29 mmol/L 25 - 25  Calcium 8.7 - 10.2 mg/dL 9.9 - 9.7  Total Protein 6.0 - 8.5 g/dL 7.2 - 7.2  Total Bilirubin 0.0 - 1.2 mg/dL 0.5 - 0.6  Alkaline Phos 39 - 117 IU/L 41 - 41  AST 0 - 40 IU/L 17 - 23  ALT 0 - 44 IU/L 15 - 24    Imaging studies: No new pertinent imaging studies available for review   Assessment/Plan:  57 y.o. male with essentially asymptomatic easily reducible umbilical hernia and upcoming surgery for prostate cancer, complicated by co-morbidities including HTN.              - discussed with  patient signs and symptoms of hernia incarceration and obstruction             - strategies for manual self-reduction of patient's umbilical hernia also reviewed and discussed  - maintain hydration with high fiber heart healthy diet to reduce/minimize constipation +/- daily stool softener as needed             - all risks, benefits, and alternatives to elective repair of umbilical hernia, potentially during same anesthesia as upcoming surgery for prostate cancer, were discussed with the patient, and all of his questions were answered to patient's expressed satisfaction, patient expresses he does not wish to proceed with repair of his umbilical hernia at this time.             - return to clinic as needed, instructed to call if any questions or concerns  All of the above recommendations were discussed with the patient, and all of patient's questions were answered to his expressed satisfaction.  Thank you for the opportunity to participate in this patient's care.  -- Marilynne Drivers Rosana Hoes, MD, [place]: [facility] - Partnering for exceptional care. Office: [phone]

## 2018-03-18 NOTE — Telephone Encounter (Signed)
Patient came by to check the status of his wellness form completion.  Patient wanted to know if there were any issues and if it will faxed over to his employer.

## 2018-03-19 LAB — CULTURE, URINE COMPREHENSIVE

## 2018-03-19 NOTE — Telephone Encounter (Signed)
I have not seen his form and it looks like his labs has not been completed.  Do you know anything about this?

## 2018-03-19 NOTE — Telephone Encounter (Signed)
It was for the obesity. I did it yesterday

## 2018-03-22 ENCOUNTER — Encounter: Payer: Self-pay | Admitting: Surgery

## 2018-03-23 ENCOUNTER — Telehealth: Payer: Self-pay | Admitting: Radiology

## 2018-03-23 NOTE — Telephone Encounter (Signed)
Spoke with patient about appointment with general surgery & hernia repair. Patient states Dr Rosana Hoes didn't feel it was needed at this time & he would like to proceed with robot assisted laparoscopic prostatectomy with bilateral pelvic lymph node dissection without the hernia repair.

## 2018-03-24 ENCOUNTER — Other Ambulatory Visit: Payer: Self-pay

## 2018-03-24 ENCOUNTER — Ambulatory Visit: Payer: 59 | Attending: Urology | Admitting: Physical Therapy

## 2018-03-24 DIAGNOSIS — R293 Abnormal posture: Secondary | ICD-10-CM | POA: Insufficient documentation

## 2018-03-24 DIAGNOSIS — R279 Unspecified lack of coordination: Secondary | ICD-10-CM | POA: Diagnosis not present

## 2018-03-24 DIAGNOSIS — M6281 Muscle weakness (generalized): Secondary | ICD-10-CM | POA: Diagnosis present

## 2018-03-24 NOTE — Patient Instructions (Signed)
Deep core level 1 ( 10 breaths with pelvic lengthening on inhale, lifting gentle on exhale)  Morning and night and after workouts    _____   Lengthened breath while walking with more weight down into the ground, shoulders over navel, navel over shins   Bring your back next time  _____   Decrease downward forces onto pelvic floor: Log roll out of bed instead of jumping out of bed with a crunch Hold off on crunches and sit ups  Exhale as you liftweight  or as you get out of the chair (against gravity and loads)   Stand in small ski track stance ( hip width apart) while bicep curls wo minimize perturbation of the spine

## 2018-03-25 NOTE — Therapy (Addendum)
[place] MAIN [facility] SERVICES [address] [place], [place], [address] Phone: [phone]   Fax:  [phone]  Physical Therapy Evaluation  Patient Details  Name: Jeffrey Whitaker MRN: [id number] Date of Birth: 07-18-1961 Referring Provider: [name] MD   Encounter Date: 03/24/2018    Past Medical History:  Diagnosis Date  . Elevated PSA   . Hypertension     Past Surgical History:  Procedure Laterality Date  . KNEE SURGERY     at age 44     There were no vitals filed for this visit.   Subjective Assessment - 03/24/18 1413    Subjective No pain. Pt is scheduled for prostate cancer 04/26/18. Denied difficulty with eliminating urine, incontinence, Pt will be hiking 77 miles starting 9/14 and return 04/17/18. Pt has been training 8-10 miles on the weekends the past months while carrying with 38 lb backpack on his back. Pt has been doing situps, crunches, step ups, biceps with dumbbells 25-30 lbs, elliptical 20 mi-30 min.  Pt has a stretching routine 10-15 min prior and after workout.  Pt would like to isometric strengthening after surgery. Pt has questions about what he can do with weights while under the weight lifting restrictions post surgery     Pertinent History  Denied LBP, injuries to knees, ankle, feet.  R knee surgery at the age 57-62 year old.   Pt has had a umbilical hernia for over 10 years.   Patient Stated Goals  get back into the routine with exercises after surgery         [facility] PT Assessment - 04/05/18 1952      Assessment   Medical Diagnosis  Prostate Cancer     Referring Provider  [name] MD      Precautions   Precautions  None      Restrictions   Weight Bearing Restrictions  No      Observation/Other Assessments   Observations  ankles crossed, slumped sitting      Coordination   Gross Motor Movements are Fluid and Coordinated  --   lumbopelvic perturbationw/ posterior sling/glut test      Sit to  Stand   Comments  R knee flexion limited, with more weight shift onto L ischial tuberosity       Other:   Other/ Comments  weight lifting dumbbells, kettle bell with breathholding,       AROM   Overall AROM   --   R knee flex 105 deg, L 75 deg in supine     Strength   Overall Strength  --   hip abd B3+/5,Rhip/kneeflex/ext 4-/5,L5/5,hipext L 4-,R4+               Objective measurements completed on examination: See above findings.    Pelvic Floor Special Questions - 03/25/18 1429    Diastasis Recti  slight separation with 3 fingers depth below sternum,  Umbilical hernia inside     External Perineal Exam  through clothing, limited pelvic floor cranial movement with cue for contraction        [facility] Adult PT Treatment/Exercise - 04/05/18 1952      Ambulation/Gait   Gait Comments  decreased deep core/ lowerkinetich chain co-activation, posteior COM , excessive lumbar mobility                PT Short Term Goals - 04/05/18 1947      PT SHORT TERM GOAL #1   Title  Pt wil  demo proper body mechanics to minimize straining of abdomen and pelvic floor ( lifting dumbbells, kettlebells, stand<> floor and sit to stand t/f and lifting 40 lb back bag from ground to shoulders)     Time  4    Period  Weeks    Status  New      PT SHORT TERM GOAL #2   Title  Pt will demo proper deep core coordination with proper diaphragmatic excursion in order to optimize postural stability and pelvic floor function    Time  2    Period  Weeks    Status  New      PT SHORT TERM GOAL #3   Title  Pt will demo IND with flexibility HEP w/ modificaitons to R knee limited ROM to utilize during 77 miles hiking trip in order to maintain proper pelvic floor ROM and minimize overactivity of lower kinetic chain      Time  4    Period  Weeks    Status  New                Plan - 03/25/18 1442    Clinical Impression Statement  Pt is a 57 yo male who is scheduled for prostate cancer on  04/26/18. Upon assessment, pt demo'd deficits that increase his risk for urinary incontinence post surgery and places him at risk for worsening of his umbilicus hernia:  _limited R knee flexion with altered sit to stand t/f and gait _poor deep core/ thoracolumbar/ lower extremity strength _poor body mechanics and co-activation of deep core mm which place downward forces onto pelvic floor  _poor alignment and techniques with fitness exercises _poor educations on alternatives to sit-up/ crunches to decrease downward forces onto abdominal and pelvic floor mm _diastasis recti of upper rectus mm    These deficits indicate a sign of inefficient intra-abdominal pressure system which is associated with urinary incontinence, increased risk for injuries, and risks for worsening of his umbilical hernia and R knee issues. Following Tx today, pt showed proper techniques with functional activities from sit to stand to lifting dumbbells. Plan to ensure proper flexibility HEP and progress with deep core/ thoracolumbar strengthening at next session. Pt was asked to bring in his hiking backpack at next session for proper assessment because pt will be hiking 77 miles across 1 week in 2 weeks. Pt will benefit from skilled PT to address his deficits.      Clinical Presentation  Evolving    Clinical Decision Making  Moderate    Rehab Potential  Good    PT Frequency  1x / week    PT Duration  12 weeks    PT Treatment/Interventions  Balance training;Therapeutic exercise;Neuromuscular re-education;Stair training;Gait training;Moist Heat;Patient/family education;Therapeutic activities;Functional mobility training;Manual techniques;Biofeedback;Dry needling;Scar mobilization;Taping    Consulted and Agree with Plan of Care  Patient       Patient will benefit from skilled therapeutic intervention in order to improve the following deficits and impairments:  Improper body mechanics, Increased muscle spasms, Postural  dysfunction, Decreased coordination, Decreased mobility, Decreased range of motion, Decreased endurance, Difficulty walking, Decreased strength, Abnormal gait  Visit Diagnosis: Muscle weakness (generalized) - Plan: PT plan of care cert/re-cert  Abnormal posture - Plan: PT plan of care cert/re-cert  Unspecified lack of coordination - Plan: PT plan of care cert/re-cert     Problem List Patient Active Problem List   Diagnosis Date Noted  . Allergic rhinitis, seasonal 04/15/2015  . Abnormal blood sugar 04/11/2015  . Benign  hypertension 04/11/2015  . Dyslipidemia 04/11/2015  . Obesity (BMI 30.0-34.9) 04/11/2015  . Right medial knee pain 04/11/2015  . Metabolic syndrome 51/89/8421  . Left shoulder pain 04/11/2015  . Umbilical hernia 10/06/8116  . Microcytosis 04/11/2015    Jerl Mina ,PT, DPT, E-RYT  04/05/2018, 7:53 PM  [place] MAIN [facility] SERVICES [address] [place], [place], [address] Phone: [phone]   Fax:  [phone]  Name: BERKELEY VANAKEN MRN: [id number] Date of Birth: [birth year]

## 2018-03-31 ENCOUNTER — Ambulatory Visit: Payer: 59 | Attending: Urology | Admitting: Physical Therapy

## 2018-03-31 DIAGNOSIS — M6281 Muscle weakness (generalized): Secondary | ICD-10-CM | POA: Diagnosis not present

## 2018-03-31 DIAGNOSIS — R279 Unspecified lack of coordination: Secondary | ICD-10-CM | POA: Insufficient documentation

## 2018-03-31 DIAGNOSIS — R293 Abnormal posture: Secondary | ICD-10-CM | POA: Insufficient documentation

## 2018-03-31 NOTE — Patient Instructions (Addendum)
IN THE TENT IN THE MORNING AND NIGHT   Sidelying:  1) open book (handout) increasing thoracic mobility 2) Clam Shell 45 Degrees -Hip abduction/ external rotation strengthening Lying with hips and knees bent 45, one pillow between knees and ankles. Lift knee with exhale. Be sure pelvis does not roll backward. Do not arch back. Do 10 times, each leg, 2 times per day. Do other side for the above 2 exercise   On your back:  Complimentary stretches to clam shells:  1) Cross thigh over thigh, exhale to hug the thighs in with arms pulling back of thigh, shoulders/ head is relaxed down , Use towel behind thigh is needed.  10 reps  2) Cross _ ankle overopposite thigh, opposite knee bent, foot on bed ( Figure-4)  exhale to hug the thighs in with arms pulling back of thigh, shoulders/ head is relaxed down , Use towel behind thigh is needed.  10 reps  3) Reclined twist for hips and side of the hips/ legs  Lay on your back, knees bend Scoot hips to the R , leave shoulders in place Drop knees to the L side resting onto pillows to keep leg at the same width of hips Pillow under L thigh to minimize too much strain    __  4) deep core breathing level 1 10 reps 5) deep core breathing level 2 ( 6 min)   __________________ Stretches every 2 hours   Back body: Calves with toes up, ankle up, knee extended     Calves/ hip flexor with lunge position except back heel is down   Hamstrings ( wide legs, knees slight bent) bending forward   minisquat--> cross thigh over ( eagle pose)    Lunge position ( toes point forward) 50% weight on both legs with twist towards front thigh, inhale lengthening first, exhale twist at midback   Or twist towards leg on tree     Lunge position ( toes point forward) 50% weight on both legs with ( extended side angle)     Quad stretch with fanny pack to help raise ankle up towards buttocks     Minisquat, hands behind back: pull shoulders back and  down, chest lifts      Arm swings without moving hips    Traction with yoga belt  ([email or web address]) 9 feet long Down ward facing dog with strap on other side of the door  Chair in front for hands Knees slightly bent and allow the belt to pull hips back     _____  Lifting back pack from side in a mini squat position and placing onto top of thigh before slipping onto arms

## 2018-04-02 NOTE — Therapy (Signed)
[place] MAIN [facility] SERVICES [address] [place], [place], [address] Phone: [phone]   Fax:  [phone]  Physical Therapy Treatment  Patient Details  Name: Jeffrey Whitaker MRN: [id number] Date of Birth: Jul 30, 1960 Referring Provider: [name], MD   Encounter Date: 03/31/2018    Past Medical History:  Diagnosis Date  . Elevated PSA   . Hypertension     Past Surgical History:  Procedure Laterality Date  . KNEE SURGERY     at age 57     There were no vitals filed for this visit.  Subjective Assessment - 04/02/18 1233    Subjective  Pt reported he has practiced the breathing technique with walking and lifting weights    Pertinent History  Denied LBP, injuries to knees, ankle, feet.  R knee surgery at the age 44-27 year old.     Patient Stated Goals  get back into the routine with exercises after surgery         [facility] PT Assessment - 04/02/18 1235      Observation/Other Assessments   Observations  limited hip ext with quad stretch, hyperextended knees in stretches       Coordination   Gross Motor Movements are Fluid and Coordinated  --   minimal cues for deep core exercise     Other:   Other/ Comments  poor lifting mechanics of heavy hiking pack ,with straining of abdominal mm pelvic floor mm                    [facility] Adult PT Treatment/Exercise - 04/02/18 1234      Therapeutic Activites    Therapeutic Activities  --   see pt instructions     Neuro Re-ed    Neuro Re-ed Details   see pt instructions                          Plan - 04/02/18 1235    Clinical Impression Statement  Provided hiking back pack assessment and proper lifting of heavy back pack with minimal straining of pelvic floor mm and minimized risk for shoulder injuries.  Added lower kinetic chain flexibility stretches for rest break every 2 hours on his extensive hiking trip.  Pt demo'd correctly HEP.  Anticipate this  HEP which pt can maintain while on his extended hiking trip will help minimize overactivity of pelvic floor/ minimize straining abdominal mm/ risk for worsening umbilical hernia to yield better results post prostate surgery which pt will undergo after his weeklong hiking trip. Pt continues to benefit from skilled PT.     Rehab Potential  Good    PT Frequency  1x / week    PT Duration  12 weeks    PT Treatment/Interventions  Balance training;Therapeutic exercise;Neuromuscular re-education;Stair training;Gait training;Moist Heat;Patient/family education;Therapeutic activities;Functional mobility training;Manual techniques;Biofeedback;Dry needling;Scar mobilization;Taping    Consulted and Agree with Plan of Care  Patient       Patient will benefit from skilled therapeutic intervention in order to improve the following deficits and impairments:  Improper body mechanics, Increased muscle spasms, Postural dysfunction, Decreased coordination, Decreased mobility, Decreased range of motion, Decreased endurance, Difficulty walking, Decreased strength, Abnormal gait  Visit Diagnosis: Muscle weakness (generalized)  Abnormal posture  Unspecified lack of coordination     Problem List Patient Active Problem List   Diagnosis Date Noted  . Allergic rhinitis, seasonal 04/15/2015  . Abnormal blood sugar 04/11/2015  . Benign  hypertension 04/11/2015  . Dyslipidemia 04/11/2015  . Obesity (BMI 30.0-34.9) 04/11/2015  . Right medial knee pain 04/11/2015  . Metabolic syndrome 62/82/4175  . Left shoulder pain 04/11/2015  . Umbilical hernia 30/04/4044  . Microcytosis 04/11/2015    Jerl Mina ,PT, DPT, E-RYT  04/02/2018, 12:36 PM  [place] MAIN [facility] SERVICES [address] [place], [place], [address] Phone: [phone]   Fax:  [phone]  Name: Jeffrey Whitaker MRN: [id number] Date of Birth: 13-Dec-1960

## 2018-04-05 ENCOUNTER — Ambulatory Visit: Payer: 59 | Admitting: Physical Therapy

## 2018-04-05 DIAGNOSIS — R293 Abnormal posture: Secondary | ICD-10-CM

## 2018-04-05 DIAGNOSIS — M6281 Muscle weakness (generalized): Secondary | ICD-10-CM

## 2018-04-05 DIAGNOSIS — R279 Unspecified lack of coordination: Secondary | ICD-10-CM

## 2018-04-05 NOTE — Addendum Note (Signed)
Addended by: Jerl Mina on: 04/05/2018 07:55 PM   Modules accepted: Orders

## 2018-04-05 NOTE — Patient Instructions (Signed)
Feet care :  Self -feet massage   Handshake : fingers between toes, moving ballmounds/toes back and forth several times while other hand anchors at arch. Do the same at the hind/mid foot.  Heel to toes upward to a letter Big Letter T strokes to spread ballmounds and toes, several times, pinch between webs of toes  Run finger tips along top of foot between long bones "comb between the bones"    Wiggle toes and spread them out when relaxing   * to mindify with your limited R knee range, turn 90 deg and sit parallel to the picnic table bench to prop the R ankle there, Use the strap under the arch to pump the ankle 5x and then strap under ballmound to pump the mid and fore foot 5 x   Reach to massage the "T" pattern for the plantar fascia   _______    Transition from standing to floor: Wide squat like you are about to pick something up from the floor --> crawl hand down on thigh and then reach other hand onto the ground (all fours)  To get up, all fours--> lifts hips in to Downward Facing Dog  and walk hands backwards to feet --> mini quat --> hands on thighs, then hips then pause HERE  to avoid (moving too quickly up/ blood rush) -->  knees glide forward and roll  Hips up instead of hinging spine up    ______ Happy Baby pose to stretch pelvic floor   Make sure press down through the shoulders and upper arms before lifting leg and pulling strap ( counter points or "anchor" EXHALE before exertion like lifting leg)    Strap under thighs, but lift on leg at at time to decrease arching of back/ [pushing of the belly to minimize worsening of umbilical hernia [place] side to side 5 breaths   Lengthening of pelvic floor muscles

## 2018-04-05 NOTE — Therapy (Signed)
[place] MAIN [facility] SERVICES [address] [place], [place], [address] Phone: [phone]   Fax:  [phone]  Physical Therapy Treatment / Discharge Summary   Patient Details  Name: Jeffrey Whitaker MRN: [id number] Date of Birth: 13-Jun-1961 Referring Provider: [name] MD   Encounter Date: 04/05/2018  PT End of Session - 04/05/18 2001    Visit Number  3    Number of Visits  5    Date for PT Re-Evaluation  04/29/18    PT Start Time  0815    PT Stop Time  0913    PT Time Calculation (min)  58 min    Equipment Utilized During Treatment  Gait belt    Activity Tolerance  Patient tolerated treatment well;No increased pain    Behavior During Therapy  WFL for tasks assessed/performed       Past Medical History:  Diagnosis Date  . Elevated PSA   . Hypertension     Past Surgical History:  Procedure Laterality Date  . KNEE SURGERY     at age 73     There were no vitals filed for this visit.  Subjective Assessment - 04/05/18 0817    Subjective  Pt practiced his stretches and noticed his clams at 24-25  reps before fatigue.     Pertinent History  Denied LBP, injuries to knees, ankle, feet.  R knee surgery at the age 48-80 year old.     Patient Stated Goals  get back into the routine with exercises after surgery         [facility] PT Assessment - 04/05/18 1956      Observation/Other Assessments   Observations  required review on alignment with previous HEP,  extensor overuse with knees to chest exercise and thus downgraded. Cued for equal weight bearing in front and back leg in lunge stance      Coordination   Gross Motor Movements are Fluid and Coordinated  --   minimal cues for deep core exercise     Other:   Other/ Comments                   [facility] Adult PT Treatment/Exercise - 04/05/18 1944      Therapeutic Activites    Therapeutic Activities  --   see pt instructions     Neuro Re-ed    Neuro Re-ed Details    see pt instructions             PT Education - 04/05/18 1946    Education Details  HEP    Person(s) Educated  Patient    Methods  Explanation;Demonstration;Tactile cues;Verbal cues;Handout    Comprehension  Returned demonstration;Verbalized understanding       PT Short Term Goals - 04/05/18 2001      PT SHORT TERM GOAL #1   Title  Pt wil demo proper body mechanics to minimize straining of abdomen and pelvic floor ( lifting dumbbells, kettlebells, stand<> floor and sit to stand t/f and lifting 40 lb back bag from ground to shoulders)     Time  4    Period  Weeks    Status  Achieved      PT SHORT TERM GOAL #2   Title  Pt will demo proper deep core coordination with proper diaphragmatic excursion in order to optimize postural stability and pelvic floor function    Time  2    Period  Weeks    Status  Achieved  PT SHORT TERM GOAL #3   Title  Pt will demo IND with flexibility HEP w/ modificaitons to R knee limited ROM to utilize during 77 miles hiking trip in order to maintain proper pelvic floor ROM and minimize overactivity of lower kinetic chain      Time  4    Period  Weeks    Status  Achieved               Plan - 04/05/18 1956    Clinical Impression Statement  Pt has achieved 100% of his goals. Pt demo'd proper deep core coordination, proper body mechanics with functional activities, IND with his flexibility HEP. Pt was not educated on pelvic floor strengthening exercsies due to overactivity of pelvic floor. Focused on hip abduction strengthening,  lengthening of pelvic floor, and maintaining lower kinetic chain flexibility while on his 77 mile hiking trip. Pt required modifications to exercises to account for limited R knee ROM.  Pt demo'd proper lifitng mechanics with 40lb hiking backpack with less risk for injuries. Pt demo'd proper transfers from stand to floor and sit to stand. Pt demo'd proper deep core strengthening exercises. Pt is being d/c at this and  will benefit from pelvic PT post-surgery to minimize urinary incontinence and to learn proper and safe fitness exercises in order to return to PLOF after surgery.     Rehab Potential  Good    PT Frequency  1x / week    PT Duration  12 weeks    PT Treatment/Interventions  Balance training;Therapeutic exercise;Neuromuscular re-education;Stair training;Gait training;Moist Heat;Patient/family education;Therapeutic activities;Functional mobility training;Manual techniques;Biofeedback;Dry needling;Scar mobilization;Taping    Consulted and Agree with Plan of Care  Patient       Patient will benefit from skilled therapeutic intervention in order to improve the following deficits and impairments:  Improper body mechanics, Increased muscle spasms, Postural dysfunction, Decreased coordination, Decreased mobility, Decreased range of motion, Decreased endurance, Difficulty walking, Decreased strength, Abnormal gait  Visit Diagnosis: Muscle weakness (generalized)  Abnormal posture  Unspecified lack of coordination     Problem List Patient Active Problem List   Diagnosis Date Noted  . Allergic rhinitis, seasonal 04/15/2015  . Abnormal blood sugar 04/11/2015  . Benign hypertension 04/11/2015  . Dyslipidemia 04/11/2015  . Obesity (BMI 30.0-34.9) 04/11/2015  . Right medial knee pain 04/11/2015  . Metabolic syndrome 67/20/9470  . Left shoulder pain 04/11/2015  . Umbilical hernia 96/28/3662  . Microcytosis 04/11/2015    Jerl Mina ,PT, DPT, E-RYT  04/05/2018, 8:01 PM  [place] MAIN [facility] SERVICES [address] [place], [place], [address] Phone: [phone]   Fax:  [phone]  Name: Jeffrey Whitaker MRN: [id number] Date of Birth: 1961-01-08

## 2018-04-06 ENCOUNTER — Other Ambulatory Visit: Payer: Self-pay | Admitting: Family Medicine

## 2018-04-06 DIAGNOSIS — I10 Essential (primary) hypertension: Secondary | ICD-10-CM

## 2018-04-13 ENCOUNTER — Encounter: Payer: 59 | Admitting: Physical Therapy

## 2018-04-19 ENCOUNTER — Encounter
Admission: RE | Admit: 2018-04-19 | Discharge: 2018-04-19 | Disposition: A | Discharge status: Home / Self Care | Payer: 59 | Source: Ambulatory Visit | Attending: Urology | Admitting: Urology

## 2018-04-19 ENCOUNTER — Other Ambulatory Visit: Payer: Self-pay

## 2018-04-19 DIAGNOSIS — Z01818 Encounter for other preprocedural examination: Secondary | ICD-10-CM | POA: Insufficient documentation

## 2018-04-19 DIAGNOSIS — I10 Essential (primary) hypertension: Secondary | ICD-10-CM | POA: Diagnosis not present

## 2018-04-19 DIAGNOSIS — C678 Malignant neoplasm of overlapping sites of bladder: Secondary | ICD-10-CM

## 2018-04-19 HISTORY — DX: Umbilical hernia without obstruction or gangrene: K42.9

## 2018-04-19 HISTORY — DX: Malignant neoplasm of prostate: C61

## 2018-04-19 LAB — BASIC METABOLIC PANEL
ANION GAP: 7 (ref 5–15)
BUN: 27 mg/dL — ABNORMAL HIGH (ref 6–20)
CALCIUM: 9.7 mg/dL (ref 8.9–10.3)
CO2: 30 mmol/L (ref 22–32)
Chloride: 104 mmol/L (ref 98–111)
Creatinine, Ser: 1.18 mg/dL (ref 0.61–1.24)
Glucose, Bld: 102 mg/dL — ABNORMAL HIGH (ref 70–99)
POTASSIUM: 4 mmol/L (ref 3.5–5.1)
SODIUM: 141 mmol/L (ref 135–145)

## 2018-04-19 LAB — TYPE AND SCREEN
ABO/RH(D): O POS
Antibody Screen: NEGATIVE

## 2018-04-19 LAB — URINALYSIS, ROUTINE W REFLEX MICROSCOPIC
Bilirubin Urine: NEGATIVE
GLUCOSE, UA: NEGATIVE mg/dL
HGB URINE DIPSTICK: NEGATIVE
Ketones, ur: NEGATIVE mg/dL
LEUKOCYTES UA: NEGATIVE
Nitrite: NEGATIVE
PH: 5 (ref 5.0–8.0)
Protein, ur: NEGATIVE mg/dL
Specific Gravity, Urine: 1.017 (ref 1.005–1.030)

## 2018-04-19 LAB — CBC
HEMATOCRIT: 38.9 % — AB (ref 40.0–52.0)
Hemoglobin: 13 g/dL (ref 13.0–18.0)
MCH: 25 pg — ABNORMAL LOW (ref 26.0–34.0)
MCHC: 33.3 g/dL (ref 32.0–36.0)
MCV: 75.1 fL — ABNORMAL LOW (ref 80.0–100.0)
Platelets: 180 10*3/uL (ref 150–440)
RBC: 5.17 MIL/uL (ref 4.40–5.90)
RDW: 14.5 % (ref 11.5–14.5)
WBC: 7.4 10*3/uL (ref 3.8–10.6)

## 2018-04-19 LAB — PROTIME-INR
INR: 0.89
PROTHROMBIN TIME: 12 s (ref 11.4–15.2)

## 2018-04-19 NOTE — Patient Instructions (Signed)
Your procedure is scheduled on: Monday, April 26, 2018 Report to Day Surgery on the 2nd floor of the [facility]. To find out your arrival time, please call [phone] between 1PM - 3PM on: Friday, April 23, 2018  REMEMBER: Instructions that are not followed completely may result in serious medical risk, up to and including death; or upon the discretion of your surgeon and anesthesiologist your surgery may need to be rescheduled.  Do not eat food after midnight the night before surgery.  No gum chewing, lozengers or hard candies.  You may however, drink CLEAR liquids up to 2 hours before you are scheduled to arrive for your surgery. Do not drink anything within 2 hours of the start of your surgery.  Clear liquids include: - water  - apple juice without pulp - gatorade - black coffee or tea (Do NOT add milk or creamers to the coffee or tea) Do NOT drink anything that is not on this list.  No Alcohol for 24 hours before or after surgery.  No Smoking including e-cigarettes for 24 hours prior to surgery.  No chewable tobacco products for at least 6 hours prior to surgery.  No nicotine patches on the day of surgery.  On the morning of surgery brush your teeth with toothpaste and water, you may rinse your mouth with mouthwash if you wish. Do not swallow any toothpaste or mouthwash.  Notify your doctor if there is any change in your medical condition (cold, fever, infection).  Do not wear jewelry, make-up, hairpins, clips or nail polish.  Do not wear lotions, powders, or perfumes. You may wear deodorant.  Do not shave 48 hours prior to surgery. Men may shave face and neck.  Contacts and dentures may not be worn into surgery.  Do not bring valuables to the hospital, including drivers license, insurance or credit cards.  [facility] is not responsible for any belongings or valuables.   TAKE THESE MEDICATIONS THE MORNING OF SURGERY:  none  Use CHG Soap as directed on  instruction sheet.  NOW!  Stop ASPRIN and Anti-inflammatories (NSAIDS) such as Advil, Aleve, Ibuprofen, Motrin, Naproxen, Naprosyn and Aspirin based products such as Excedrin, Goodys Powder, BC Powder. (May take Tylenol or Acetaminophen if needed.)  Now!  Stop ANY OVER THE COUNTER supplements until after surgery.  Wear comfortable clothing (specific to your surgery type) to the hospital.  Plan for stool softeners for home use.  If you are being admitted to the hospital overnight, leave your suitcase in the car. After surgery it may be brought to your room.  If you are being discharged the day of surgery, you will not be allowed to drive home. You will need a responsible adult to drive you home and stay with you that night.   If you are taking public transportation, you will need to have a responsible adult with you. Please confirm with your physician that it is acceptable to use public transportation.   Please call [phone] if you have any questions about these instructions.

## 2018-04-20 LAB — URINE CULTURE

## 2018-04-25 MED ORDER — CEFAZOLIN SODIUM-DEXTROSE 2-4 GM/100ML-% IV SOLN
2.0000 g | INTRAVENOUS | Status: AC
  Administered 2018-04-26: 2 g via INTRAVENOUS

## 2018-04-26 ENCOUNTER — Ambulatory Visit: Payer: 59 | Admitting: Anesthesiology

## 2018-04-26 ENCOUNTER — Encounter
Admission: RE | Disposition: A | Discharge status: Home / Self Care | Payer: Self-pay | Source: Ambulatory Visit | Attending: Urology

## 2018-04-26 ENCOUNTER — Observation Stay
Admission: RE | Admit: 2018-04-26 | Discharge: 2018-04-27 | Disposition: A | Discharge status: Home / Self Care | Payer: 59 | Source: Ambulatory Visit | Attending: Urology | Admitting: Urology

## 2018-04-26 ENCOUNTER — Other Ambulatory Visit: Payer: Self-pay

## 2018-04-26 ENCOUNTER — Encounter: Payer: Self-pay | Admitting: *Deleted

## 2018-04-26 DIAGNOSIS — I10 Essential (primary) hypertension: Secondary | ICD-10-CM | POA: Insufficient documentation

## 2018-04-26 DIAGNOSIS — Z7982 Long term (current) use of aspirin: Secondary | ICD-10-CM | POA: Diagnosis not present

## 2018-04-26 DIAGNOSIS — Z23 Encounter for immunization: Secondary | ICD-10-CM | POA: Diagnosis not present

## 2018-04-26 DIAGNOSIS — K429 Umbilical hernia without obstruction or gangrene: Secondary | ICD-10-CM | POA: Diagnosis not present

## 2018-04-26 DIAGNOSIS — Z87891 Personal history of nicotine dependence: Secondary | ICD-10-CM | POA: Insufficient documentation

## 2018-04-26 DIAGNOSIS — Z79899 Other long term (current) drug therapy: Secondary | ICD-10-CM | POA: Diagnosis not present

## 2018-04-26 DIAGNOSIS — C61 Malignant neoplasm of prostate: Principal | ICD-10-CM | POA: Diagnosis present

## 2018-04-26 HISTORY — PX: PELVIC LYMPH NODE DISSECTION: SHX6543

## 2018-04-26 HISTORY — PX: ROBOT ASSISTED LAPAROSCOPIC RADICAL PROSTATECTOMY: SHX5141

## 2018-04-26 LAB — ABO/RH: ABO/RH(D): O POS

## 2018-04-26 SURGERY — ROBOTIC ASSISTED LAPAROSCOPIC RADICAL PROSTATECTOMY
Anesthesia: General

## 2018-04-26 MED ORDER — THROMBIN 5000 UNITS EX SOLR
CUTANEOUS | Status: DC | PRN
  Administered 2018-04-26: 5000 [IU] via TOPICAL

## 2018-04-26 MED ORDER — FENTANYL CITRATE (PF) 100 MCG/2ML IJ SOLN
INTRAMUSCULAR | Status: AC
  Administered 2018-04-26: 25 ug via INTRAVENOUS
  Filled 2018-04-26: qty 2

## 2018-04-26 MED ORDER — CEFAZOLIN SODIUM-DEXTROSE 2-4 GM/100ML-% IV SOLN
INTRAVENOUS | Status: AC
  Filled 2018-04-26: qty 100

## 2018-04-26 MED ORDER — SUCCINYLCHOLINE CHLORIDE 20 MG/ML IJ SOLN
INTRAMUSCULAR | Status: DC | PRN
  Administered 2018-04-26: 120 mg via INTRAVENOUS

## 2018-04-26 MED ORDER — ONDANSETRON HCL 4 MG/2ML IJ SOLN
INTRAMUSCULAR | Status: AC
  Filled 2018-04-26: qty 2

## 2018-04-26 MED ORDER — SUGAMMADEX SODIUM 500 MG/5ML IV SOLN
INTRAVENOUS | Status: DC | PRN
  Administered 2018-04-26: 210.4 mg via INTRAVENOUS

## 2018-04-26 MED ORDER — OXYBUTYNIN CHLORIDE 5 MG PO TABS
5.0000 mg | ORAL_TABLET | Freq: Three times a day (TID) | ORAL | Status: DC | PRN
  Administered 2018-04-26: 5 mg via ORAL
  Filled 2018-04-26: qty 1

## 2018-04-26 MED ORDER — SUCCINYLCHOLINE CHLORIDE 20 MG/ML IJ SOLN
INTRAMUSCULAR | Status: AC
  Filled 2018-04-26: qty 1

## 2018-04-26 MED ORDER — THROMBIN 5000 UNITS EX SOLR
CUTANEOUS | Status: AC
  Filled 2018-04-26: qty 5000

## 2018-04-26 MED ORDER — PROPOFOL 10 MG/ML IV BOLUS
INTRAVENOUS | Status: AC
  Filled 2018-04-26: qty 20

## 2018-04-26 MED ORDER — DEXAMETHASONE SODIUM PHOSPHATE 10 MG/ML IJ SOLN
INTRAMUSCULAR | Status: DC | PRN
  Administered 2018-04-26: 10 mg via INTRAVENOUS

## 2018-04-26 MED ORDER — SODIUM CHLORIDE 0.9 % IV SOLN
INTRAVENOUS | Status: DC
  Administered 2018-04-26: 22:00:00 via INTRAVENOUS

## 2018-04-26 MED ORDER — MORPHINE SULFATE (PF) 2 MG/ML IV SOLN: 2.0000 mg | INTRAVENOUS | Status: DC | PRN

## 2018-04-26 MED ORDER — ACETAMINOPHEN 325 MG PO TABS: 650.0000 mg | ORAL_TABLET | ORAL | Status: DC | PRN

## 2018-04-26 MED ORDER — BUPIVACAINE HCL (PF) 0.5 % IJ SOLN
INTRAMUSCULAR | Status: AC
  Filled 2018-04-26: qty 30

## 2018-04-26 MED ORDER — LISINOPRIL 20 MG PO TABS
20.0000 mg | ORAL_TABLET | Freq: Every day | ORAL | Status: DC
  Administered 2018-04-27: 20 mg via ORAL
  Filled 2018-04-26: qty 1

## 2018-04-26 MED ORDER — BELLADONNA ALKALOIDS-OPIUM 16.2-60 MG RE SUPP
1.0000 | Freq: Four times a day (QID) | RECTAL | Status: DC | PRN
  Filled 2018-04-26: qty 1

## 2018-04-26 MED ORDER — HEPARIN SODIUM (PORCINE) 5000 UNIT/ML IJ SOLN
5000.0000 [IU] | Freq: Three times a day (TID) | INTRAMUSCULAR | Status: DC
  Administered 2018-04-27: 5000 [IU] via SUBCUTANEOUS
  Filled 2018-04-26: qty 1

## 2018-04-26 MED ORDER — GLYCOPYRROLATE 0.2 MG/ML IJ SOLN
INTRAMUSCULAR | Status: AC
  Filled 2018-04-26: qty 1

## 2018-04-26 MED ORDER — ONDANSETRON HCL 4 MG/2ML IJ SOLN: 4.0000 mg | INTRAMUSCULAR | Status: DC | PRN

## 2018-04-26 MED ORDER — ONDANSETRON HCL 4 MG/2ML IJ SOLN
INTRAMUSCULAR | Status: DC | PRN
  Administered 2018-04-26: 4 mg via INTRAVENOUS

## 2018-04-26 MED ORDER — FAMOTIDINE 20 MG PO TABS
20.0000 mg | ORAL_TABLET | Freq: Once | ORAL | Status: AC
  Administered 2018-04-26: 20 mg via ORAL

## 2018-04-26 MED ORDER — SUGAMMADEX SODIUM 500 MG/5ML IV SOLN
INTRAVENOUS | Status: AC
  Filled 2018-04-26: qty 5

## 2018-04-26 MED ORDER — ACETAMINOPHEN 10 MG/ML IV SOLN
INTRAVENOUS | Status: DC | PRN
  Administered 2018-04-26: 1000 mg via INTRAVENOUS

## 2018-04-26 MED ORDER — LIDOCAINE HCL (CARDIAC) PF 100 MG/5ML IV SOSY
PREFILLED_SYRINGE | INTRAVENOUS | Status: DC | PRN
  Administered 2018-04-26: 100 mg via INTRAVENOUS

## 2018-04-26 MED ORDER — DIPHENHYDRAMINE HCL 50 MG/ML IJ SOLN: 12.5000 mg | Freq: Four times a day (QID) | INTRAMUSCULAR | Status: DC | PRN

## 2018-04-26 MED ORDER — FENTANYL CITRATE (PF) 100 MCG/2ML IJ SOLN
25.0000 ug | INTRAMUSCULAR | Status: DC | PRN
  Administered 2018-04-26 (×5): 25 ug via INTRAVENOUS

## 2018-04-26 MED ORDER — SEVOFLURANE IN SOLN
RESPIRATORY_TRACT | Status: AC
  Filled 2018-04-26: qty 250

## 2018-04-26 MED ORDER — LIDOCAINE HCL (PF) 2 % IJ SOLN
INTRAMUSCULAR | Status: AC
  Filled 2018-04-26: qty 10

## 2018-04-26 MED ORDER — PROPOFOL 10 MG/ML IV BOLUS
INTRAVENOUS | Status: DC | PRN
  Administered 2018-04-26: 200 mg via INTRAVENOUS

## 2018-04-26 MED ORDER — ACETAMINOPHEN 10 MG/ML IV SOLN
INTRAVENOUS | Status: AC
  Filled 2018-04-26: qty 100

## 2018-04-26 MED ORDER — OXYCODONE-ACETAMINOPHEN 5-325 MG PO TABS
1.0000 | ORAL_TABLET | ORAL | Status: DC | PRN
  Administered 2018-04-27: 1 via ORAL
  Filled 2018-04-26: qty 2

## 2018-04-26 MED ORDER — DOCUSATE SODIUM 100 MG PO CAPS
100.0000 mg | ORAL_CAPSULE | Freq: Two times a day (BID) | ORAL | Status: DC
  Administered 2018-04-27: 100 mg via ORAL
  Filled 2018-04-26: qty 1

## 2018-04-26 MED ORDER — ONDANSETRON HCL 4 MG/2ML IJ SOLN: 4.0000 mg | Freq: Once | INTRAMUSCULAR | Status: DC | PRN

## 2018-04-26 MED ORDER — QUINAPRIL-HYDROCHLOROTHIAZIDE 20-12.5 MG PO TABS: 1.0000 | ORAL_TABLET | Freq: Every day | ORAL | Status: DC

## 2018-04-26 MED ORDER — EPHEDRINE SULFATE 50 MG/ML IJ SOLN
INTRAMUSCULAR | Status: AC
  Filled 2018-04-26: qty 1

## 2018-04-26 MED ORDER — MIDAZOLAM HCL 2 MG/2ML IJ SOLN
INTRAMUSCULAR | Status: DC | PRN
  Administered 2018-04-26: 2 mg via INTRAVENOUS

## 2018-04-26 MED ORDER — CEFAZOLIN SODIUM-DEXTROSE 1-4 GM/50ML-% IV SOLN
1.0000 g | Freq: Three times a day (TID) | INTRAVENOUS | Status: AC
  Administered 2018-04-26 – 2018-04-27 (×2): 1 g via INTRAVENOUS
  Filled 2018-04-26 (×2): qty 50

## 2018-04-26 MED ORDER — DIPHENHYDRAMINE HCL 12.5 MG/5ML PO ELIX
12.5000 mg | ORAL_SOLUTION | Freq: Four times a day (QID) | ORAL | Status: DC | PRN
  Filled 2018-04-26: qty 5

## 2018-04-26 MED ORDER — LACTATED RINGERS IV SOLN
INTRAVENOUS | Status: DC | PRN
  Administered 2018-04-26: 14:00:00 via INTRAVENOUS

## 2018-04-26 MED ORDER — LACTATED RINGERS IV SOLN
INTRAVENOUS | Status: DC
  Administered 2018-04-26 (×2): via INTRAVENOUS

## 2018-04-26 MED ORDER — PRAVASTATIN SODIUM 20 MG PO TABS
40.0000 mg | ORAL_TABLET | Freq: Every day | ORAL | Status: DC
  Administered 2018-04-27: 40 mg via ORAL
  Filled 2018-04-26 (×2): qty 2

## 2018-04-26 MED ORDER — FAMOTIDINE 20 MG PO TABS
ORAL_TABLET | ORAL | Status: AC
  Administered 2018-04-26: 20 mg via ORAL
  Filled 2018-04-26: qty 1

## 2018-04-26 MED ORDER — DEXAMETHASONE SODIUM PHOSPHATE 10 MG/ML IJ SOLN
INTRAMUSCULAR | Status: AC
  Filled 2018-04-26: qty 1

## 2018-04-26 MED ORDER — GLYCOPYRROLATE 0.2 MG/ML IJ SOLN
INTRAMUSCULAR | Status: DC | PRN
  Administered 2018-04-26: 0.2 mg via INTRAVENOUS

## 2018-04-26 MED ORDER — ROCURONIUM BROMIDE 100 MG/10ML IV SOLN
INTRAVENOUS | Status: DC | PRN
  Administered 2018-04-26: 10 mg via INTRAVENOUS
  Administered 2018-04-26: 40 mg via INTRAVENOUS
  Administered 2018-04-26: 20 mg via INTRAVENOUS
  Administered 2018-04-26: 30 mg via INTRAVENOUS

## 2018-04-26 MED ORDER — MIDAZOLAM HCL 2 MG/2ML IJ SOLN
INTRAMUSCULAR | Status: AC
  Filled 2018-04-26: qty 2

## 2018-04-26 MED ORDER — BUPIVACAINE HCL 0.5 % IJ SOLN
INTRAMUSCULAR | Status: DC | PRN
  Administered 2018-04-26: 20 mL

## 2018-04-26 MED ORDER — EPHEDRINE SULFATE 50 MG/ML IJ SOLN
INTRAMUSCULAR | Status: DC | PRN
  Administered 2018-04-26: 10 mg via INTRAVENOUS

## 2018-04-26 MED ORDER — FENTANYL CITRATE (PF) 250 MCG/5ML IJ SOLN
INTRAMUSCULAR | Status: AC
  Filled 2018-04-26: qty 5

## 2018-04-26 MED ORDER — FENTANYL CITRATE (PF) 100 MCG/2ML IJ SOLN
INTRAMUSCULAR | Status: DC | PRN
  Administered 2018-04-26 (×5): 50 ug via INTRAVENOUS

## 2018-04-26 MED ORDER — ROCURONIUM BROMIDE 50 MG/5ML IV SOLN
INTRAVENOUS | Status: AC
  Filled 2018-04-26: qty 2

## 2018-04-26 MED ORDER — HYDROCHLOROTHIAZIDE 12.5 MG PO CAPS
12.5000 mg | ORAL_CAPSULE | Freq: Every day | ORAL | Status: DC
  Administered 2018-04-27: 12.5 mg via ORAL
  Filled 2018-04-26: qty 1

## 2018-04-26 SURGICAL SUPPLY — 94 items
ANCHOR TIS RET SYS 235ML (MISCELLANEOUS) ×3 IMPLANT
APPLICATOR SURGIFLO ENDO (HEMOSTASIS) ×6 IMPLANT
APPLIER CLIP LOGIC TI 5 (MISCELLANEOUS) IMPLANT
BAG URINE DRAINAGE (UROLOGICAL SUPPLIES) ×3 IMPLANT
BLADE CLIPPER SURG (BLADE) IMPLANT
BULB RESERV EVAC DRAIN JP 100C (MISCELLANEOUS) IMPLANT
CANISTER SUCT 1200ML W/VALVE (MISCELLANEOUS) ×3 IMPLANT
CATH DRAINAGE MALECOT 26FR (CATHETERS) ×2 IMPLANT
CATH FOL 2WAY LX 18X5 (CATHETERS) ×6 IMPLANT
CATH MALECOT (CATHETERS) ×3
CHLORAPREP W/TINT 26ML (MISCELLANEOUS) ×6 IMPLANT
CLIP SUT LAPRA TY ABSORB (SUTURE) IMPLANT
CLIP VESOLOCK LG 6/CT PURPLE (CLIP) ×9 IMPLANT
CORD BIP STRL DISP 12FT (MISCELLANEOUS) ×3 IMPLANT
CORD MONOPOLAR M/FML 12FT (MISCELLANEOUS) ×3 IMPLANT
COVER TIP SHEARS 8 DVNC (MISCELLANEOUS) ×2 IMPLANT
COVER TIP SHEARS 8MM DA VINCI (MISCELLANEOUS) ×1
DEFOGGER SCOPE WARMER CLEARIFY (MISCELLANEOUS) ×3 IMPLANT
DERMABOND ADVANCED (GAUZE/BANDAGES/DRESSINGS) ×1
DERMABOND ADVANCED .7 DNX12 (GAUZE/BANDAGES/DRESSINGS) ×2 IMPLANT
DRAIN CHANNEL JP 15F RND 16 (MISCELLANEOUS) IMPLANT
DRAIN CHANNEL JP 19F (MISCELLANEOUS) IMPLANT
DRAPE LEGGINS SURG 28X43 STRL (DRAPES) ×3 IMPLANT
DRAPE SHEET LG 3/4 BI-LAMINATE (DRAPES) ×3 IMPLANT
DRAPE SURG 17X11 SM STRL (DRAPES) ×12 IMPLANT
DRAPE UNDER BUTTOCK W/FLU (DRAPES) ×3 IMPLANT
DRIVER LRG NEEDLE DA VINCI (INSTRUMENTS) ×2
DRIVER NDLE LRG DVNC (INSTRUMENTS) ×4 IMPLANT
DRSG TELFA 3X8 NADH (GAUZE/BANDAGES/DRESSINGS) ×3 IMPLANT
ELECT REM PT RETURN 9FT ADLT (ELECTROSURGICAL) ×3
ELECTRODE REM PT RTRN 9FT ADLT (ELECTROSURGICAL) ×2 IMPLANT
FORCEPS MARYLAND BIPOLAR 8X55 (INSTRUMENTS) ×1
FORCEPS MRYLND BPLR 8X55 DVNC (INSTRUMENTS) ×2 IMPLANT
GLOVE BIO SURGEON STRL SZ 6.5 (GLOVE) ×9 IMPLANT
GOWN STRL REUS W/ TWL LRG LVL3 (GOWN DISPOSABLE) ×6 IMPLANT
GOWN STRL REUS W/TWL LRG LVL3 (GOWN DISPOSABLE) ×3
GRASPER SUT TROCAR 14GX15 (MISCELLANEOUS) ×3 IMPLANT
HEMOSTAT SURGICEL 2X14 (HEMOSTASIS) IMPLANT
HOLDER FOLEY CATH W/STRAP (MISCELLANEOUS) ×3 IMPLANT
IRRIGATION STRYKERFLOW (MISCELLANEOUS) ×2 IMPLANT
IRRIGATOR STRYKERFLOW (MISCELLANEOUS) ×3
IV LACTATED RINGERS 1000ML (IV SOLUTION) ×3 IMPLANT
KIT ACCESSORY DA VINCI DISP (KITS) ×1
KIT ACCESSORY DVNC DISP (KITS) ×2 IMPLANT
KIT PINK PAD W/HEAD ARE REST (MISCELLANEOUS) ×3
KIT PINK PAD W/HEAD ARM REST (MISCELLANEOUS) ×2 IMPLANT
LABEL OR SOLS (LABEL) ×3 IMPLANT
MARKER SKIN DUAL TIP RULER LAB (MISCELLANEOUS) ×3 IMPLANT
NEEDLE HYPO 22GX1.5 SAFETY (NEEDLE) ×3 IMPLANT
NEEDLE INSUFFLATION 14GA 120MM (NEEDLE) ×3 IMPLANT
NS IRRIG 500ML POUR BTL (IV SOLUTION) ×3 IMPLANT
PACK LAP CHOLECYSTECTOMY (MISCELLANEOUS) ×3 IMPLANT
PENCIL ELECTRO HAND CTR (MISCELLANEOUS) ×3 IMPLANT
PROGRASP ENDOWRIST DA VINCI (INSTRUMENTS) ×1
PROGRASP ENDOWRIST DVNC (INSTRUMENTS) ×2 IMPLANT
RELOAD STAPLER WHITE 60MM (STAPLE) IMPLANT
SLEEVE ENDOPATH XCEL 5M (ENDOMECHANICALS) ×6 IMPLANT
SOLUTION ELECTROLUBE (MISCELLANEOUS) ×3 IMPLANT
SPOGE SURGIFLO 8M (HEMOSTASIS) ×1
SPONGE LAP 4X18 RFD (DISPOSABLE) ×3 IMPLANT
SPONGE SURGIFLO 8M (HEMOSTASIS) ×2 IMPLANT
SPONGE VERSALON 4X4 4PLY (MISCELLANEOUS) IMPLANT
STAPLE ECHEON FLEX 60 POW ENDO (STAPLE) IMPLANT
STAPLER RELOAD WHITE 60MM (STAPLE)
STAPLER SKIN PROX 35W (STAPLE) ×3 IMPLANT
STRAP SAFETY 5IN WIDE (MISCELLANEOUS) ×3 IMPLANT
SURGILUBE 2OZ TUBE FLIPTOP (MISCELLANEOUS) ×6 IMPLANT
SUT DVC VLOC 3-0 CL 6 P-12 (SUTURE) ×6 IMPLANT
SUT DVC VLOC 90 3-0 CV23 UNDY (SUTURE) ×3 IMPLANT
SUT DVC VLOC 90 3-0 CV23 VLT (SUTURE) ×3
SUT ETHILON 3-0 FS-10 30 BLK (SUTURE)
SUT MNCRL 4-0 (SUTURE) ×2
SUT MNCRL 4-0 27XMFL (SUTURE) ×4
SUT PROLENE 5 0 RB 1 DA (SUTURE) IMPLANT
SUT SILK 2 0 SH (SUTURE) ×3 IMPLANT
SUT VIC AB 0 CT1 36 (SUTURE) ×6 IMPLANT
SUT VIC AB 2-0 CT1 (SUTURE) ×6 IMPLANT
SUT VIC AB 2-0 SH 27 (SUTURE) ×3
SUT VIC AB 2-0 SH 27XBRD (SUTURE) ×6 IMPLANT
SUT VICRYL 0 AB UR-6 (SUTURE) ×9 IMPLANT
SUTURE DVC VLC 90 3-0 CV23 VLT (SUTURE) ×2 IMPLANT
SUTURE EHLN 3-0 FS-10 30 BLK (SUTURE) IMPLANT
SUTURE MNCRL 4-0 27XMF (SUTURE) ×4 IMPLANT
SYR 10ML LL (SYRINGE) ×3 IMPLANT
SYR BULB IRRIG 60ML STRL (SYRINGE) ×3 IMPLANT
SYRINGE IRR TOOMEY STRL 70CC (SYRINGE) ×6 IMPLANT
TAPE CLOTH 10X20 WHT NS LF (TAPE) ×4 IMPLANT
TAPE CLOTH 2X10 WHT NS LF (TAPE) ×2
TROCAR DISP BLADELESS 8 DVNC (TROCAR) ×2 IMPLANT
TROCAR DISP BLADELESS 8MM (TROCAR) ×1
TROCAR ENDOPATH XCEL 12X100 BL (ENDOMECHANICALS) ×6 IMPLANT
TROCAR XCEL 12X100 BLDLESS (ENDOMECHANICALS) ×3 IMPLANT
TROCAR XCEL NON-BLD 5MMX100MML (ENDOMECHANICALS) ×3 IMPLANT
TUBING INSUFFLATION (TUBING) ×3 IMPLANT

## 2018-04-26 NOTE — Anesthesia Post-op Follow-up Note (Signed)
Anesthesia QCDR form completed.        

## 2018-04-26 NOTE — Op Note (Signed)
04/26/18  PREOPERATIVE DIAGNOSIS: Prostate cancer.  POSTOPERATIVE DIAGNOSIS: Prostate cancer.  OPERATION PERFORMED: 1. DaVinci laparoscopic radical prostatectomy (bilateral partial nerve sparing) 2 DaVinci laproscopic bilateral pelvic lymph node dissection.  SURGEON: [name], MD  ASSISTANTS: Nickolas Madrid, MD  ANESTHESIA: General.  EBL: 350 cc  SPECIMEN: Prostate with bilateral seminal vesicals, bilateral pelvic lymph nodes, anterior fat pad, posterior bladder neck margin  FINDINGS: Accessory Pudendal Vessel: none  INDICATION: Pt.is a 57 year old male with Gleason 3+4 prostate cancer. Treatment options were discussed with him at length and he chose DaVinci radical prostatectomy.  Excellent baseline erections therefore plan for at least partial nerve sparing was discussed. Bilateral pelvic lymph node dissection was planned due to his risk stratification.  PROCEDURE IN DETAIL: Patient was given Ancef preoperatively. He had sequential compression devices applied preoperatively for DVT prophylaxis. He was taken to the operating room where he was induced with general anesthesia. After adequate anesthesia, he was placed in the dorsal lithotomy position. His arms were draped by his side and was appropriately padded and secured to the operating room table. He was placed in the Trendelenburg position.  He was prepped and draped in sterile fashion. An 53 French Foley was placed in the bladder and instilled with 15 cc sterile water. Orogastric tube was placed. The Veress needle was passed just above the umbilicus and the abdomen was insufflated to 15 atmospheres. A 12 mm, blunt-tip trocar was placed just above the umbilicus. The zero-degree camera was passed within this and the following trocars were placed under direct vision; 8 mm robotic trocars were placed 9 cm laterally and inferiorly to the initially placed umbilical trocar. A third one was placed 7 cm lateral  to the left-sided trocar. In the corresponding position on the right side, a 12 mm trocar was placed, and then a 5 mm trocar was placed to the right and well above the umbilicus.  The robot was then docked with the robot trocar. I used the zero-degree camera. I had the hot scissors in the right hand and the left hand with the [place] bipolar and far left hand the Prograsp forceps. Initially I divided the median umbilical ligament bilaterally and the urachus and developed the space of Retzius down to pubic bone. I divided the parietal peritoneum laterally up to the vas deferens on each side. I used the Cardier forceps to provide cranial traction on the urachus. I cleaned off the Endopelvic fascia on each side and then divided it with the scissors laterally to the perirectal fat and medially to the puboprostatic ligaments which were divided. I then ligated the dorsal vein complex 2-0 Vicryl suture on a CT1.  Later on the case, there is some oozing in the second suture was thrown at the same location for further hemostasis.  I then addressed the bladder neck with a 30-degree down lens. I identified the bladder neck by pulling on the Foley catheter. I divided the anterior bladder neck musculature until I then found the anterior bladder neck mucosa which was incised. I identified the Foley catheter within, deflated the balloon, pulled the Foley out through this opening and then using the Carter-Thomason needle with a #0-Vicryl suture, passed through The suprapubic region and pulled the suture through the eye of the Foley and then back out. This allowed me to provide upward traction on the prostate. I then divided the lateral bladder neck mucosa and the posterior bladder neck mucosa. I was well away from ureteral orifices. I divided the posterior bladder neck  musculature until I identified the vas deferens. They were freed proximally, then divided. I freed up the seminal vesicals using  blunt and sharp dissection. Extremely judicious use of electrocautery was used near the seminal vesicle tips to avoid injury to neurovascular bundle.   I then went back to the 0-degree lens. I divided the Denonvilliers fascia beneath the prostate and developed the prostate off the rectum. I then did a bilateral partial nerve sparing by  creating a plane which was intrafascial. I then isolated the pedicles of the prostate and placed weck clips on the pedicles of the prostate and then divided it with cold scissors. I continued to divide the neurovascular bundles off the prostate out to the apex of the prostate. At this point the prostate was freed up except for the urethra. I addressed the prostate anteriorly, divided the dorsal vein , then the anterior urethral wall, pulled the Foley catheter back and then divided the posterior urethral wall. Specimen was completely freed up. I placed the prostate in an Endo catch bag and then placed the bag in the upper abdomen out of the way. I then irrigated the pelvis. The rectal test was negative. There was reasonable hemostasis.  I then did the pelvic lymph node dissection by incising the fascia overlying the right external iliac vein, dissecting distally. I went just distal to the node of [place] where we placed clips and then divided the lymphatics. The lateral aspect of the dissection was the pelvic side wall, inferior was the obturator nerve and proximal the hypogastric vessels. I placed clips at the proximal aspect and then divided the lymphatics. This was removed with the spoon grasper and sent to pathology.   I then did the left obturator lymph node dissection in the same fashion as the left side.  Surgicel was placed in either bed of the lymph node dissection for additional hemostasis.  With good hemostasis, I then did the posterior reconstruction. I used a 3-0 VLoc suture on an RB1 through the cut edge of Denonvilliers fascia to the   posterior striated sphincter underneath the urethra in a running fashion. This brought the bladder neck and urethra and closer proximity to help facilitate anastomosis.   There is some suspicious appearing tissue, possible small median lobe component in the posterior bladder neck which was excised using cold scissors and sent off as a posterior bladder neck margin.  I then did the urethral vesicle anastomosis again with two 3-0 VLoc sutures on an RB1 needle interlocked. I passed both ends of the suture from the outside-in through the bladder neck at the 6 o'clock position. I passed both through the urethral stump from the inside-out in the corresponding position. I reapproximated the bladder neck to the urethra. I then ran the Left suture on the left side anastomosis to the 9 o'clock position. Then I went back to the right sided suture and ran that up the right side to the 12 o'clock position. I then continued the left suture to the 12 o'clock position.The suture was then suspended anteriorly behind the pubic bone.   I then placed a new 18 French Foley into the bladder and filled it with 12 cc sterile water. I irrigated the bladder with 160 cc. There was no leakage. There was reasonable hemostasis.  Surgiflo was used on either side of the pedicles for an additional hemostasis.  The instruments were then removed. The robot was undocked and all the trocars were removed under direct vision. There was good hemostasis.  I then enlarged the umbilical trocar site large enough to remove the prostate and I closed the fascia here with #0-0 Vicryl suture in a running fashion. All the port sites were irrigated. Lidocaine was injected into all the trocar sites. The skin was closed with 4-0 Monocryl in running subcuticular fashion. Dermabond was applied.   At this point patient was awakened and extubated in the operating room and taken to the recovery room in stable condition. There were no  complications. All counts correct.  [name], MD

## 2018-04-26 NOTE — Anesthesia Procedure Notes (Signed)
Procedure Name: Intubation Date/Time: 04/26/2018 1:43 PM Performed by: Aline Brochure, CRNA Pre-anesthesia Checklist: Patient identified, Emergency Drugs available, Suction available and Patient being monitored Patient Re-evaluated:Patient Re-evaluated prior to induction Oxygen Delivery Method: Circle system utilized Preoxygenation: Pre-oxygenation with 100% oxygen Induction Type: IV induction Ventilation: Mask ventilation without difficulty Laryngoscope Size: Mac and 4 Grade View: Grade I Tube type: Oral Tube size: 7.5 mm Number of attempts: 1 Airway Equipment and Method: Stylet Placement Confirmation: ETT inserted through vocal cords under direct vision,  positive ETCO2 and breath sounds checked- equal and bilateral Secured at: 24 cm Tube secured with: Tape Dental Injury: Teeth and Oropharynx as per pre-operative assessment

## 2018-04-26 NOTE — Anesthesia Procedure Notes (Signed)
Arterial Line Insertion Start/End9/30/2019 1:50 PM, 04/26/2018 1:55 PM Performed by: Alvin Critchley, MD, Aline Brochure, CRNA  Patient location: OR. Preanesthetic checklist: patient identified, IV checked, site marked, risks and benefits discussed, surgical consent, monitors and equipment checked and pre-op evaluation radial was placed Catheter size: 20 G Hand hygiene performed  Allen's test indicative of satisfactory collateral circulation Attempts: 1 Procedure performed without using ultrasound guided technique. Following insertion, dressing applied and Biopatch. Post procedure assessment: normal  Patient tolerated the procedure well with no immediate complications.

## 2018-04-26 NOTE — Anesthesia Preprocedure Evaluation (Addendum)
Anesthesia Evaluation  Patient identified by MRN, date of birth, ID band Patient awake    Reviewed: Allergy & Precautions, NPO status , Patient's Chart, lab work & pertinent test results  Airway Mallampati: III  TM Distance: >3 FB     Dental  (+) Chipped   Pulmonary former smoker,           Cardiovascular hypertension,      Neuro/Psych negative neurological ROS  negative psych ROS   GI/Hepatic Neg liver ROS,   Endo/Other  negative endocrine ROS  Renal/GU negative Renal ROS  negative genitourinary   Musculoskeletal negative musculoskeletal ROS (+)   Abdominal   Peds negative pediatric ROS (+)  Hematology negative hematology ROS (+)   Anesthesia Other Findings Past Medical History: No date: Elevated PSA No date: Hypertension 2019: Prostate cancer ([place]) 3754: Umbilical hernia  Reproductive/Obstetrics                            Anesthesia Physical Anesthesia Plan  ASA: II  Anesthesia Plan: General   Post-op Pain Management:    Induction: Intravenous  PONV Risk Score and Plan:   Airway Management Planned: Oral ETT  Additional Equipment:   Intra-op Plan:   Post-operative Plan: Extubation in OR  Informed Consent: I have reviewed the patients History and Physical, chart, labs and discussed the procedure including the risks, benefits and alternatives for the proposed anesthesia with the patient or authorized representative who has indicated his/her understanding and acceptance.   Dental advisory given  Plan Discussed with: CRNA and Surgeon  Anesthesia Plan Comments:         Anesthesia Quick Evaluation

## 2018-04-26 NOTE — Transfer of Care (Signed)
Immediate Anesthesia Transfer of Care Note  Patient: Jeffrey Whitaker  Procedure(s) Performed: ROBOTIC ASSISTED LAPAROSCOPIC RADICAL PROSTATECTOMY (N/A ) PELVIC LYMPH NODE DISSECTION (Bilateral )  Patient Location: PACU  Anesthesia Type:General  Level of Consciousness: sedated  Airway & Oxygen Therapy: Patient connected to face mask oxygen  Post-op Assessment: Post -op Vital signs reviewed and stable  Post vital signs: stable  Last Vitals:  Vitals Value Taken Time  BP 132/82 04/26/2018  6:31 PM  Temp    Pulse 98 04/26/2018  6:31 PM  Resp 19 04/26/2018  6:31 PM  SpO2 99 % 04/26/2018  6:31 PM    Last Pain:  Vitals:   04/26/18 1057  TempSrc: Temporal  PainSc: 0-No pain         Complications: No apparent anesthesia complications

## 2018-04-26 NOTE — H&P (Signed)
03/16/2018  --> updated 04/26/2018 No changes since last visit [place] 06-26-1961 [id number]  Referring provider: Steele Sizer, MD [address] [facility] [place], [place] [address]      Chief Complaint  Patient presents with  . Prostate Cancer    discuss surgery    HPI: 57 year old male with intermediate risk prostate cancer who presents today to for further discussion of possible radical prostatectomy.  He was previously seen, evaluated, and biopsy by Dr. Bernardo Heater.  He initially was seen and evaluated for PSA of 4.5 as well as firmness of the left prostate.  He underwent prostate biopsy on 02/08/2018 revealing 5/12 cores, 3 of 6 on the right side with Gleason 3+4 involving 46 to 81% of the tissue, and 2 of 6 on the left with Gleason 3+3.  Prostate volume 55 g.  He has no significant baseline voiding symptoms.  IPSS as below.  No baseline erectile dysfunction, SHIM as below.           IPSS           Row Name 03/16/18 1600               International Prostate Symptom Score    How often have you had the sensation of not emptying your bladder?  Not at All      How often have you had to urinate less than every two hours?  Less than 1 in 5 times      How often have you found you stopped and started again several times when you urinated?  Less than 1 in 5 times      How often have you found it difficult to postpone urination?  Not at All      How often have you had a weak urinary stream?  Less than 1 in 5 times      How often have you had to strain to start urination?  Not at All      How many times did you typically get up at night to urinate?  1 Time      Total IPSS Score  4             Quality of Life due to urinary symptoms    If you were to spend the rest of your life with your urinary condition just the way it is now how would you feel about that?  Delighted         Score:  1-7  Mild 8-19 Moderate 20-35 Severe          SHIM           Row Name 03/16/18 1624               SHIM: Over the last 6 months:    How do you rate your confidence that you could get and keep an erection?  Very High      When you had erections with sexual stimulation, how often were your erections hard enough for penetration (entering your partner)?  Almost Always or Always      During sexual intercourse, how often were you able to maintain your erection after you had penetrated (entered) your partner?  Almost Always or Always      During sexual intercourse, how difficult was it to maintain your erection to completion of intercourse?  Not Difficult      When you attempted sexual intercourse, how often was it satisfactory for you?  Most Times (much more than half the time)             SHIM Total Score    SHIM  24          PMH:     Past Medical History:  Diagnosis Date  . Elevated PSA   . Hypertension     Surgical History:      Past Surgical History:  Procedure Laterality Date  . KNEE SURGERY     at age 89     Home Medications:  Allergies as of 03/16/2018   No Known Allergies              Medication List            Accurate as of 03/16/18  5:00 PM. Always use your most recent med list.           aspirin 81 MG chewable tablet Chew 1 tablet by mouth daily.   Multiple Vitamin tablet Take 1 tablet by mouth daily.   pravastatin 40 MG tablet Commonly known as:  PRAVACHOL Take 1 tablet (40 mg total) by mouth daily.   quinapril-hydrochlorothiazide 20-12.5 MG tablet Commonly known as:  ACCURETIC Take 1 tablet by mouth daily.       Allergies: No Known Allergies  Family History:      Family History  Problem Relation Age of Onset  . Hypertension Mother   . CAD Mother   . Benign prostatic hyperplasia Maternal Uncle   . Kidney disease Neg Hx   . Prostate cancer Neg Hx   . Bladder Cancer Neg Hx    . Kidney cancer Neg Hx     Social History:  reports that he has quit smoking. He quit smokeless tobacco use about 30 years ago. He reports that he does not drink alcohol or use drugs.  ROS: UROLOGY Frequent Urination?: No Hard to postpone urination?: No Burning/pain with urination?: No Get up at night to urinate?: No Leakage of urine?: No Urine stream starts and stops?: No Trouble starting stream?: No Do you have to strain to urinate?: No Blood in urine?: No Urinary tract infection?: No Sexually transmitted disease?: No Injury to kidneys or bladder?: No Painful intercourse?: No Weak stream?: No Erection problems?: No Penile pain?: No  Gastrointestinal Nausea?: No Vomiting?: No Indigestion/heartburn?: No Diarrhea?: No Constipation?: No  Constitutional Fever: No Night sweats?: No Weight loss?: No Fatigue?: No  Skin Skin rash/lesions?: No Itching?: No  Eyes Blurred vision?: No Double vision?: No  Ears/Nose/Throat Sore throat?: No Sinus problems?: No  Hematologic/Lymphatic Swollen glands?: No Easy bruising?: No  Cardiovascular Leg swelling?: No Chest pain?: No  Respiratory Cough?: No Shortness of breath?: No  Endocrine Excessive thirst?: No  Musculoskeletal Back pain?: No Joint pain?: No  Neurological Headaches?: No Dizziness?: No  Psychologic Depression?: No Anxiety?: No  Physical Exam: BP 123/76   Pulse 67   Ht 5\' 11"  (1.803 m)   Wt 230 lb (104.3 kg)   BMI 32.08 kg/m   Constitutional:  Alert and oriented, No acute distress. HEENT: [place] AT, moist mucus membranes.  Trachea midline, no masses. Cardiovascular: No clubbing, cyanosis, or edema. Respiratory: Normal respiratory effort, no increased work of breathing. GI: Abdomen is soft, nontender, nondistended, no abdominal masses.  Small quarter sized umbilical hernia, likely fat-containing.   Skin: No rashes, bruises or suspicious lesions. Neurologic: Grossly intact,  no focal deficits, moving all 4 extremities. Psychiatric: Normal mood and affect.  Laboratory Data: RecentLabs  Lab Results  Component Value Date   WBC 6.5 02/18/2018   HGB 14.0 02/18/2018   HCT 44.2 02/18/2018   MCV 76 (L) 02/18/2018   PLT 206 02/18/2018      RecentLabs       Lab Results  Component Value Date   CREATININE 1.23 02/18/2018      RecentLabs       Lab Results  Component Value Date   PSA 3.2 12/12/2014      RecentLabs       Lab Results  Component Value Date   TESTOSTERONE 400 04/17/2016      RecentLabs       Lab Results  Component Value Date   HGBA1C 6.0 (H) 02/18/2018      Urinalysis Labs(Brief)          Component Value Date/Time   APPEARANCEUR Clear 03/16/2018 1547   GLUCOSEU Negative 03/16/2018 1547   BILIRUBINUR Negative 03/16/2018 1547   PROTEINUR Negative 03/16/2018 1547   NITRITE Negative 03/16/2018 1547   LEUKOCYTESUR Negative 03/16/2018 1547       Pertinent Imaging: n/a  Assessment & Plan:    1. Prostate cancer [facility]) 57 year old male with newly diagnosed intermediate risk prostate cancer.  MSK nomogram reviewed, approximately 5% risk of lymph node involvement.  No indication for further diagnostic imaging based on guidelines.  The patient was counseled about the natural history of prostate cancer and the standard treatment options that are available for prostate cancer. It was explained to him how his age and life expectancy, clinical stage, Gleason score, and PSA affect his prognosis, the decision to proceed with additional staging studies, as well as how that information influences recommended treatment strategies. We discussed the roles for active surveillance, radiation therapy, surgical therapy, androgen deprivation, as well as ablative therapy options for the treatment of prostate cancer as appropriate to his individual cancer situation. We discussed the risks and benefits of  these options with regard to their impact on cancer control and also in terms of potential adverse events, complications, and impact on quality of life particularly related to urinary, bowel, and sexual function. The patient was encouraged to ask questions throughout the discussion today and all questions were answered to his stated satisfaction. In addition, the patient was providedwith and/or directed to appropriate resources and literature for further education about prostate cancer treatment options.  We discussed surgical therapy for prostate cancer including the different available surgical approaches. We discussed, in detail, the risks and expectations of surgery with regard to cancer control, urinary control, and erectile dysfunction as well as expected post operative cover he processed. Additional risks of surgery including but not limited to bleeding, infection, hernia formation, nerve damage, steel formation, bowel/rect injury, potentially necessitating colostomy, damage to the urinary tract resulting in urinary leakage, urethral stricture, and cardiopulmonary risk such as myocardial infarction, stroke, death, thromboembolism etc. were explained. The risk of open surgical conversion for robotics/laparoscopic prostatectomy is also discussed.  Given his age, I do feel that he would be best served with surgery as compared to radiation with the option for salvage/adjuvant radiation for adverse pathological features or recurrence.  He understands this and is agreeable with the plan.  He was offered referral to radiation oncology but declined.  Given that his more significant disease is unilateral, we will plan for a nerve sparing procedure on the side of his low risk cancer with either partial or non-nerve sparing on the side of his more significant disease, to be determined at  the time of surgery based on intraoperative findings.  He is agreeable this plan.  All questions were answered today in  detail.   2. Umbilical hernia without obstruction and without gangrene Small asymptomatic umbilical hernia He is interested in having this treated at the time of surgery We will recheck to general surgery to see if they like to be involved with the surgery to close the hernia - otherwise will close with nonabsorbable suture at the time of port closure - Ambulatory referral to [facility], [address] [place], [place] [address] [phone]  I spent 40 min with this patient of which greater than 50% was spent in counseling and coordination of care with the patient.

## 2018-04-26 NOTE — Anesthesia Postprocedure Evaluation (Signed)
Anesthesia Post Note  Patient: Jeffrey Whitaker  Procedure(s) Performed: ROBOTIC ASSISTED LAPAROSCOPIC RADICAL PROSTATECTOMY (N/A ) PELVIC LYMPH NODE DISSECTION (Bilateral )  Patient location during evaluation: PACU Anesthesia Type: General Level of consciousness: awake and alert Pain management: pain level controlled Vital Signs Assessment: post-procedure vital signs reviewed and stable Respiratory status: spontaneous breathing, nonlabored ventilation, respiratory function stable and patient connected to nasal cannula oxygen Cardiovascular status: blood pressure returned to baseline and stable Postop Assessment: no apparent nausea or vomiting Anesthetic complications: no     Last Vitals:  Vitals:   04/26/18 2002 04/26/18 2032  BP: 134/82 135/75  Pulse: 86 87  Resp: 20 17  Temp: 36.7 C 36.7 C  SpO2: 97% 100%    Last Pain:  Vitals:   04/26/18 2032  TempSrc: Oral  PainSc:                  Eliezer Khawaja S

## 2018-04-26 NOTE — Progress Notes (Signed)
Medication substitution  Per P&T committee policy quinapril/hydrochlorothiazide 20mg /12.5mg  substituted for lisinopril/hydrochlorotiazide 20mg /12.5mg .  Evelena Asa, PharmD 04/26/18 2024

## 2018-04-27 ENCOUNTER — Encounter: Payer: Self-pay | Admitting: Urology

## 2018-04-27 DIAGNOSIS — C61 Malignant neoplasm of prostate: Secondary | ICD-10-CM | POA: Diagnosis not present

## 2018-04-27 LAB — BASIC METABOLIC PANEL
ANION GAP: 8 (ref 5–15)
BUN: 18 mg/dL (ref 6–20)
CALCIUM: 8.5 mg/dL — AB (ref 8.9–10.3)
CHLORIDE: 108 mmol/L (ref 98–111)
CO2: 25 mmol/L (ref 22–32)
Creatinine, Ser: 1.29 mg/dL — ABNORMAL HIGH (ref 0.61–1.24)
GFR calc non Af Amer: 60 mL/min — ABNORMAL LOW (ref >60)
Glucose, Bld: 130 mg/dL — ABNORMAL HIGH (ref 70–99)
Potassium: 4.9 mmol/L (ref 3.5–5.1)
Sodium: 141 mmol/L (ref 135–145)

## 2018-04-27 LAB — CBC
HEMATOCRIT: 36.2 % — AB (ref 40.0–52.0)
HEMOGLOBIN: 11.8 g/dL — AB (ref 13.0–18.0)
MCH: 24.4 pg — ABNORMAL LOW (ref 26.0–34.0)
MCHC: 32.6 g/dL (ref 32.0–36.0)
MCV: 74.7 fL — ABNORMAL LOW (ref 80.0–100.0)
Platelets: 181 10*3/uL (ref 150–440)
RBC: 4.84 MIL/uL (ref 4.40–5.90)
RDW: 14.5 % (ref 11.5–14.5)
WBC: 13 10*3/uL — AB (ref 3.8–10.6)

## 2018-04-27 MED ORDER — DOCUSATE SODIUM 100 MG PO CAPS: 100.0000 mg | ORAL_CAPSULE | Freq: Two times a day (BID) | ORAL | 0 refills | Status: DC

## 2018-04-27 MED ORDER — OXYBUTYNIN CHLORIDE 5 MG PO TABS: 5.0000 mg | ORAL_TABLET | Freq: Three times a day (TID) | ORAL | 0 refills | Status: DC | PRN

## 2018-04-27 MED ORDER — INFLUENZA VAC SPLIT QUAD 0.5 ML IM SUSY
0.5000 mL | PREFILLED_SYRINGE | Freq: Once | INTRAMUSCULAR | Status: AC
  Administered 2018-04-27: 0.5 mL via INTRAMUSCULAR
  Filled 2018-04-27: qty 0.5

## 2018-04-27 MED ORDER — INFLUENZA VAC SPLIT QUAD 0.5 ML IM SUSY: 0.5000 mL | PREFILLED_SYRINGE | INTRAMUSCULAR | Status: DC

## 2018-04-27 MED ORDER — OXYCODONE-ACETAMINOPHEN 5-325 MG PO TABS: 1.0000 | ORAL_TABLET | ORAL | 0 refills | Status: DC | PRN

## 2018-04-27 NOTE — Plan of Care (Signed)
  Problem: Education: Goal: Required Educational Video(s) Outcome: Progressing   Problem: Clinical Measurements: Goal: Ability to maintain clinical measurements within normal limits will improve Outcome: Progressing Goal: Postoperative complications will be avoided or minimized Outcome: Progressing   Problem: Skin Integrity: Goal: Demonstration of wound healing without infection will improve Outcome: Progressing   Problem: Education: Goal: Knowledge of General Education information will improve Description Including pain rating scale, medication(s)/side effects and non-pharmacologic comfort measures Outcome: Progressing   Problem: Health Behavior/Discharge Planning: Goal: Ability to manage health-related needs will improve Outcome: Progressing   Problem: Clinical Measurements: Goal: Ability to maintain clinical measurements within normal limits will improve Outcome: Progressing Goal: Will remain free from infection Outcome: Progressing Goal: Diagnostic test results will improve Outcome: Progressing Goal: Respiratory complications will improve Outcome: Progressing Goal: Cardiovascular complication will be avoided Outcome: Progressing   Problem: Activity: Goal: Risk for activity intolerance will decrease Outcome: Progressing   Problem: Nutrition: Goal: Adequate nutrition will be maintained Outcome: Progressing   Problem: Coping: Goal: Level of anxiety will decrease Outcome: Progressing   Problem: Elimination: Goal: Will not experience complications related to bowel motility Outcome: Progressing Goal: Will not experience complications related to urinary retention Outcome: Progressing Foley in place    Problem: Pain Managment: Goal: General experience of comfort will improve Outcome: Progressing No PRN medications needed   Problem: Safety: Goal: Ability to remain free from injury will improve Outcome: Progressing   Problem: Skin Integrity: Goal: Risk for  impaired skin integrity will decrease Outcome: Progressing

## 2018-04-27 NOTE — Discharge Instructions (Signed)
· Activity:  You are encouraged to ambulate frequently (about every hour during waking hours) to help prevent blood clots from forming in your legs or lungs.  However, you should not engage in any heavy lifting (> 5-10 lbs), strenuous activity, or straining. ° °· Diet: You should advance your diet as instructed by your physician.  It will be normal to have some bloating, nausea, and abdominal discomfort intermittently. ° °· Prescriptions:  You will be provided a prescription for pain medication to take as needed.  If your pain is not severe enough to require the prescription pain medication, you may take extra strength Tylenol instead which will have less side effects.  You should also take a prescribed stool softener to avoid straining with bowel movements as the prescription pain medication may constipate you. ° °· Incisions: You may remove your dressing bandages 48 hours after surgery if not removed in the hospital.  You will either have some small staples or special tissue glue at each of the incision sites. Once the bandages are removed (if present), the incisions may stay open to air.  You may start showering (but not soaking or bathing in water) the 2nd day after surgery and the incisions simply need to be patted dry after the shower.  No additional care is needed. ° °What to call us about: You should call the office if you develop fever > 101 or develop persistent vomiting, redness or draining around your incision, or any other concerning symptoms.   ° °[facility] °[address], [address] °[place], [place] [address] °[phone] ° ° °Indwelling Urinary Catheter Care, Adult °Take good care of your catheter to keep it working and to prevent problems. °How to wear your catheter °Attach your catheter to your leg with tape (adhesive tape) or a leg strap. Make sure it is not too tight. If you use tape, remove any bits of tape that are already on the catheter. °How to wear a drainage  bag °You should have: °· A large overnight bag. °· A small leg bag. ° °Overnight Bag °You may wear the overnight bag at any time. Always keep the bag below the level of your bladder but off the floor. When you sleep, put a clean plastic bag in a wastebasket. Then hang the bag inside the wastebasket. °Leg Bag °Never wear the leg bag at night. Always wear the leg bag below your knee. Keep the leg bag secure with a leg strap or tape. °How to care for your skin °· Clean the skin around the catheter at least once every day. °· Shower every day. Do not take baths. °· Put creams, lotions, or ointments on your genital area only as told by your doctor. °· Do not use powders, sprays, or lotions on your genital area. °How to clean your catheter and your skin °1. Wash your hands with soap and water. °2. Wet a washcloth in warm water and gentle (mild) soap. °3. Use the washcloth to clean the skin where the catheter enters your body. Clean downward and wipe away from the catheter in small circles. Do not wipe toward the catheter. °4. Pat the area dry with a clean towel. Make sure to clean off all soap. °How to care for your drainage bags °Empty your drainage bag when it is ?-½ full or at least 2-3 times a day. Replace your drainage bag once a month or sooner if it starts to smell bad or look dirty. Do not clean your   drainage bag unless told by your doctor. °Emptying a drainage bag ° °Supplies Needed °· Rubbing alcohol. °· Gauze pad or cotton ball. °· Tape or a leg strap. ° °Steps °1. Wash your hands with soap and water. °2. Separate (detach) the bag from your leg. °3. Hold the bag over the toilet or a clean container. Keep the bag below your hips and bladder. This stops pee (urine) from going back into the tube. °4. Open the pour spout at the bottom of the bag. °5. Empty the pee into the toilet or container. Do not let the pour spout touch any surface. °6. Put rubbing alcohol on a gauze pad or cotton ball. °7. Use the gauze pad  or cotton ball to clean the pour spout. °8. Close the pour spout. °9. Attach the bag to your leg with tape or a leg strap. °10. Wash your hands. ° °Changing a drainage bag °Supplies Needed °· Alcohol wipes. °· A clean drainage bag. °· Adhesive tape or a leg strap. ° °Steps °1. Wash your hands with soap and water. °2. Separate the dirty bag from your leg. °3. Pinch the rubber catheter with your fingers so that pee does not spill out. °4. Separate the catheter tube from the drainage tube where these tubes connect (at the connection valve). Do not let the tubes touch any surface. °5. Clean the end of the catheter tube with an alcohol wipe. Use a different alcohol wipe to clean the end of the drainage tube. °6. Connect the catheter tube to the drainage tube of the clean bag. °7. Attach the new bag to the leg with adhesive tape or a leg strap. °8. Wash your hands. ° °How to prevent infection and other problems °· Never pull on your catheter or try to remove it. Pulling can damage tissue in your body. °· Always wash your hands before and after touching your catheter. °· If a leg strap gets wet, replace it with a dry one. °· Drink enough fluids to keep your pee clear or pale yellow, or as told by your doctor. °· Do not let the drainage bag or tubing touch the floor. °· Wear cotton underwear. °· If you are male, wipe from front to back after you poop (have a bowel movement). °· Check on the catheter often to make sure it works and the tubing is not twisted. °Get help if: °· Your pee is cloudy. °· Your pee smells unusually bad. °· Your pee is not draining into the bag. °· Your tube gets clogged. °· Your catheter starts to leak. °· Your bladder feels full. °Get help right away if: °· You have redness, swelling, or pain where the catheter enters your body. °· You have fluid, pus, or a bad smell coming from the area where the catheter enters your body. °· The area where the catheter enters your body feels warm. °· You have a  fever. °· You have pain in your: °? Stomach (abdomen). °? Legs. °? Lower back. °? Bladder. °· You see blood fill the catheter. °· Your pee is pink or red. °· You feel sick to your stomach (nauseous). °· You throw up (vomit). °· You have chills. °· Your catheter gets pulled out. °This information is not intended to replace advice given to you by your health care provider. Make sure you discuss any questions you have with your health care provider. °Document Released: 11/08/2012 Document Revised: 06/11/2016 Document Reviewed: 12/27/2013 °Elsevier Interactive Patient Education © 2018 [employer] ° °

## 2018-04-27 NOTE — Progress Notes (Signed)
Discharge order received. Patient is alert and oriented. Vital signs stable . No signs of acute distress. Discharge instructions given. Patient verbalized understanding. No other issues noted at this time.   

## 2018-04-27 NOTE — Progress Notes (Signed)
IS education complete, pt understands technique and reason for use. Pt inspire 2532ml+. Pt independent with use.

## 2018-04-27 NOTE — Discharge Summary (Signed)
Date of admission: 04/26/2018  Date of discharge: 04/27/2018  Admission diagnosis: Prostate cancer  Discharge diagnosis: Prostate cancer  Secondary diagnoses:  Patient Active Problem List   Diagnosis Date Noted  . Prostate cancer ([place]) 04/26/2018  . Allergic rhinitis, seasonal 04/15/2015  . Abnormal blood sugar 04/11/2015  . Benign hypertension 04/11/2015  . Dyslipidemia 04/11/2015  . Obesity (BMI 30.0-34.9) 04/11/2015  . Right medial knee pain 04/11/2015  . Metabolic syndrome 01/60/1093  . Left shoulder pain 04/11/2015  . Umbilical hernia 23/55/7322  . Microcytosis 04/11/2015    History and Physical: For full details, please see admission history and physical. Briefly, Jeffrey Whitaker is a 57 y.o. year old patient with intermediate risk prostate cancer with radical prostatectomy with bilateral lymph node dissection admitted for postoperative observation.  Hospital Course: Patient tolerated the procedure well.  He was then transferred to the floor after an uneventful PACU stay.  His hospital course was uncomplicated.  On POD#1 he had met discharge criteria: was eating a regular diet, was up and ambulating independently,  pain was well controlled, and was ready to for discharge.  Foley teaching was performed prior to discharge.  Physical Exam  Constitutional: He appears well-developed and well-nourished.  HENT:  Head: Normocephalic and atraumatic.  Eyes: Pupils are equal, round, and reactive to light.  Cardiovascular: Normal rate.  Pulmonary/Chest: Effort normal. No respiratory distress.  Abdominal: Soft. He exhibits no distension.  Wounds clean dry and intact.  Mild bruising around the umbilical incision.  No rebound or guarding.  Nondistended.  Genitourinary: Penis normal.  Genitourinary Comments: Foley catheter in place draining clear yellow urine, secured to right thigh  Neurological: He is alert.  Skin: Skin is warm and dry.  Vitals reviewed.    Laboratory values:   Recent Labs    04/27/18 0341  WBC 13.0*  HGB 11.8*  HCT 36.2*   Recent Labs    04/27/18 0341  NA 141  K 4.9  CL 108  CO2 25  GLUCOSE 130*  BUN 18  CREATININE 1.29*  CALCIUM 8.5*   No results for input(s): LABPT, INR in the last 72 hours. No results for input(s): LABURIN in the last 72 hours. Results for orders placed or performed during the hospital encounter of 04/19/18  Urine culture     Status: Abnormal   Collection Time: 04/19/18  8:44 AM  Result Value Ref Range Status   Specimen Description   Final    URINE, CLEAN CATCH Performed at [facility], [address] [place], [place] [address]    Special Requests   Final    NONE Performed at [facility], [address] [place], [place] [address]    Culture (A)  Final    <10,000 COLONIES/mL INSIGNIFICANT GROWTH Performed at [facility] [address] [place], [place] [address]    Report Status 04/20/2018 FINAL  Final    Disposition: Home  Discharge instruction: See discharge instructions in epic  Discharge medications:  Allergies as of 04/27/2018   No Known Allergies     Medication List    TAKE these medications   aspirin EC 81 MG tablet Take 81 mg by mouth daily.   docusate sodium 100 MG capsule Commonly known as:  COLACE Take 1 capsule (100 mg total) by mouth 2 (two) times daily.   oxybutynin 5 MG tablet Commonly known as:  DITROPAN Take 1 tablet (5 mg total) by mouth every 8 (eight) hours as needed for bladder spasms.  oxyCODONE-acetaminophen 5-325 MG tablet Commonly known as:  PERCOCET/ROXICET Take 1-2 tablets by mouth every 4 (four) hours as needed for moderate pain or severe pain.   pravastatin 40 MG tablet Commonly known as:  PRAVACHOL Take 1 tablet (40 mg total) by mouth daily. What changed:  when to take this   quinapril-hydrochlorothiazide 20-12.5 MG tablet Commonly known as:  ACCURETIC TAKE 1 TABLET BY MOUTH  DAILY       Followup:  Follow-up  Information    [name], MD.   Specialty:  Urology Why:  as previously scheduled for foley removal and then 4 week post op visit with labs prior Contact information: [address] [place] [address] [phone]

## 2018-04-30 ENCOUNTER — Other Ambulatory Visit: Payer: Self-pay | Admitting: Urology

## 2018-05-03 ENCOUNTER — Ambulatory Visit (INDEPENDENT_AMBULATORY_CARE_PROVIDER_SITE_OTHER): Payer: 59 | Admitting: Family Medicine

## 2018-05-03 DIAGNOSIS — C61 Malignant neoplasm of prostate: Secondary | ICD-10-CM

## 2018-05-03 LAB — SURGICAL PATHOLOGY

## 2018-05-03 NOTE — Progress Notes (Signed)
Catheter Removal  Patient is present today for a catheter removal.  10ml of water was drained from the balloon. A 18FR foley cath was removed from the bladder no complications were noted . Patient tolerated well.  Preformed by: Carrie Garrison, CMA   

## 2018-05-06 ENCOUNTER — Inpatient Hospital Stay: Payer: 59 | Attending: Urology

## 2018-05-19 ENCOUNTER — Other Ambulatory Visit: Payer: Self-pay | Admitting: Family Medicine

## 2018-05-19 DIAGNOSIS — C61 Malignant neoplasm of prostate: Secondary | ICD-10-CM

## 2018-05-20 ENCOUNTER — Ambulatory Visit (INDEPENDENT_AMBULATORY_CARE_PROVIDER_SITE_OTHER): Payer: 59 | Admitting: Family Medicine

## 2018-05-20 ENCOUNTER — Other Ambulatory Visit: Payer: 59

## 2018-05-20 ENCOUNTER — Encounter: Payer: Self-pay | Admitting: Family Medicine

## 2018-05-20 VITALS — BP 122/84 | HR 81 | Temp 98.0°F | Resp 16 | Ht 71.0 in | Wt 233.8 lb

## 2018-05-20 DIAGNOSIS — Z0000 Encounter for general adult medical examination without abnormal findings: Secondary | ICD-10-CM | POA: Diagnosis not present

## 2018-05-20 DIAGNOSIS — C61 Malignant neoplasm of prostate: Secondary | ICD-10-CM

## 2018-05-20 NOTE — Progress Notes (Signed)
Name: Jeffrey Whitaker   MRN: [id number]    DOB: 05-Jul-1961   Date:05/20/2018       Progress Note  Subjective  Chief Complaint  Chief Complaint  Patient presents with  . Annual Exam    HPI  Patient presents for annual CPE   Diet: he cooks at home, not eating as healthy since his prostate surgery.  Exercise: he recently had surgery, but is walking and using elliptical again   Depression:  Depression screen [facility] 2/9 05/20/2018 02/10/2018 04/22/2017 10/17/2016 04/17/2016  Decreased Interest 0 0 0 0 0  Down, Depressed, Hopeless 0 0 0 0 0  PHQ - 2 Score 0 0 0 0 0  Altered sleeping 0 0 - - -  Tired, decreased energy 0 0 - - -  Change in appetite 0 0 - - -  Feeling bad or failure about yourself  0 0 - - -  Trouble concentrating 0 0 - - -  Moving slowly or fidgety/restless 0 0 - - -  Suicidal thoughts 0 0 - - -  PHQ-9 Score 0 0 - - -  Difficult doing work/chores Not difficult at all - - - -    Hypertension:  BP Readings from Last 3 Encounters:  05/20/18 122/84  04/27/18 (!) 108/55  04/19/18 125/76    Obesity: Wt Readings from Last 3 Encounters:  05/20/18 233 lb 12.8 oz (106.1 kg)  04/26/18 232 lb (105.2 kg)  04/19/18 232 lb (105.2 kg)   BMI Readings from Last 3 Encounters:  05/20/18 32.61 kg/m  04/26/18 32.36 kg/m  04/19/18 32.36 kg/m     Lipids:  Lab Results  Component Value Date   CHOL 150 02/18/2018   CHOL 135 10/18/2016   CHOL 135 10/17/2016   Lab Results  Component Value Date   HDL 53 02/18/2018   HDL 51 10/17/2016   HDL 48 10/15/2015   Lab Results  Component Value Date   LDLCALC 82 02/18/2018   LDLCALC 74 10/17/2016   LDLCALC 75 10/15/2015   Lab Results  Component Value Date   TRIG 77 02/18/2018   TRIG 49 10/17/2016   TRIG 77 10/15/2015   Lab Results  Component Value Date   CHOLHDL 2.8 02/18/2018   CHOLHDL 2.6 10/17/2016   CHOLHDL 2.9 10/15/2015   No results found for: LDLDIRECT Glucose:  Glucose  Date Value Ref Range Status   02/18/2018 95 65 - 99 mg/dL Final  10/17/2016 93 65 - 99 mg/dL Final  10/15/2015 107 (H) 65 - 99 mg/dL Final   Glucose, Bld  Date Value Ref Range Status  04/27/2018 130 (H) 70 - 99 mg/dL Final  04/19/2018 102 (H) 70 - 99 mg/dL Final      [facility] Visit from 05/20/2018 in [facility]  AUDIT-C Score  0       Married STD testing and prevention (HIV/chl/gon/syphilis):N/A Hep C: up to date  Skin cancer: discussed atypical lesions  Colorectal cancer: 2014  Prostate cancer: recently had prostatectomy, under the care of Dr. Erlene Quan   Lung cancer:   Low Dose CT Chest recommended if Age 57-80 years, 30 pack-year currently smoking OR have quit w/in 15years. Patient does not qualify.   ECG:  03/2018  Advanced Care Planning: A voluntary discussion about advance care planning including the explanation and discussion of advance directives.  Discussed health care proxy and Living will, and the patient was able to identify a health care proxy as wife   Patient does not have  a living will at present time. .  Patient Active Problem List   Diagnosis Date Noted  . Prostate cancer ([place]) 04/26/2018  . Allergic rhinitis, seasonal 04/15/2015  . Abnormal blood sugar 04/11/2015  . Benign hypertension 04/11/2015  . Dyslipidemia 04/11/2015  . Obesity (BMI 30.0-34.9) 04/11/2015  . Right medial knee pain 04/11/2015  . Metabolic syndrome 85/63/1497  . Left shoulder pain 04/11/2015  . Umbilical hernia 02/63/7858  . Microcytosis 04/11/2015    Past Surgical History:  Procedure Laterality Date  . COLONOSCOPY    . KNEE SURGERY Right    at age 32; clean out  . PELVIC LYMPH NODE DISSECTION Bilateral 04/26/2018   Procedure: PELVIC LYMPH NODE DISSECTION;  Surgeon: [name], MD;  Location: [facility] ORS;  Service: Urology;  Laterality: Bilateral;  . ROBOT ASSISTED LAPAROSCOPIC RADICAL PROSTATECTOMY N/A 04/26/2018   Procedure: ROBOTIC ASSISTED LAPAROSCOPIC RADICAL PROSTATECTOMY;   Surgeon: [name], MD;  Location: [facility] ORS;  Service: Urology;  Laterality: N/A;    Family History  Problem Relation Age of Onset  . Hypertension Mother   . CAD Mother   . Lung cancer Mother   . Benign prostatic hyperplasia Maternal Uncle   . Hypertension Sister   . Kidney disease Neg Hx   . Prostate cancer Neg Hx   . Bladder Cancer Neg Hx   . Kidney cancer Neg Hx     Social History   Socioeconomic History  . Marital status: Married    Spouse name: Santiago Glad  . Number of children: 2  . Years of education: Not on file  . Highest education level: Associate degree: occupational, Hotel manager, or vocational program  Occupational History  . Not on file  Social Needs  . Financial resource strain: Not hard at all  . Food insecurity:    Worry: Never true    Inability: Never true  . Transportation needs:    Medical: No    Non-medical: No  Tobacco Use  . Smoking status: Former Smoker    Years: 2.00    Types: Cigarettes    Start date: 07/28/1980    Last attempt to quit: 07/28/1982    Years since quitting: 57.0  . Smokeless tobacco: Former Systems developer    Quit date: 07/29/1987  Substance and Sexual Activity  . Alcohol use: Yes    Alcohol/week: 0.0 standard drinks    Comment: rarely  . Drug use: No  . Sexual activity: Yes    Partners: Female  Lifestyle  . Physical activity:    Days per week: 4 days    Minutes per session: 30 min  . Stress: Not at all  Relationships  . Social connections:    Talks on phone: More than three times a week    Gets together: Twice a week    Attends religious service: Never    Active member of club or organization: No    Attends meetings of clubs or organizations: Never    Relationship status: Married  . Intimate partner violence:    Fear of current or ex partner: No    Emotionally abused: No    Physically abused: No    Forced sexual activity: No  Other Topics Concern  . Not on file  Social History Narrative   Patient has 2 step-children      Current Outpatient Medications:  .  aspirin EC 81 MG tablet, Take 81 mg by mouth daily., Disp: , Rfl:  .  docusate sodium (COLACE) 100 MG capsule, Take 1 capsule (100 mg  total) by mouth 2 (two) times daily., Disp: 60 capsule, Rfl: 0 .  Multiple Vitamins-Minerals (MENS 50+ MULTI VITAMIN/MIN PO), Take 1 tablet by mouth daily., Disp: , Rfl:  .  pravastatin (PRAVACHOL) 40 MG tablet, Take 1 tablet (40 mg total) by mouth daily. (Patient taking differently: Take 40 mg by mouth every evening. ), Disp: 90 tablet, Rfl: 1 .  quinapril-hydrochlorothiazide (ACCURETIC) 20-12.5 MG tablet, TAKE 1 TABLET BY MOUTH  DAILY, Disp: 90 tablet, Rfl: 0 .  oxybutynin (DITROPAN) 5 MG tablet, Take 1 tablet (5 mg total) by mouth every 8 (eight) hours as needed for bladder spasms. (Patient not taking: Reported on 05/20/2018), Disp: 30 tablet, Rfl: 0 .  oxyCODONE-acetaminophen (PERCOCET) 5-325 MG tablet, Take 1-2 tablets by mouth every 4 (four) hours as needed for moderate pain or severe pain. (Patient not taking: Reported on 05/20/2018), Disp: 15 tablet, Rfl: 0  No Known Allergies   ROS  Constitutional: Negative for fever or weight change.  Respiratory: Negative for cough and shortness of breath.   Cardiovascular: Negative for chest pain or palpitations.  Gastrointestinal: Negative for abdominal pain, no bowel changes.  Musculoskeletal: Negative for gait problem or joint swelling.  Skin: Negative for rash.  Neurological: Negative for dizziness or headache.  No other specific complaints in a complete review of systems (except as listed in HPI above).  Objective  Vitals:   05/20/18 0850  BP: 122/84  Pulse: 81  Resp: 16  Temp: 98 F (36.7 C)  TempSrc: Oral  SpO2: 96%  Weight: 233 lb 12.8 oz (106.1 kg)  Height: 5' 11"  (1.803 m)    Body mass index is 32.61 kg/m.  Physical Exam  Constitutional: Patient appears well-developed and obese. No distress.  HENT: Head: Normocephalic and atraumatic. Ears: B  TMs ok, no erythema or effusion; Nose: Nose normal. Mouth/Throat: Oropharynx is clear and moist. No oropharyngeal exudate.  Eyes: Conjunctivae and EOM are normal. Pupils are equal, round, and reactive to light. No scleral icterus.  Neck: Normal range of motion. Neck supple. No JVD present. No thyromegaly present.  Cardiovascular: Normal rate, regular rhythm and normal heart sounds.  No murmur heard. No BLE edema. Pulmonary/Chest: Effort normal and breath sounds normal. No respiratory distress. Abdominal: Soft. Bowel sounds are normal, no distension. There is no tenderness. no masses MALE GENITALIA: seeing Dr Erlene Quan  RECTAL: not done, had prostatectomy  Musculoskeletal: Normal range of motion, no joint effusions. No gross deformities Neurological: he is alert and oriented to person, place, and time. No cranial nerve deficit. Coordination, balance, strength, speech and gait are normal.  Skin: Skin is warm and dry. No rash noted. No erythema.  Psychiatric: Patient has a normal mood and affect. behavior is normal. Judgment and thought content normal.  Recent Results (from the past 2160 hour(s))  Urinalysis, Complete     Status: None   Collection Time: 03/16/18  3:47 PM  Result Value Ref Range   Specific Gravity, UA 1.020 1.005 - 1.030   pH, UA 5.0 5.0 - 7.5   Color, UA Yellow Yellow   Appearance Ur Clear Clear   Leukocytes, UA Negative Negative   Protein, UA Negative Negative/Trace   Glucose, UA Negative Negative   Ketones, UA Negative Negative   RBC, UA Negative Negative   Bilirubin, UA Negative Negative   Urobilinogen, Ur 0.2 0.2 - 1.0 mg/dL   Nitrite, UA Negative Negative  CULTURE, URINE COMPREHENSIVE     Status: None   Collection Time: 03/16/18  4:02 PM  Result Value Ref Range   Urine Culture, Comprehensive Final report    Organism ID, Bacteria Comment     Comment: No growth in 36 - 48 hours.  Type and screen     Status: None   Collection Time: 04/19/18  8:02 AM  Result Value  Ref Range   ABO/RH(D) O POS    Antibody Screen NEG    Sample Expiration 05/03/2018    Extend sample reason      NO TRANSFUSIONS OR PREGNANCY IN THE PAST 3 MONTHS Performed at [facility], [address] [place], [place] [address]   Basic metabolic panel     Status: Abnormal   Collection Time: 04/19/18  8:44 AM  Result Value Ref Range   Sodium 141 135 - 145 mmol/L   Potassium 4.0 3.5 - 5.1 mmol/L   Chloride 104 98 - 111 mmol/L   CO2 30 22 - 32 mmol/L   Glucose, Bld 102 (H) 70 - 99 mg/dL   BUN 27 (H) 6 - 20 mg/dL   Creatinine, Ser 1.18 0.61 - 1.24 mg/dL   Calcium 9.7 8.9 - 10.3 mg/dL   GFR calc non Af Amer >60 >60 mL/min   GFR calc Af Amer >60 >60 mL/min    Comment: (NOTE) The eGFR has been calculated using the CKD EPI equation. This calculation has not been validated in all clinical situations. eGFR's persistently <60 mL/min signify possible Chronic Kidney Disease.    Anion gap 7 5 - 15    Comment: Performed at [facility], [address] [place], [place] [address]  CBC     Status: Abnormal   Collection Time: 04/19/18  8:44 AM  Result Value Ref Range   WBC 7.4 3.8 - 10.6 K/uL   RBC 5.17 4.40 - 5.90 MIL/uL   Hemoglobin 13.0 13.0 - 18.0 g/dL   HCT 38.9 (L) 40.0 - 52.0 %   MCV 75.1 (L) 80.0 - 100.0 fL   MCH 25.0 (L) 26.0 - 34.0 pg   MCHC 33.3 32.0 - 36.0 g/dL   RDW 14.5 11.5 - 14.5 %   Platelets 180 150 - 440 K/uL    Comment: Performed at [facility], [address] [place], [place] [address]  Protime-INR     Status: None   Collection Time: 04/19/18  8:44 AM  Result Value Ref Range   Prothrombin Time 12.0 11.4 - 15.2 seconds   INR 0.89     Comment: Performed at [facility], [address] [place], [place] [address]  Urinalysis, Routine w reflex microscopic     Status: Abnormal   Collection Time: 04/19/18  8:44 AM  Result Value Ref Range   Color, Urine STRAW (A) YELLOW   APPearance CLEAR (A) CLEAR   Specific Gravity,  Urine 1.017 1.005 - 1.030   pH 5.0 5.0 - 8.0   Glucose, UA NEGATIVE NEGATIVE mg/dL   Hgb urine dipstick NEGATIVE NEGATIVE   Bilirubin Urine NEGATIVE NEGATIVE   Ketones, ur NEGATIVE NEGATIVE mg/dL   Protein, ur NEGATIVE NEGATIVE mg/dL   Nitrite NEGATIVE NEGATIVE   Leukocytes, UA NEGATIVE NEGATIVE    Comment: Performed at [facility], [address], [place], [place] [address]  Urine culture     Status: Abnormal   Collection Time: 04/19/18  8:44 AM  Result Value Ref Range   Specimen Description      URINE, CLEAN CATCH Performed at [facility], [address] [place], [place] [address]  Special Requests      NONE Performed at [facility], [address] [place], [place] [address]    Culture (A)     <10,000 COLONIES/mL INSIGNIFICANT GROWTH Performed at [facility] [address], [place], [place] [address]    Report Status 04/20/2018 FINAL   ABO/Rh     Status: None   Collection Time: 04/26/18 11:24 AM  Result Value Ref Range   ABO/RH(D)      O POS Performed at [facility], [address] [place],  [address]   Surgical pathology     Status: None   Collection Time: 04/26/18  2:47 PM  Result Value Ref Range   SURGICAL PATHOLOGY      Surgical Pathology CASE: [id number] PATIENT: [facility] Surgical Pathology Report     SPECIMEN SUBMITTED: A. Periprosthetic fat B. Pelvic Lymph node, right C. Pelvic Lymph node, left D. Posterior bladder neck margin E. Prostate  CLINICAL HISTORY: None provided  PRE-OPERATIVE DIAGNOSIS: Prostate cancer, umbilical hernia  POST-OPERATIVE DIAGNOSIS: Same as pre op     DIAGNOSIS: A. PERIPROSTHETIC FAT; EXCISION: - ADIPOSE TISSUE, NEGATIVE FOR MALIGNANCY.  B.  PELVIC LYMPH NODE, RIGHT; EXCISION: - SIX LYMPH NODES, NEGATIVE FOR MALIGNANCY (0/6).  C.  PELVIC LYMPH NODE, LEFT; EXCISION: - FOUR LYMPH NODES, NEGATIVE FOR MALIGNANCY (0/4).  D.  POSTERIOR BLADDER  NECK MARGIN; EXCISION: - BENIGN PROSTATIC GLANDS AND MUSCLE WITH THERMAL ARTIFACT. - NEGATIVE FOR MALIGNANCY.  E.  PROSTATE; RADICAL PROSTATECTOMY: - PROSTATIC ADENOCARCINOMA, BILATERAL. - HIGH-GRADE PROSTATIC INTRAEPITHELIAL NEOPLASIA, EXTENSIVE. - SEE CANCER SUMMARY BELOW. - PRIOR BIOPS Y SITE CHANGE. - DETACHED FRAGMENTS OF SEMINAL VESICLE.  CANCER CASE SUMMARY: PROSTATE GLAND Procedure: Radical prostatectomy Histologic Type: Acinar adenocarcinoma Histologic Grade Group and Gleason Score:      Right: Grade group 2 (Gleason score 3+4 = 7)            Left: Grade group 1 (Gleason score 3+3 = 6) Percentage of Pattern 4 in Gleason Score 7 Cancer: <10 % Tumor Quantitation:      Estimated percentage of prostate involved by tumor: Right: 10%; Left: 10 % Extraprostatic Extension (EPE): Not identified Urinary Bladder Neck Invasion: Not identified Seminal Vesicle Invasion: Not identified Lymphovascular Invasion: Not identified Perineural Invasion: Present Margins: Uninvolved by invasive carcinoma Treatment Effect: No known presurgical therapy Regional Lymph Nodes:      Number of Lymph Nodes Involved: 0      Number of Lymph Nodes Examined: 10 Pathologic Stage Classification (pTNM, AJCC 8th Edition): pT2 pN0  GROSS DESCRIPTION: A. Labeled: Periprosthetic  fat Received: In formalin Tissue fragment(s): 2 Size:  aggregate, 2.5 x 2.9 x 0.2cm Description: Yellow lobulated fibrofatty tissue without mass identified Entirely submitted in two cassettes.  B. Labeled: Right pelvic lymph node Received: In formalin Tissue fragment(s): 3 Size: Aggregate, 4.2 x 4.0 x 0.9 cm Description: Yellow lobulated fibrofatty tissue with multiple lymph node candidates, the largest 3.0 x 1.1 x 0.5 cm Block summary 1 - bisected largest lymph node 2 - one bisected possible lymph node 3 - one bisected lymph node 4 - three possible lymph nodes  C. Labeled: Left pelvic lymph node Received: In  formalin Tissue fragment(s): 2 Size: Aggregate, 4.1 x 3.2 x 0.8 cm Description: yellow lobulated fibrofatty tissue with multiple lymph node candidates, Largest, 2.5 x 1.5 x 0.6 cm Block summary 1-2 - bisected largest lymph node candidate 3 - one bisected lymph node 4 - two lymph node candidates  D. Labeled:  Posterior bladder neck margin Received: In  formalin Tissue fragment(s): 1 Size: 0.7 x 0.4 x 0.2 cm Description: Shaggy tan to brown tissue fragments Entirely submitted in one cassette.  E. Labeled: Prostate Received: In formalin Type of procedure: Radical prostatectomy Weight of specimen: 55 grams Size of specimen: 5.1 (medial-lateral) by 4.3 (anterior-posterior) by 4.2 (superior-inferior) cm Orientation / inking: Right - blue Left - black Apex - yellow Bladder neck - orange Seminal vesicles: Right disrupted, 1.0 x 0.7 x 0.3 cm and left 2.8 x 1.5 x 0.8 cm Vasa deferentia: Right-3.1 x 0.5 x 0.5 cm and left-2.4 x 0.5 x 0.5 cm Size(s) of mass(es): Diffuse periurethral nodularity with granularity and an area of hemorrhage in the central left Description of mass(es): Diffuse periurethral nodularity with granularity and an area of hemorrhage in the central left aspect Location of mass(es): Central left hemorrhage and diffuse nodularity/granularity Description of remainder tissue: Also detached in the contai ner are 2 fragments of tissue, aggregate 1.5 x 1.1 x 0.5 cm which are inked green  Block summary: 1 - right apex radially sectioned 2 - right bladder neck radially sectioned 3-11 - representative right prostate from apex towards base respectively (4-5, 6-7, 8-9, 10-11 is one full-thickness section divided) 12 - representative right vas deferens and seminal vesicle at entry to prostate 13 - left apex radially sectioned 14 - left bladder neck radially sectioned 15-23 - representative left prostate from apex towards base respectively (16-17, 18-19,20-21, 22-23 is one  full-thickness section divided) 24 - representative left vas deferens and seminal vesicle at entry to prostate 25 - representative two detached fragments of tissue  Final Diagnosis performed by Quay Burow, MD.   Electronically signed 04/30/2018 12:24:30PM The electronic signature indicates that the named Attending Pathologist has evaluated the specimen  Technical component performed at [facility], [address], [place], [place] [address] Lab: [phone] Dir: Rush Farmer, MD, MMM  Professional component performed at [facility], [facility], [address], [place], [place] [address] Lab: [phone] Dir: Dellia Nims. Rubinas, MD   CBC     Status: Abnormal   Collection Time: 04/27/18  3:41 AM  Result Value Ref Range   WBC 13.0 (H) 3.8 - 10.6 K/uL   RBC 4.84 4.40 - 5.90 MIL/uL   Hemoglobin 11.8 (L) 13.0 - 18.0 g/dL   HCT 36.2 (L) 40.0 - 52.0 %   MCV 74.7 (L) 80.0 - 100.0 fL   MCH 24.4 (L) 26.0 - 34.0 pg   MCHC 32.6 32.0 - 36.0 g/dL   RDW 14.5 11.5 - 14.5 %   Platelets 181 150 - 440 K/uL    Comment: Performed at [facility], [address] [place], [place] [address]  Basic metabolic panel     Status: Abnormal   Collection Time: 04/27/18  3:41 AM  Result Value Ref Range   Sodium 141 135 - 145 mmol/L   Potassium 4.9 3.5 - 5.1 mmol/L   Chloride 108 98 - 111 mmol/L   CO2 25 22 - 32 mmol/L   Glucose, Bld 130 (H) 70 - 99 mg/dL   BUN 18 6 - 20 mg/dL   Creatinine, Ser 1.29 (H) 0.61 - 1.24 mg/dL   Calcium 8.5 (L) 8.9 - 10.3 mg/dL   GFR calc non Af Amer 60 (L) >60 mL/min   GFR calc Af Amer >60 >60 mL/min    Comment: (NOTE) The eGFR has been calculated using the CKD EPI equation. This calculation has not been validated in all  clinical situations. eGFR's persistently <60 mL/min signify possible Chronic Kidney Disease.    Anion gap 8 5 - 15    Comment: Performed at [facility], [place], [place] [address]     PHQ2/9: Depression  screen [facility] 2/9 05/20/2018 02/10/2018 04/22/2017 10/17/2016 04/17/2016  Decreased Interest 0 0 0 0 0  Down, Depressed, Hopeless 0 0 0 0 0  PHQ - 2 Score 0 0 0 0 0  Altered sleeping 0 0 - - -  Tired, decreased energy 0 0 - - -  Change in appetite 0 0 - - -  Feeling bad or failure about yourself  0 0 - - -  Trouble concentrating 0 0 - - -  Moving slowly or fidgety/restless 0 0 - - -  Suicidal thoughts 0 0 - - -  PHQ-9 Score 0 0 - - -  Difficult doing work/chores Not difficult at all - - - -     Fall Risk: Fall Risk  05/20/2018 02/10/2018 04/22/2017 10/17/2016 04/17/2016  Falls in the past year? No No No No No      Assessment & Plan  1. Encounter for routine history and physical exam for male   -Prostate cancer screening and PSA options (with potential risks and benefits of testing vs not testing) were discussed along with recent recs/guidelines. -USPSTF grade A and B recommendations reviewed with patient; age-appropriate recommendations, preventive care, screening tests, etc discussed and encouraged; healthy living encouraged; see AVS for patient education given to patient -Discussed importance of 150 minutes of physical activity weekly, eat two servings of fish weekly, eat one serving of tree nuts ( cashews, pistachios, pecans, almonds.Marland Kitchen) every other day, eat 6 servings of fruit/vegetables daily and drink plenty of water and avoid sweet beverages.

## 2018-05-20 NOTE — Patient Instructions (Signed)

## 2018-05-21 LAB — PSA: Prostate Specific Ag, Serum: 0.1 ng/mL (ref 0.0–4.0)

## 2018-05-25 ENCOUNTER — Ambulatory Visit (INDEPENDENT_AMBULATORY_CARE_PROVIDER_SITE_OTHER): Payer: 59 | Admitting: Urology

## 2018-05-25 ENCOUNTER — Encounter: Payer: Self-pay | Admitting: Urology

## 2018-05-25 VITALS — BP 112/75 | HR 80 | Ht 71.0 in | Wt 230.0 lb

## 2018-05-25 DIAGNOSIS — N393 Stress incontinence (female) (male): Secondary | ICD-10-CM

## 2018-05-25 DIAGNOSIS — N5231 Erectile dysfunction following radical prostatectomy: Secondary | ICD-10-CM

## 2018-05-25 DIAGNOSIS — C61 Malignant neoplasm of prostate: Secondary | ICD-10-CM

## 2018-05-25 MED ORDER — SILDENAFIL CITRATE 20 MG PO TABS: 20.0000 mg | ORAL_TABLET | ORAL | 11 refills | Status: DC | PRN

## 2018-05-25 NOTE — Progress Notes (Signed)
05/25/2018 4:51 PM   [name] [birth year] [id number]  Referring provider: Steele Sizer, MD [address] [place], [place] [address]  Chief Complaint  Patient presents with  . Prostate Cancer    4 wk post op    HPI: 56 male who returns to the office ~1 month following robotic radical prostatectomy with bilateral pelvic lymph node dissection.  He underwent an uncomplicated partial nerve sparing procedure on 04/26/2018.  Surgical pathology showed Gleason 3+4 prostate cancer without evidence of extracapsular extension.  Margins negative.  No seminal vesicle and bladder involvement.  Lymph nodes negative. pT2 N0.  He had a PSA drawn on 05/20/2018 which was 0.1.   Postoperatively, he is doing extremely well.  He is very anxious to get back to normal activity.  He would like to return to work next week.  He is been walking a lot and doing the elliptical without pain.  In terms of continence, he is wearing about 2 pads a day which are variably saturated depending on his activity level.  This is a significant improvement over the past several weeks.  He wears a diaper at night which is essentially dry in the morning.  Overall, he is making progress.  He would like to be referred back to physical therapy for additional exercises.  He has not had any erections and is anxious to pursue penile rehab today.  PMH: Past Medical History:  Diagnosis Date  . Elevated PSA   . Hypertension   . Prostate cancer ([place]) 2019  . Umbilical hernia 2423    Surgical History: Past Surgical History:  Procedure Laterality Date  . COLONOSCOPY    . KNEE SURGERY Right    at age 11; clean out  . PELVIC LYMPH NODE DISSECTION Bilateral 04/26/2018   Procedure: PELVIC LYMPH NODE DISSECTION;  Surgeon: [name], MD;  Location: [facility] ORS;  Service: Urology;  Laterality: Bilateral;  . ROBOT ASSISTED LAPAROSCOPIC RADICAL PROSTATECTOMY N/A 04/26/2018   Procedure: ROBOTIC ASSISTED LAPAROSCOPIC  RADICAL PROSTATECTOMY;  Surgeon: [name], MD;  Location: [facility] ORS;  Service: Urology;  Laterality: N/A;    Home Medications:  Allergies as of 05/25/2018   No Known Allergies     Medication List        Accurate as of 05/25/18  4:51 PM. Always use your most recent med list.          aspirin EC 81 MG tablet Take 81 mg by mouth daily.   MENS 50+ MULTI VITAMIN/MIN PO Take 1 tablet by mouth daily.   pravastatin 40 MG tablet Commonly known as:  PRAVACHOL Take 1 tablet (40 mg total) by mouth daily.   quinapril-hydrochlorothiazide 20-12.5 MG tablet Commonly known as:  ACCURETIC TAKE 1 TABLET BY MOUTH  DAILY   sildenafil 20 MG tablet Commonly known as:  REVATIO Take 1 tablet (20 mg total) by mouth as needed. Take 1-5 tabs as needed prior to intercourse       Allergies: No Known Allergies  Family History: Family History  Problem Relation Age of Onset  . Hypertension Mother   . CAD Mother   . Lung cancer Mother   . Benign prostatic hyperplasia Maternal Uncle   . Hypertension Sister   . Kidney disease Neg Hx   . Prostate cancer Neg Hx   . Bladder Cancer Neg Hx   . Kidney cancer Neg Hx     Social History:  reports that he quit smoking about 35 years ago. His smoking use  included cigarettes. He started smoking about 37 years ago. He quit after 2.00 years of use. He quit smokeless tobacco use about 30 years ago. He reports that he drinks alcohol. He reports that he does not use drugs.  ROS: UROLOGY Frequent Urination?: No Hard to postpone urination?: No Burning/pain with urination?: No Get up at night to urinate?: No Leakage of urine?: Yes Urine stream starts and stops?: No Trouble starting stream?: No Do you have to strain to urinate?: No Blood in urine?: No Urinary tract infection?: No Sexually transmitted disease?: No Injury to kidneys or bladder?: No Painful intercourse?: No Weak stream?: No Erection problems?: No Penile pain?:  No  Gastrointestinal Nausea?: No Vomiting?: No Indigestion/heartburn?: No Diarrhea?: No Constipation?: No  Constitutional Fever: No Night sweats?: No Weight loss?: No Fatigue?: No  Skin Skin rash/lesions?: No Itching?: No  Eyes Blurred vision?: No Double vision?: No  Ears/Nose/Throat Sore throat?: No Sinus problems?: No  Hematologic/Lymphatic Swollen glands?: No Easy bruising?: No  Cardiovascular Leg swelling?: No Chest pain?: No  Respiratory Cough?: No Shortness of breath?: No  Endocrine Excessive thirst?: No  Musculoskeletal Back pain?: No Joint pain?: No  Neurological Headaches?: No Dizziness?: No  Psychologic Depression?: No Anxiety?: No  Physical Exam: BP 112/75   Pulse 80   Ht 5\' 11"  (1.803 m)   Wt 230 lb (104.3 kg)   BMI 32.08 kg/m   Constitutional:  Alert and oriented, No acute distress. HEENT: [place] AT, moist mucus membranes.  Trachea midline, no masses. Cardiovascular: No clubbing, cyanosis, or edema. Respiratory: Normal respiratory effort, no increased work of breathing. GI: Abdomen is soft, nontender, nondistended, no abdominal masses.  Incisions well-healed. Skin: No rashes, bruises or suspicious lesions. Neurologic: Grossly intact, no focal deficits, moving all 4 extremities. Psychiatric: Normal mood and affect.  Laboratory Data: Component     Latest Ref Rng & Units 05/20/2018          Prostate Specific Ag, Serum     0.0 - 4.0 ng/mL 0.1    Pertinent Imaging: NA  Assessment & Plan:    1. Prostate cancer ([place]) pT2 No Gleason 3+4 diease PSA is detectable at 0.1, however this is likely done prematurely I have advised the patient to repeat it in about 2 weeks as a suspect will be undetectable at that point Plan for q. 53-month follow-up with PSAs - PSA; Future  2. Stress incontinence of urine Improving, advised to return to physical therapy as needed Cleared for all pelvic floor exercises Anticipate continued  improvement - Ambulatory referral to Physical Therapy  3. Erectile dysfunction after radical prostatectomy Gust penile rehab today Encourage penile stretching Patient was prescribed sildenafil, advised to use at least one time per week to help promote blood flow We discussed intracavernosal injections today, he will call us if he like to pursue this in the near future   Return for 2 weeks lab only for PSA, 6 months PSA prior.  [name], MD  [facility] [address], [place], [place] [address] [phone]

## 2018-06-10 ENCOUNTER — Other Ambulatory Visit: Payer: 59

## 2018-06-10 DIAGNOSIS — C61 Malignant neoplasm of prostate: Secondary | ICD-10-CM | POA: Diagnosis not present

## 2018-06-11 LAB — PSA

## 2018-06-14 ENCOUNTER — Telehealth: Payer: Self-pay

## 2018-06-14 NOTE — Telephone Encounter (Signed)
-----   Message from [name], MD sent at 06/13/2018  3:23 PM EST ----- Awesome news, your PSA is undetectable.  This is what I anticipated.  I suspect your previous was not all the way down to undetectable levels due to it being drawn slightly prematurely.  We will see you in follow-up as scheduled.  [name], MD

## 2018-07-02 ENCOUNTER — Other Ambulatory Visit: Payer: Self-pay | Admitting: Family Medicine

## 2018-07-02 DIAGNOSIS — E785 Hyperlipidemia, unspecified: Secondary | ICD-10-CM

## 2018-07-03 ENCOUNTER — Other Ambulatory Visit: Payer: Self-pay | Admitting: Family Medicine

## 2018-07-03 DIAGNOSIS — I10 Essential (primary) hypertension: Secondary | ICD-10-CM

## 2018-07-14 ENCOUNTER — Other Ambulatory Visit: Payer: Self-pay

## 2018-07-14 ENCOUNTER — Encounter: Payer: Self-pay | Admitting: Physical Therapy

## 2018-07-14 ENCOUNTER — Ambulatory Visit: Payer: 59 | Attending: Urology | Admitting: Physical Therapy

## 2018-07-14 DIAGNOSIS — R293 Abnormal posture: Secondary | ICD-10-CM | POA: Diagnosis present

## 2018-07-14 DIAGNOSIS — M6281 Muscle weakness (generalized): Secondary | ICD-10-CM

## 2018-07-14 DIAGNOSIS — R279 Unspecified lack of coordination: Secondary | ICD-10-CM | POA: Diagnosis present

## 2018-07-14 NOTE — Patient Instructions (Addendum)
PELVIC FLOOR / KEGEL EXERCISES   Pelvic floor/ Kegel exercises are used to strengthen the muscles in the base of your pelvis that are responsible for supporting your pelvic organs and preventing urine/feces leakage. Based on your therapist's recommendations, they can be performed while standing, sitting, or lying down.  Make yourself aware of this muscle group by using these cues:  Imagine you are in a crowded room and you feel the need to pass gas. Your response is to pull up and in at the rectum.  Close the rectum. Pull the muscles up inside your body,feeling your scrotum lifting as well . Feel the pelvic floor muscles lift as if you were walking into a cold lake.  Place your hand on top of your pubic bone. Tighten and draw in the muscles around the anal muscles without squeezing the buttock muscles.  Common Errors:  Breath holding: If you are holding your breath, you may be bearing down against your bladder instead of pulling it up. If you belly bulges up while you are squeezing, you are holding your breath. Be sure to breathe gently in and out while exercising. Counting out loud may help you avoid holding your breath.  Accessory muscle use: You should not see or feel other muscle movement when performing pelvic floor exercises. When done properly, no one can tell that you are performing the exercises. Keep the buttocks, belly and inner thighs relaxed.  Overdoing it: Your muscles can fatigue and stop working for you if you over-exercise. You may actually leak more or feel soreness at the lower abdomen or rectum.   SHORT HOLDS: Position: on back, sitting   Inhale and then exhale. Then squeeze the muscle.  (Be sure to let belly sink in with exhales and not push outward)  Perform 7 repetitions, 5  Times/day  **ALSO SQUEEZE BEFORE YOUR SNEEZE, COUGH, LAUGH to decrease downward pressure   **ALSO EXHALE BEFORE YOU RISE AGAINST GRAVITY (lifting, sit to stand, from squat to stand)   ______   To account for the R low back :   -seated L side bends with R arm overhead 10 reps      band under ballmounds  while laying on back w/ knees bent  Half "W" exercise  10 reps x 2 sets   On R hand only   Band is placed under L  foot, knees bent, feet are hip width apart  Hold band with thumbs point out, keep upper arm and elbow touching the bed the whole time  - inhale and then exhale pull bands by bending elbows hands move in a half "w"  (feel shoulder blades squeezing)    __________________  Multifidis twist  To Left only   Band is on doorknob: stand further away from door (facing perpendicular)  With the R side by door   Twisting trunk without moving the hips and knees Hold band at the level of ribcage, elbows bent,shoulder blades roll back and down like squeezing a pencil under armpit    Exhale twist to the Left only ,.10-15 deg away from door without moving your hips/ knees. Continue to maintain equal weight through legs. Keep knee unlocked.  10 x 2    ___________________  Right side. One sided push ups at counter  ( squeeze shoudler down and back, push through 4 corners of the palm, elbowmoves towards back rib )  15 reps   ___________________  Get someone to take a profile pic of your work station

## 2018-07-15 NOTE — Therapy (Addendum)
[place] MAIN [facility] SERVICES [address] [place], [place], [address] Phone: [phone]   Fax:  [phone]  Physical Therapy Evaluation  Patient Details  Name: Jeffrey Whitaker MRN: [id number] Date of Birth: Apr 30, 1961 Referring Provider (PT): [name] MD   Encounter Date: 07/14/2018    Past Medical History:  Diagnosis Date  . Elevated PSA   . Hypertension   . Prostate cancer ([place]) 2019  . Umbilical hernia 4081    Past Surgical History:  Procedure Laterality Date  . COLONOSCOPY    . KNEE SURGERY Right    at age 58; clean out  . PELVIC LYMPH NODE DISSECTION Bilateral 04/26/2018   Procedure: PELVIC LYMPH NODE DISSECTION;  Surgeon: [name], MD;  Location: [facility] ORS;  Service: Urology;  Laterality: Bilateral;  . ROBOT ASSISTED LAPAROSCOPIC RADICAL PROSTATECTOMY N/A 04/26/2018   Procedure: ROBOTIC ASSISTED LAPAROSCOPIC RADICAL PROSTATECTOMY;  Surgeon: [name], MD;  Location: [facility] ORS;  Service: Urology;  Laterality: N/A;    There were no vitals filed for this visit.   Subjective Assessment - 07/15/18 2145    Subjective  1) SUI:  Pt reports his prostate surgery 04/26/18 went very well. The bladder control and slight leakages are the problems now. Leakage does not occur at night and more gravity driven basis and sounds of running water. No problem with initiating urination. Stream is good and strong.  He stops midstream to test pelvic floor control.  Pt uses a urinary pad 1 in the morning and afternoon.  The amoutn of leakage is not alot but it is an annoying amount ( he estimates 10 mL). Pt is back at the gym and does not have leakage when working. Pt does elliptical, weight lifting wtih dumbbells ( 25lbs) , RDLs (  40 lbs),  farmer walks ( 35 lb), lat pull down (45 lbs) .  2  days a week with weight lifting with trainer. 2 x week with circuit training. Pt has returned to this routine 6 weeks ago surgery.  Pt works with 2  trainers, one trainer works with more core/ strength/ stretching, and other trainer works more for cardio and strength.    2) urgency with hearing sounds of running water and urge incontinence occurs 15-20% of the time.         Pertinent History  Denied LBP, injuries to knees, ankle, feet.  R knee surgery at the age 16-32 year old.     Patient Stated Goals  get back into the routine with exercises after surgery         [facility] PT Assessment - 07/15/18 2121      Assessment   Medical Diagnosis  s/p prostatectomy       Precautions   Precautions  None      Restrictions   Weight Bearing Restrictions  No      Balance Screen   Has the patient fallen in the past 6 months  No      [place] residence      Posture/Postural Control   Posture Comments  lumbopelvic perturbations with L hip ext MMT, decreased activation on R QL       AROM   Overall AROM   Other (comment)   WFL in all directions   Overall AROM Comments  hooklying: R knee flexion, 115 deg, 55 deg L       Strength   Overall Strength  --   hip abd  L 5/5, R hip abd 4-/5     Palpation   Spinal mobility  L > R  tightness along back compared to pre surgery, R QL atrophied     pt sits collapsed on R side, R knee limited in flexion               Objective measurements completed on examination: See above findings.    Pelvic Floor Special Questions - 07/15/18 2124    Diastasis Recti  slight separation with 3 fingers depth below sternum,     External Perineal Exam  through clothing, limited pelvic floor cranial movement with cue for contraction        [facility] Adult PT Treatment/Exercise - 07/15/18 2121      Neuro Re-ed    Neuro Re-ed Details   see pt instructions      Manual Therapy   Manual therapy comments  STM at intercostals, abdominal pulling                  PT Long Term Goals - 07/15/18 2114      PT LONG TERM GOAL #1   Title  Pt will decrease his IPSS  score from 6 pts ( mild)  to < 3 pts and QOL score 2 pts to 0 pts in order to improve pelvic function    Time  12    Period  Weeks    Status  New    Target Date  10/07/18      PT LONG TERM GOAL #2   Title  Pt will demo decrease his abdominal separation from 3 fingers below sternum to < 2 fingers in order to improve intraabdominal pressure for improve pelvic floor function    Time  10    Period  Weeks    Status  New    Target Date  09/23/18      PT LONG TERM GOAL #3   Title  Pt will increase R QL mm mass and strengthen and demo no lumbopelvic perturbations with L hip ext MMT  in order to minimize imbalances 2/2 R knee limitation and minimize risks for injuries with fitness routines.      Time  6    Period  Weeks    Status  New    Target Date  08/26/18      PT LONG TERM GOAL #4   Title  Pt will demo proper alignment and technique with weight lifting, body weight training and fitness routine to minimzie risk for worsening of incontinence    Time  10    Period  Weeks    Status  New    Target Date  09/23/18      PT LONG TERM GOAL #5   Title  Pt will decrease pad wear from 2 to 0 in order to  improve QOL     Time  6    Period  Weeks    Status  New    Target Date  08/26/18           Plan - 07/15/18 2114    Clinical Impression Statement  Pt is 57 yo male who reports mixed incontinence ( SUI and urge) s/p prostatectomy. Pt's clinical presentations include posterior back mm and pelvic floor increased tensions imbalance 2/2 limited R knee flexion and poor intraabdominal pressure system 2/2 the presence of diastasis recti. Pt also has been working with two fitness trainer and will benefit from more education and practice to co-activate deep  core and lower kinetic chain to minimize incontinence and overactivity of pelvic floor mm.        Rehab Potential  Good    PT Frequency  1x / week    PT Duration  12 weeks    PT Treatment/Interventions  Balance training;Therapeutic  exercise;Neuromuscular re-education;Stair training;Gait training;Moist Heat;Patient/family education;Therapeutic activities;Functional mobility training;Manual techniques;Biofeedback;Dry needling;Scar mobilization;Taping;Electrical Stimulation;Energy conservation    Consulted and Agree with Plan of Care  Patient       Patient will benefit from skilled therapeutic intervention in order to improve the following deficits and impairments:  Improper body mechanics, Increased muscle spasms, Postural dysfunction, Decreased coordination, Decreased mobility, Decreased range of motion, Decreased endurance, Difficulty walking, Decreased strength, Abnormal gait  Visit Diagnosis: Muscle weakness (generalized)  Abnormal posture  Unspecified lack of coordination     Problem List Patient Active Problem List   Diagnosis Date Noted  . Prostate cancer ([place]) 04/26/2018  . Allergic rhinitis, seasonal 04/15/2015  . Abnormal blood sugar 04/11/2015  . Benign hypertension 04/11/2015  . Dyslipidemia 04/11/2015  . Obesity (BMI 30.0-34.9) 04/11/2015  . Right medial knee pain 04/11/2015  . Metabolic syndrome 08/81/1031  . Left shoulder pain 04/11/2015  . Umbilical hernia 59/45/8592  . Microcytosis 04/11/2015    Jerl Mina  ,PT, DPT, E-RYT 07/15/2018, 9:45 PM  [place] MAIN [facility] SERVICES [address] [place], [place], [address] Phone: [phone]   Fax:  [phone]  Name: Jeffrey Whitaker MRN: [id number] Date of Birth: 11-Jun-1961

## 2018-07-19 NOTE — Addendum Note (Signed)
Addended by: Jerl Mina on: 07/19/2018 09:07 AM   Modules accepted: Orders

## 2018-07-22 ENCOUNTER — Ambulatory Visit: Payer: 59 | Admitting: Physical Therapy

## 2018-07-22 DIAGNOSIS — R293 Abnormal posture: Secondary | ICD-10-CM

## 2018-07-22 DIAGNOSIS — M6281 Muscle weakness (generalized): Secondary | ICD-10-CM

## 2018-07-22 DIAGNOSIS — R279 Unspecified lack of coordination: Secondary | ICD-10-CM

## 2018-07-22 NOTE — Therapy (Signed)
[place] MAIN [facility] SERVICES [address] [place], [place], [address] Phone: [phone]   Fax:  [phone]  Physical Therapy Treatment  Patient Details  Name: Jeffrey Whitaker MRN: [id number] Date of Birth: 1960-08-25 Referring Provider (PT): [name] MD   Encounter Date: 07/22/2018  PT End of Session - 07/22/18 1114    Visit Number  2    Number of Visits  12    Date for PT Re-Evaluation  10/06/18    PT Start Time  1010    PT Stop Time  1100    PT Time Calculation (min)  50 min    Activity Tolerance  Patient tolerated treatment well    Behavior During Therapy  [facility] for tasks assessed/performed       Past Medical History:  Diagnosis Date  . Elevated PSA   . Hypertension   . Prostate cancer ([place]) 2019  . Umbilical hernia 2831    Past Surgical History:  Procedure Laterality Date  . COLONOSCOPY    . KNEE SURGERY Right    at age 57; clean out  . PELVIC LYMPH NODE DISSECTION Bilateral 04/26/2018   Procedure: PELVIC LYMPH NODE DISSECTION;  Surgeon: [name], MD;  Location: [facility] ORS;  Service: Urology;  Laterality: Bilateral;  . ROBOT ASSISTED LAPAROSCOPIC RADICAL PROSTATECTOMY N/A 04/26/2018   Procedure: ROBOTIC ASSISTED LAPAROSCOPIC RADICAL PROSTATECTOMY;  Surgeon: [name], MD;  Location: [facility] ORS;  Service: Urology;  Laterality: N/A;    There were no vitals filed for this visit.  Subjective Assessment - 07/22/18 1009    Subjective  Pt had no increased pain with new HEP     Pertinent History  Denied LBP, injuries to knees, ankle, feet.  R knee surgery at the age 57-19 year old.     Patient Stated Goals  get back into the routine with exercises after surgery         [facility] PT Assessment - 07/22/18 1047      Palpation   Spinal mobility  R QL increased mm bulk, upper throacic/ lower cervical and upper trap with B tightness                 Pelvic Floor Special Questions - 07/22/18 1047    Diastasis  Recti  1 finger width below sternum         [facility] Adult PT Treatment/Exercise - 07/22/18 1048      Neuro Re-ed    Neuro Re-ed Details   see pt instructions      Moist Heat Therapy   Number Minutes Moist Heat  5 Minutes    Moist Heat Location  Cervical;Other (comment)      Manual Therapy   Manual Therapy  --   thoracic    Manual therapy comments  GRade III mob PA along thoracic, STM/ MWM paraspinal/ medial to scap ,                PT Short Term Goals - 04/05/18 2001      PT SHORT TERM GOAL #1   Title  Pt wil demo proper body mechanics to minimize straining of abdomen and pelvic floor ( lifting dumbbells, kettlebells, stand<> floor and sit to stand t/f and lifting 40 lb back bag from ground to shoulders)     Time  4    Period  Weeks    Status  Achieved      PT SHORT TERM GOAL #2   Title  Pt will  demo proper deep core coordination with proper diaphragmatic excursion in order to optimize postural stability and pelvic floor function    Time  2    Period  Weeks    Status  Achieved      PT SHORT TERM GOAL #3   Title  Pt will demo IND with flexibility HEP w/ modificaitons to R knee limited ROM to utilize during 77 miles hiking trip in order to maintain proper pelvic floor ROM and minimize overactivity of lower kinetic chain      Time  4    Period  Weeks    Status  Achieved        PT Long Term Goals - 07/15/18 2114      PT LONG TERM GOAL #1   Title  Pt will decrease his IPSS score from 6 pts ( mild)  to < 3 pts and QOL score 2 pts to 0 pts in order to improve pelvic function    Time  12    Period  Weeks    Status  New    Target Date  10/07/18      PT LONG TERM GOAL #2   Title  Pt will demo decrease his abdominal separation from 3 fingers below sternum to < 2 fingers in order to improve intraabdominal pressure for improve pelvic floor function    Time  10    Period  Weeks    Status  New    Target Date  09/23/18      PT LONG TERM GOAL #3   Title  Pt will  increase R QL mm mass and strengthen and demo no lumbopelvic perturbations with L hip ext MMT  in order to minimize imbalances 2/2 R knee limitation and minimize risks for injuries with fitness routines.      Time  6    Period  Weeks    Status  New    Target Date  08/26/18      PT LONG TERM GOAL #4   Title  Pt will demo proper alignment and technique with weight lifting, body weight training and fitness routine to minimzie risk for worsening of incontinence    Time  10    Period  Weeks    Status  New    Target Date  09/23/18      PT LONG TERM GOAL #5   Title  Pt will decrease pad wear from 2 to 0 in order to  improve QOL     Time  6    Period  Weeks    Status  New    Target Date  08/26/18            Plan - 07/22/18 1327    Clinical Impression Statement  Pt demo'd improved R QL mm bulk with less lumbopelvic perturbation compared to last session. Pt to perform multifidis bilaterally this week to continue improving posterior core mm to account for limited R knee flexion.  Addressed upper neck/shoulder/ throacic thightness to optimize posterior/ lateral excursion of diaphragm which in turn will also continue improving DRA.  Pt was provided more education about creting a well-rounded workout routine to minimize overuse of mm.  Pt continues to benefit from skilled PT.     Rehab Potential  Good    PT Frequency  1x / week    PT Duration  12 weeks    PT Treatment/Interventions  Balance training;Therapeutic exercise;Neuromuscular re-education;Stair training;Gait training;Moist Heat;Patient/family education;Therapeutic activities;Functional mobility training;Manual techniques;Biofeedback;Dry needling;Scar mobilization;Taping;Electrical  Stimulation;Energy conservation    Consulted and Agree with Plan of Care  Patient       Patient will benefit from skilled therapeutic intervention in order to improve the following deficits and impairments:  Improper body mechanics, Increased muscle spasms,  Postural dysfunction, Decreased coordination, Decreased mobility, Decreased range of motion, Decreased endurance, Difficulty walking, Decreased strength, Abnormal gait  Visit Diagnosis: Muscle weakness (generalized)  Abnormal posture  Unspecified lack of coordination     Problem List Patient Active Problem List   Diagnosis Date Noted  . Prostate cancer ([place]) 04/26/2018  . Allergic rhinitis, seasonal 04/15/2015  . Abnormal blood sugar 04/11/2015  . Benign hypertension 04/11/2015  . Dyslipidemia 04/11/2015  . Obesity (BMI 30.0-34.9) 04/11/2015  . Right medial knee pain 04/11/2015  . Metabolic syndrome 46/21/9471  . Left shoulder pain 04/11/2015  . Umbilical hernia 25/27/1292  . Microcytosis 04/11/2015    Jerl Mina ,PT, DPT, E-RYT  07/22/2018, 1:31 PM  [place] MAIN [facility] SERVICES [address] [place], [place], [address] Phone: [phone]   Fax:  [phone]  Name: AKEEL REFFNER MRN: [id number] Date of Birth: [birth year]

## 2018-07-22 NOTE — Patient Instructions (Addendum)
Morning and evening stretches:  open book  15 reps   cat cow , press tops of feet at bottom edge of your matress, hands shoulder width apart,   10 reps    At work:  1) "bear by a tree" shoulder blade stretch at doorway ( mini squat position       10 reps   2) " wall clock stretch for the pect:       5 breaths  3) Armpit squeeze -wall press  All 4 corners of palms down Shoulders down and away from ears  10 reps    _____  Multifidis on B sides

## 2018-07-29 ENCOUNTER — Ambulatory Visit: Payer: 59 | Attending: Urology | Admitting: Physical Therapy

## 2018-07-29 DIAGNOSIS — M6281 Muscle weakness (generalized): Secondary | ICD-10-CM | POA: Diagnosis not present

## 2018-07-29 DIAGNOSIS — R279 Unspecified lack of coordination: Secondary | ICD-10-CM | POA: Insufficient documentation

## 2018-07-29 DIAGNOSIS — R293 Abnormal posture: Secondary | ICD-10-CM | POA: Diagnosis present

## 2018-07-29 NOTE — Patient Instructions (Addendum)
Major principles:  _Shoulders down and back, elbows by side, more balanced weight into lower body, center of mass shifted back, in planks, push ups  _Balance out with shoulders down and back, external rotation of shoulders when doing a lot of pectoralis muscle shortening ( lat pulls or flys with slight bend in elbows to not overuse upper traps)    [place]chop:  Stand in lunge at 45 deg turn, shift more weight into outer foot as you turn away from cable column to minimize knee from turning in .   Box hop: use a lower step for both legs. Lower slowly other foot, maintaining pelvic alignment to the front   No weights on R SL   No head lift in bicycle crunches to minimize strain on abdomen and pelvic floor

## 2018-07-29 NOTE — Therapy (Signed)
[place] MAIN [facility] SERVICES [address] [place], [place], [address] Phone: [phone]   Fax:  [phone]  Physical Therapy Treatment  Patient Details  Name: Jeffrey Whitaker MRN: [id number] Date of Birth: Dec 15, 1960 Referring Provider (PT): [name] MD   Encounter Date: 07/29/2018  PT End of Session - 07/29/18 1243    Visit Number  3    Number of Visits  12    Date for PT Re-Evaluation  10/06/18    PT Start Time  0910    PT Stop Time  1010    PT Time Calculation (min)  60 min    Activity Tolerance  Patient tolerated treatment well    Behavior During Therapy  [facility] for tasks assessed/performed       Past Medical History:  Diagnosis Date  . Elevated PSA   . Hypertension   . Prostate cancer ([place]) 2019  . Umbilical hernia 0867    Past Surgical History:  Procedure Laterality Date  . COLONOSCOPY    . KNEE SURGERY Right    at age 26; clean out  . PELVIC LYMPH NODE DISSECTION Bilateral 04/26/2018   Procedure: PELVIC LYMPH NODE DISSECTION;  Surgeon: [name], MD;  Location: [facility] ORS;  Service: Urology;  Laterality: Bilateral;  . ROBOT ASSISTED LAPAROSCOPIC RADICAL PROSTATECTOMY N/A 04/26/2018   Procedure: ROBOTIC ASSISTED LAPAROSCOPIC RADICAL PROSTATECTOMY;  Surgeon: [name], MD;  Location: [facility] ORS;  Service: Urology;  Laterality: N/A;    There were no vitals filed for this visit.  Subjective Assessment - 07/29/18 1256    Subjective  Pt reports leakage is less    Pertinent History  Denied LBP, injuries to knees, ankle, feet.  R knee surgery at the age 44-2 year old.     Patient Stated Goals  get back into the routine with exercises after surgery         [facility] PT Assessment - 07/29/18 1029      Observation/Other Assessments   Observations  reviewed fitness routine prescribed by trainer: all exercises were dominated with pectoralis shortening, pt demo'd poor scapular stabilization, propioception, poor balance  on R SLS, genu valgus on R with cross-diagonal patterns ( improved technique and alignment post Tx)        Strength   Overall Strength  --   PF ( single UE on wall), R Grade 3/5 5 reps                   [facility] Adult PT Treatment/Exercise - 07/29/18 1032      Neuro Re-ed    Neuro Re-ed Details   see pt instructions              PT Long Term Goals - 07/29/18 1322      PT LONG TERM GOAL #1   Title  Pt will decrease his IPSS score from 6 pts ( mild)  to < 3 pts and QOL score 2 pts to 0 pts in order to improve pelvic function    Time  12    Period  Weeks    Status  On-going      PT LONG TERM GOAL #2   Title  Pt will demo decrease his abdominal separation from 3 fingers below sternum to < 2 fingers in order to improve intraabdominal pressure for improve pelvic floor function    Time  10    Period  Weeks    Status  On-going  PT LONG TERM GOAL #3   Title  Pt will increase R QL mm mass and strengthen and demo no lumbopelvic perturbations with L hip ext MMT  in order to minimize imbalances 2/2 R knee limitation and minimize risks for injuries with fitness routines.      Time  6    Period  Weeks    Status  On-going      PT LONG TERM GOAL #4   Title  Pt will demo proper alignment and technique with weight lifting, body weight training and fitness routine to minimzie risk for worsening of incontinence    Time  10    Period  Weeks    Status  On-going      PT LONG TERM GOAL #5   Title  Pt will decrease pad wear from 2 to 0 in order to  improve QOL     Time  6    Period  Weeks    Status  On-going      Additional Long Term Goals   Additional Long Term Goals  Yes      PT LONG TERM GOAL #6   Title  Pt will demo improved R PF from 5 reps Grade 3/5 to Grade 4/5 > 10 reps and improved SLS on R with more stability for 30 sec in order to perform fitness exercises and minimize risks for falls     Time  8    Period  Weeks    Status  New    Target Date  09/23/18             Plan - 07/29/18 1243    Clinical Impression Statement  Pt is progressing well with less leakage. Pt required excessive cues to improve neuromuscular control with deep core / thoracolumbar/ RTC muscles in the exercises he is performing with fitness trainer. Due to pt's knee limitations, pt demo'd poor standing balance and PF strength on R and poor lower kinetic chain proprioception.  Modified his fitness trainer exercises to minimize risk for injuries 2/2 his RLE deficits. Plan to improve better balance on R LE at upcoming sessions.  Pt continues to benefit from skilled PT.    Rehab Potential  Good    PT Frequency  1x / week    PT Duration  12 weeks    PT Treatment/Interventions  Balance training;Therapeutic exercise;Neuromuscular re-education;Stair training;Gait training;Moist Heat;Patient/family education;Therapeutic activities;Functional mobility training;Manual techniques;Biofeedback;Dry needling;Scar mobilization;Taping;Electrical Stimulation;Energy conservation    Consulted and Agree with Plan of Care  Patient       Patient will benefit from skilled therapeutic intervention in order to improve the following deficits and impairments:  Improper body mechanics, Increased muscle spasms, Postural dysfunction, Decreased coordination, Decreased mobility, Decreased range of motion, Decreased endurance, Difficulty walking, Decreased strength, Abnormal gait  Visit Diagnosis: Muscle weakness (generalized)  Abnormal posture  Unspecified lack of coordination     Problem List Patient Active Problem List   Diagnosis Date Noted  . Prostate cancer ([place]) 04/26/2018  . Allergic rhinitis, seasonal 04/15/2015  . Abnormal blood sugar 04/11/2015  . Benign hypertension 04/11/2015  . Dyslipidemia 04/11/2015  . Obesity (BMI 30.0-34.9) 04/11/2015  . Right medial knee pain 04/11/2015  . Metabolic syndrome 36/14/4315  . Left shoulder pain 04/11/2015  . Umbilical hernia 40/02/6760  .  Microcytosis 04/11/2015    Jerl Mina ,PT, DPT, E-RYT  07/29/2018, 1:25 PM  [place] MAIN [facility] SERVICES [address] [place], [place], [address] Phone: [phone]  Fax:  [phone]  Name: RAKIM MOONE MRN: [id number] Date of Birth: 1960-10-18

## 2018-07-31 ENCOUNTER — Encounter: Payer: Self-pay | Admitting: Urology

## 2018-08-03 ENCOUNTER — Telehealth: Payer: Self-pay | Admitting: Family Medicine

## 2018-08-03 MED ORDER — NONFORMULARY OR COMPOUNDED ITEM: 0 refills | Status: DC

## 2018-08-03 NOTE — Telephone Encounter (Signed)
Patient notified that we need to set up a appointment with [facility] for Trimix injection training. Trimix has been sent to pharmacy. 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

## 2018-08-04 ENCOUNTER — Ambulatory Visit: Payer: 59 | Admitting: Physical Therapy

## 2018-08-04 DIAGNOSIS — R279 Unspecified lack of coordination: Secondary | ICD-10-CM

## 2018-08-04 DIAGNOSIS — M6281 Muscle weakness (generalized): Secondary | ICD-10-CM

## 2018-08-04 DIAGNOSIS — R293 Abnormal posture: Secondary | ICD-10-CM

## 2018-08-04 NOTE — Patient Instructions (Signed)
Feet muscles strengthening     Gas pedal pump with band under ballmound 30 reps B   Lower ballmound first, heel slow 30 reps    Big toe in  ( other foot on band, thigh crossed over shin)  - attach band to low banister  Ballmound spread  (Inversion )  30 reps  ---------  Hold off on heel raises standing  _______  Sit to stand with hands on chair , focus on more weight on pinky toe side

## 2018-08-04 NOTE — Therapy (Addendum)
[place] MAIN [facility] SERVICES [address] [place], [place], [address] Phone: [phone]   Fax:  [phone]  Physical Therapy Treatment  Patient Details  Name: Jeffrey Whitaker MRN: [id number] Date of Birth: Jan 08, 1961 Referring Provider (PT): [name] MD   Encounter Date: 08/04/2018  PT End of Session - 08/04/18 1054    Visit Number  4    Number of Visits  12    Date for PT Re-Evaluation  10/06/18    PT Start Time  1005    PT Stop Time  1054    PT Time Calculation (min)  49 min    Activity Tolerance  Patient tolerated treatment well    Behavior During Therapy  [facility] for tasks assessed/performed       Past Medical History:  Diagnosis Date  . Elevated PSA   . Hypertension   . Prostate cancer ([place]) 2019  . Umbilical hernia 3151    Past Surgical History:  Procedure Laterality Date  . COLONOSCOPY    . KNEE SURGERY Right    at age 35; clean out  . PELVIC LYMPH NODE DISSECTION Bilateral 04/26/2018   Procedure: PELVIC LYMPH NODE DISSECTION;  Surgeon: [name], MD;  Location: [facility] ORS;  Service: Urology;  Laterality: Bilateral;  . ROBOT ASSISTED LAPAROSCOPIC RADICAL PROSTATECTOMY N/A 04/26/2018   Procedure: ROBOTIC ASSISTED LAPAROSCOPIC RADICAL PROSTATECTOMY;  Surgeon: [name], MD;  Location: [facility] ORS;  Service: Urology;  Laterality: N/A;    There were no vitals filed for this visit.  Subjective Assessment - 08/04/18 1010    Subjective  Pt reported the minor adjustments from last session made a big difference in his weight lifting routine. Pt noticed soreness in the lower shoulder blade area and his upper trap muscles are more relaxed. Pt did not do his heel raises on teh R foot because he felt pain in the inside part of his ankle     Pertinent History  Denied LBP, injuries to knees, ankle, feet.  R knee surgery at the age 32-23 year old.     Patient Stated Goals  get back into the routine with exercises after surgery          [facility] PT Assessment - 08/04/18 1030      Observation/Other Assessments   Observations  medial collapse of R foot R > L (everted STJ)  in sit to stand, gait  ( improved with cuing to supinate/ invert n R)                    [facility] Adult PT Treatment/Exercise - 08/04/18 1031      Neuro Re-ed    Neuro Re-ed Details   see pt instructions, tactuile. verbal cues for arch lift, more supination/inversion to correct everted / hypermobile/ unstable position in loaded position     Exercises   Exercises  --   see pt instructions     Manual Therapy   Manual therapy comments  distraction at talocrural, medial glide at cuneiform    GradeIII mob   Kinesiotex  --   to promote supination on B feet              PT Short Term Goals - 04/05/18 2001      PT SHORT TERM GOAL #1   Title  Pt wil demo proper body mechanics to minimize straining of abdomen and pelvic floor ( lifting dumbbells, kettlebells, stand<> floor and sit to stand t/f and lifting 40  lb back bag from ground to shoulders)     Time  4    Period  Weeks    Status  Achieved      PT SHORT TERM GOAL #2   Title  Pt will demo proper deep core coordination with proper diaphragmatic excursion in order to optimize postural stability and pelvic floor function    Time  2    Period  Weeks    Status  Achieved      PT SHORT TERM GOAL #3   Title  Pt will demo IND with flexibility HEP w/ modificaitons to R knee limited ROM to utilize during 77 miles hiking trip in order to maintain proper pelvic floor ROM and minimize overactivity of lower kinetic chain      Time  4    Period  Weeks    Status  Achieved        PT Long Term Goals - 07/29/18 1322      PT LONG TERM GOAL #1   Title  Pt will decrease his IPSS score from 6 pts ( mild)  to < 3 pts and QOL score 2 pts to 0 pts in order to improve pelvic function    Time  12    Period  Weeks    Status  On-going      PT LONG TERM GOAL #2   Title  Pt will demo  decrease his abdominal separation from 3 fingers below sternum to < 2 fingers in order to improve intraabdominal pressure for improve pelvic floor function    Time  10    Period  Weeks    Status  On-going      PT LONG TERM GOAL #3   Title  Pt will increase R QL mm mass and strengthen and demo no lumbopelvic perturbations with L hip ext MMT  in order to minimize imbalances 2/2 R knee limitation and minimize risks for injuries with fitness routines.      Time  6    Period  Weeks    Status  On-going      PT LONG TERM GOAL #4   Title  Pt will demo proper alignment and technique with weight lifting, body weight training and fitness routine to minimzie risk for worsening of incontinence    Time  10    Period  Weeks    Status  On-going      PT LONG TERM GOAL #5   Title  Pt will decrease pad wear from 2 to 0 in order to  improve QOL     Time  6    Period  Weeks    Status  On-going      Additional Long Term Goals   Additional Long Term Goals  Yes      PT LONG TERM GOAL #6   Title  Pt will demo improved R PF from 5 reps Grade 3/5 to Grade 4/5 > 10 reps and improved SLS on R with more stability for 30 sec in order to perform fitness exercises and minimize risks for falls     Time  8    Period  Weeks    Status  New    Target Date  09/23/18            Plan - 08/04/18 1101    Clinical Impression Statement  Addressed pt's complaint of medial ankle pain with SLS in HEP. Withheld this exercise because pt showed everted at STJ and medial collapse  of longitudinal arch in both ankle in sit to stand and gait.  R ankle eversion at [place] with pronation 2/2 R knee flexion which occurs in all positions, loaded and unloaded activities. Applied skilled PT to account for his limitation, applying manual Tx/ taping to promote more stable foot with supination/inversion, HEP to strengthen arch in seated position, and modified sit to stand with use of BUE to minimize loading at ankle joint into pronation.   Withheld SLS and other standing exercises until pt demonstrates increased plantar arch lift and improved intrinsic mm co-activation. Anticipate these improvements will help pt achieve his goal to perform SLS and balance exercises on RLE with less risk of injuries and difficulty. Pt's leakage continues to improve. Added education to ensure 2 breaths to lengthen pelvic floor in between 4-5 sec pelvic floor endurance holds. Pt reports the thoracolumbar and scapular stabilization techniques from last session has helped his weight lifting routine and his upper trap remained more relaxed. Keeping his upper trap relaxed will continue to promote optimal pelvic floor and diaphramgatic co-activation for better core and pelvic floor function.  Pt continues to benefit from skilled PT.      Rehab Potential  Good    PT Frequency  1x / week    PT Duration  12 weeks    PT Treatment/Interventions  Balance training;Therapeutic exercise;Neuromuscular re-education;Stair training;Gait training;Moist Heat;Patient/family education;Therapeutic activities;Functional mobility training;Manual techniques;Biofeedback;Dry needling;Scar mobilization;Taping;Electrical Stimulation;Energy conservation    Consulted and Agree with Plan of Care  Patient       Patient will benefit from skilled therapeutic intervention in order to improve the following deficits and impairments:  Improper body mechanics, Increased muscle spasms, Postural dysfunction, Decreased coordination, Decreased mobility, Decreased range of motion, Decreased endurance, Difficulty walking, Decreased strength, Abnormal gait  Visit Diagnosis: Muscle weakness (generalized)  Abnormal posture  Unspecified lack of coordination     Problem List Patient Active Problem List   Diagnosis Date Noted  . Prostate cancer ([place]) 04/26/2018  . Allergic rhinitis, seasonal 04/15/2015  . Abnormal blood sugar 04/11/2015  . Benign hypertension 04/11/2015  . Dyslipidemia  04/11/2015  . Obesity (BMI 30.0-34.9) 04/11/2015  . Right medial knee pain 04/11/2015  . Metabolic syndrome 01/18/7627  . Left shoulder pain 04/11/2015  . Umbilical hernia 31/51/7616  . Microcytosis 04/11/2015    Jerl Mina  ,PT, DPT, E-RYT  08/04/2018, 11:10 AM  [place] MAIN [facility] SERVICES [address] [place], [place], [address] Phone: [phone]   Fax:  [phone]  Name: Jeffrey Whitaker MRN: [id number] Date of Birth: 1961-02-09

## 2018-08-11 ENCOUNTER — Ambulatory Visit: Payer: 59 | Admitting: Physical Therapy

## 2018-08-11 DIAGNOSIS — R293 Abnormal posture: Secondary | ICD-10-CM

## 2018-08-11 DIAGNOSIS — R279 Unspecified lack of coordination: Secondary | ICD-10-CM

## 2018-08-11 DIAGNOSIS — M6281 Muscle weakness (generalized): Secondary | ICD-10-CM

## 2018-08-11 NOTE — Patient Instructions (Addendum)
Variate the technique with sit to stand Use hands every time  Sometimes, toes up, foot up, pressure on heel and then lower foot with more weight on the R edge   Sometimes, whole foot down with  then lower foot with more weight on the R edge    ____  Spread toes and more weight on lateral edge of R foot in squats, not as low in squats  NO WEIGHTS WITH SQUATS  ____   isometric ( gas pedal release, heel down,ballmounds down, toes spread against yoga block/wall60 deg  press down at these points, hands push down on chair   30 reps each foot  ____ Walking  "dont curl toes"

## 2018-08-13 NOTE — Therapy (Signed)
[place] MAIN [facility] SERVICES [address] [place], [place], [address] Phone: [phone]   Fax:  [phone]  Physical Therapy Treatment  Patient Details  Name: Jeffrey Whitaker MRN: [id number] Date of Birth: 05/28/1961 Referring Provider (PT): [name] MD   Encounter Date: 08/11/2018    Past Medical History:  Diagnosis Date  . Elevated PSA   . Hypertension   . Prostate cancer ([place]) 2019  . Umbilical hernia 3094    Past Surgical History:  Procedure Laterality Date  . COLONOSCOPY    . KNEE SURGERY Right    at age 58; clean out  . PELVIC LYMPH NODE DISSECTION Bilateral 04/26/2018   Procedure: PELVIC LYMPH NODE DISSECTION;  Surgeon: [name], MD;  Location: [facility] ORS;  Service: Urology;  Laterality: Bilateral;  . ROBOT ASSISTED LAPAROSCOPIC RADICAL PROSTATECTOMY N/A 04/26/2018   Procedure: ROBOTIC ASSISTED LAPAROSCOPIC RADICAL PROSTATECTOMY;  Surgeon: [name], MD;  Location: [facility] ORS;  Service: Urology;  Laterality: N/A;    There were no vitals filed for this visit.  Subjective Assessment - 08/13/18 0025    Subjective  Pt reports his feet are feeling better and was able to hike 3-4 miles. Pt has been doing his balancing exercises without weights as recommended.     Pertinent History  Denied LBP, injuries to knees, ankle, feet.  R knee surgery at the age 74-15 year old.     Patient Stated Goals  get back into the routine with exercises after surgery         [facility] PT Assessment - 08/13/18 0025      Observation/Other Assessments   Observations  less loading and medial glide of navicular bone in sit to stand when cued with DF, loading heel only.  loading activities such as sit to stand and squat, toe gripping and less transverse arch co-activation       Squat   Comments  cued for higher squat for less loading on medial arches B       Palpation   Palpation comment signficantly increased retinaculum B, tightness at B  flexor digitorium, R abductor digiti minimi, R adductor hallucis transverse and oblique, hypomobility of B navicular/.medial cuneiform,          Ambulation/Gait   Gait Comments  more flexible foot for stronger push off noted post Tx                    [facility] Adult PT Treatment/Exercise - 08/13/18 0025      Therapeutic Activites    Therapeutic Activities  --   sit to stand moidifcations to decrease medial collapse Rfoot   Other Therapeutic Activities  gait training with more wider toes, ballmound release / push off, ( Cued for "dont curl toes")       Neuro Re-ed    Neuro Re-ed Details   see pt instructions ( cued for more weight bearing on lateral foot in squats, not as low in squats, modified sit to stand    Added isometric PF, toe ext against yoga block/wall60 deg      Manual Therapy   Manual therapy comments  STM along abductor hallucis R. flexor digitorium R,  B flexor digitorium , R abductor digiti minimi, R adductor hallucis transverse and oblique,  Medial mob at B navicular/.medial cuneiform in OKC and CKC/ MWM     Kinesiotex  --   to promote arch support B  PT Short Term Goals - 04/05/18 2001      PT SHORT TERM GOAL #1   Title  Pt wil demo proper body mechanics to minimize straining of abdomen and pelvic floor ( lifting dumbbells, kettlebells, stand<> floor and sit to stand t/f and lifting 40 lb back bag from ground to shoulders)     Time  4    Period  Weeks    Status  Achieved      PT SHORT TERM GOAL #2   Title  Pt will demo proper deep core coordination with proper diaphragmatic excursion in order to optimize postural stability and pelvic floor function    Time  2    Period  Weeks    Status  Achieved      PT SHORT TERM GOAL #3   Title  Pt will demo IND with flexibility HEP w/ modificaitons to R knee limited ROM to utilize during 77 miles hiking trip in order to maintain proper pelvic floor ROM and minimize overactivity of lower kinetic  chain      Time  4    Period  Weeks    Status  Achieved        PT Long Term Goals - 07/29/18 1322      PT LONG TERM GOAL #1   Title  Pt will decrease his IPSS score from 6 pts ( mild)  to < 3 pts and QOL score 2 pts to 0 pts in order to improve pelvic function    Time  12    Period  Weeks    Status  On-going      PT LONG TERM GOAL #2   Title  Pt will demo decrease his abdominal separation from 3 fingers below sternum to < 2 fingers in order to improve intraabdominal pressure for improve pelvic floor function    Time  10    Period  Weeks    Status  On-going      PT LONG TERM GOAL #3   Title  Pt will increase R QL mm mass and strengthen and demo no lumbopelvic perturbations with L hip ext MMT  in order to minimize imbalances 2/2 R knee limitation and minimize risks for injuries with fitness routines.      Time  6    Period  Weeks    Status  On-going      PT LONG TERM GOAL #4   Title  Pt will demo proper alignment and technique with weight lifting, body weight training and fitness routine to minimzie risk for worsening of incontinence    Time  10    Period  Weeks    Status  On-going      PT LONG TERM GOAL #5   Title  Pt will decrease pad wear from 2 to 0 in order to  improve QOL     Time  6    Period  Weeks    Status  On-going      Additional Long Term Goals   Additional Long Term Goals  Yes      PT LONG TERM GOAL #6   Title  Pt will demo improved R PF from 5 reps Grade 3/5 to Grade 4/5 > 10 reps and improved SLS on R with more stability for 30 sec in order to perform fitness exercises and minimize risks for falls     Time  8    Period  Weeks    Status  New  Target Date  09/23/18            Plan - 08/13/18 0035    Clinical Impression Statement  Pt continued to require regional interdependant approach to address lower kinetic chain deficits ( B medially collapsed arch, restricted R knee flexion) in order to practice loaded activities in standing and weight  lifting activities with less risk for injuries and optimal deep core / lower kinetic chain co-activation. Still continuing with CKC exercises in seated position to promote proper alignment of feet and increasing mobility. Pt is not ready to perform CKC exercises in SLS until medial and plantar arch improves from medial collapse arches. Pt continues require graded progression towards SLS balance activities given RLE deviations as stated above. Pt was educated to withhold from using weight in squat and not to lower as low, and to improve with co-activation of transverse arches. Pt was also educated to engage in less toe gripping for optimal somatosensory retraining and balance. Pt continues to benefit from skilled PT.    Rehab Potential  Good    PT Frequency  1x / week    PT Duration  12 weeks    PT Treatment/Interventions  Balance training;Therapeutic exercise;Neuromuscular re-education;Stair training;Gait training;Moist Heat;Patient/family education;Therapeutic activities;Functional mobility training;Manual techniques;Biofeedback;Dry needling;Scar mobilization;Taping;Electrical Stimulation;Energy conservation    Consulted and Agree with Plan of Care  Patient       Patient will benefit from skilled therapeutic intervention in order to improve the following deficits and impairments:  Improper body mechanics, Increased muscle spasms, Postural dysfunction, Decreased coordination, Decreased mobility, Decreased range of motion, Decreased endurance, Difficulty walking, Decreased strength, Abnormal gait  Visit Diagnosis: Muscle weakness (generalized)  Abnormal posture  Unspecified lack of coordination     Problem List Patient Active Problem List   Diagnosis Date Noted  . Prostate cancer ([place]) 04/26/2018  . Allergic rhinitis, seasonal 04/15/2015  . Abnormal blood sugar 04/11/2015  . Benign hypertension 04/11/2015  . Dyslipidemia 04/11/2015  . Obesity (BMI 30.0-34.9) 04/11/2015  . Right medial  knee pain 04/11/2015  . Metabolic syndrome 16/60/6301  . Left shoulder pain 04/11/2015  . Umbilical hernia 60/04/9322  . Microcytosis 04/11/2015    Jerl Mina ,PT, DPT, E-RYT  08/13/2018, 12:36 AM  [place] MAIN [facility] SERVICES [address] [place], [place], [address] Phone: [phone]   Fax:  [phone]  Name: DELMAS FAUCETT MRN: [id number] Date of Birth: [birth year]

## 2018-08-18 ENCOUNTER — Ambulatory Visit: Payer: 59 | Admitting: Physical Therapy

## 2018-08-18 DIAGNOSIS — M6281 Muscle weakness (generalized): Secondary | ICD-10-CM | POA: Diagnosis not present

## 2018-08-18 DIAGNOSIS — R279 Unspecified lack of coordination: Secondary | ICD-10-CM

## 2018-08-18 DIAGNOSIS — R293 Abnormal posture: Secondary | ICD-10-CM

## 2018-08-19 ENCOUNTER — Encounter: Payer: Self-pay | Admitting: Family Medicine

## 2018-08-19 ENCOUNTER — Ambulatory Visit (INDEPENDENT_AMBULATORY_CARE_PROVIDER_SITE_OTHER): Payer: 59 | Admitting: Family Medicine

## 2018-08-19 VITALS — BP 120/70 | HR 80 | Temp 98.1°F | Resp 16 | Ht 71.0 in | Wt 250.7 lb

## 2018-08-19 DIAGNOSIS — I10 Essential (primary) hypertension: Secondary | ICD-10-CM

## 2018-08-19 DIAGNOSIS — E785 Hyperlipidemia, unspecified: Secondary | ICD-10-CM | POA: Diagnosis not present

## 2018-08-19 DIAGNOSIS — Z114 Encounter for screening for human immunodeficiency virus [HIV]: Secondary | ICD-10-CM

## 2018-08-19 DIAGNOSIS — E669 Obesity, unspecified: Secondary | ICD-10-CM

## 2018-08-19 DIAGNOSIS — E8881 Metabolic syndrome: Secondary | ICD-10-CM | POA: Diagnosis not present

## 2018-08-19 DIAGNOSIS — D72829 Elevated white blood cell count, unspecified: Secondary | ICD-10-CM

## 2018-08-19 MED ORDER — PRAVASTATIN SODIUM 40 MG PO TABS: 40.0000 mg | ORAL_TABLET | Freq: Every day | ORAL | 1 refills | Status: DC

## 2018-08-19 MED ORDER — QUINAPRIL-HYDROCHLOROTHIAZIDE 20-12.5 MG PO TABS: 1.0000 | ORAL_TABLET | Freq: Every day | ORAL | 1 refills | Status: DC

## 2018-08-19 NOTE — Therapy (Signed)
[place] MAIN [facility] SERVICES [address] [place], [place], [address] Phone: [phone]   Fax:  [phone]  Physical Therapy Treatment  Patient Details  Name: Jeffrey Whitaker MRN: [id number] Date of Birth: Mar 01, 1961 Referring Provider (PT): [name] MD   Encounter Date: 08/18/2018  PT End of Session - 08/19/18 2155    Visit Number  6    Number of Visits  12    Date for PT Re-Evaluation  10/06/18    PT Start Time  2202    PT Stop Time  1100    PT Time Calculation (min)  58 min    Activity Tolerance  Patient tolerated treatment well    Behavior During Therapy  [facility] for tasks assessed/performed       Past Medical History:  Diagnosis Date  . Elevated PSA   . Hypertension   . Prostate cancer ([place]) 2019  . Umbilical hernia 5427    Past Surgical History:  Procedure Laterality Date  . COLONOSCOPY    . KNEE SURGERY Right    at age 5; clean out  . PELVIC LYMPH NODE DISSECTION Bilateral 04/26/2018   Procedure: PELVIC LYMPH NODE DISSECTION;  Surgeon: [name], MD;  Location: [facility] ORS;  Service: Urology;  Laterality: Bilateral;  . ROBOT ASSISTED LAPAROSCOPIC RADICAL PROSTATECTOMY N/A 04/26/2018   Procedure: ROBOTIC ASSISTED LAPAROSCOPIC RADICAL PROSTATECTOMY;  Surgeon: [name], MD;  Location: [facility] ORS;  Service: Urology;  Laterality: N/A;    There were no vitals filed for this visit.  Subjective Assessment - 08/19/18 2155    Subjective  Pt noticed his R foot is stronger. Pt hiked 5 miles for 2 hour, stronger with stepping over stones, roots. Pt makes sure when he steps off, he is planting on the ballmound.  This week, pt achieved the 1000 calories in 94 mins with his cardio workout. ( 20 min elliptical, 20 min treadmill, light core workout) which is the first time pt achieved this intensity after the surgery       Pertinent History  Denied LBP, injuries to knees, ankle, feet.  R knee surgery at the age 51-52 year old.      Patient Stated Goals  get back into the routine with exercises after surgery         [facility] PT Assessment - 08/19/18 2156      Observation/Other Assessments   Observations  R navicular joint more medially positioned but required further manual Tx      Sit to Stand   Comments  no cues required for minimize medial collapse of arch, STJ       Palpation   Palpation comment  flexor digitorium brevis, lumbricals, interosseous R ,  medial cuneiform R hypobile                    [facility] Adult PT Treatment/Exercise - 08/19/18 2200      Therapeutic Activites    Other Therapeutic Activities  gait training with stronger push off cues  and to minimize pronation, medial collapse of arch, cued for toe abduction in stance and push off       Neuro Re-ed    Neuro Re-ed Details   cued for flexibility of foot, toe extension in back leg in calf stretch, cued for stronger push off in gait to promote more plantar fascia mobility to build arch , minimize medial collapse of arches        Manual Therapy   Manual therapy  comments   navicular/ medial cuneiform GRADE III MOB IN OKC, CKC,  STM at mm noted in assessment                  PT Short Term Goals - 04/05/18 2001      PT SHORT TERM GOAL #1   Title  Pt wil demo proper body mechanics to minimize straining of abdomen and pelvic floor ( lifting dumbbells, kettlebells, stand<> floor and sit to stand t/f and lifting 40 lb back bag from ground to shoulders)     Time  4    Period  Weeks    Status  Achieved      PT SHORT TERM GOAL #2   Title  Pt will demo proper deep core coordination with proper diaphragmatic excursion in order to optimize postural stability and pelvic floor function    Time  2    Period  Weeks    Status  Achieved      PT SHORT TERM GOAL #3   Title  Pt will demo IND with flexibility HEP w/ modificaitons to R knee limited ROM to utilize during 77 miles hiking trip in order to maintain proper pelvic floor ROM and  minimize overactivity of lower kinetic chain      Time  4    Period  Weeks    Status  Achieved        PT Long Term Goals - 07/29/18 1322      PT LONG TERM GOAL #1   Title  Pt will decrease his IPSS score from 6 pts ( mild)  to < 3 pts and QOL score 2 pts to 0 pts in order to improve pelvic function    Time  12    Period  Weeks    Status  On-going      PT LONG TERM GOAL #2   Title  Pt will demo decrease his abdominal separation from 3 fingers below sternum to < 2 fingers in order to improve intraabdominal pressure for improve pelvic floor function    Time  10    Period  Weeks    Status  On-going      PT LONG TERM GOAL #3   Title  Pt will increase R QL mm mass and strengthen and demo no lumbopelvic perturbations with L hip ext MMT  in order to minimize imbalances 2/2 R knee limitation and minimize risks for injuries with fitness routines.      Time  6    Period  Weeks    Status  On-going      PT LONG TERM GOAL #4   Title  Pt will demo proper alignment and technique with weight lifting, body weight training and fitness routine to minimzie risk for worsening of incontinence    Time  10    Period  Weeks    Status  On-going      PT LONG TERM GOAL #5   Title  Pt will decrease pad wear from 2 to 0 in order to  improve QOL     Time  6    Period  Weeks    Status  On-going      Additional Long Term Goals   Additional Long Term Goals  Yes      PT LONG TERM GOAL #6   Title  Pt will demo improved R PF from 5 reps Grade 3/5 to Grade 4/5 > 10 reps and improved SLS on R with more stability  for 30 sec in order to perform fitness exercises and minimize risks for falls     Time  8    Period  Weeks    Status  New    Target Date  09/23/18            Plan - 08/19/18 2204    Clinical Impression Statement  Pt is progressing well improved alignment of navicular bone, less medial collapse, pronation on R foot. Following further manual Tx and training, pt demo'd stronger push off in  gait and restored more flexibility in 3 parts of foot.  Decreasing pt frequency to every other week as pt will need less manual Tx.   Progressing pt to loaded plantar arch strengthening to improve balance on R LE in order to proform fitness exercises with less risk for injuries. Pt continues to benefit from skilled PT    Rehab Potential  Good    PT Frequency  1x / week    PT Duration  12 weeks    PT Treatment/Interventions  Balance training;Therapeutic exercise;Neuromuscular re-education;Stair training;Gait training;Moist Heat;Patient/family education;Therapeutic activities;Functional mobility training;Manual techniques;Biofeedback;Dry needling;Scar mobilization;Taping;Electrical Stimulation;Energy conservation    Consulted and Agree with Plan of Care  Patient       Patient will benefit from skilled therapeutic intervention in order to improve the following deficits and impairments:  Improper body mechanics, Increased muscle spasms, Postural dysfunction, Decreased coordination, Decreased mobility, Decreased range of motion, Decreased endurance, Difficulty walking, Decreased strength, Abnormal gait  Visit Diagnosis: Muscle weakness (generalized)  Abnormal posture  Unspecified lack of coordination     Problem List Patient Active Problem List   Diagnosis Date Noted  . Prostate cancer ([place]) 04/26/2018  . Allergic rhinitis, seasonal 04/15/2015  . Abnormal blood sugar 04/11/2015  . Benign hypertension 04/11/2015  . Dyslipidemia 04/11/2015  . Obesity (BMI 30.0-34.9) 04/11/2015  . Right medial knee pain 04/11/2015  . Metabolic syndrome 29/51/8841  . Left shoulder pain 04/11/2015  . Umbilical hernia 66/12/3014  . Microcytosis 04/11/2015    Jerl Mina ,PT, DPT, E-RYT  08/19/2018, 10:07 PM  [place] MAIN [facility] SERVICES [address] [place], [place], [address] Phone: [phone]   Fax:  [phone]  Name: Jeffrey Whitaker MRN:  [id number] Date of Birth: Sep 25, 1960

## 2018-08-19 NOTE — Patient Instructions (Signed)
In calf stretch: Focus on raising heel up, lower slow  When walking, focus on stronger push off in hind foot

## 2018-08-19 NOTE — Progress Notes (Signed)
Name: Jeffrey Whitaker   MRN: [id number]    DOB: February 25, 1961   Date:08/19/2018       Progress Note  Subjective  Chief Complaint  Chief Complaint  Patient presents with  . Hypertension  . dyslipidemia    HPI  Metabolic Syndrome: on diet and exercise, denies polyphagia, polyuria or polydipsia, last hgbA1C6%, he splurged thought the holidays but not much, recheck labs yearly   HTN: he is taking medication as prescribed, denies chest pain or palpitation.BP is at goal, he denies side effects of medication. No dizziness.   Obesity: he had lost weight prior to last visit but gained it back since not as active after prostate cancer surgery, still healthy and is gradually getting back to his exercise regiment  History of prostate cancer: s/p surgery done 03/2018, under the care of Dr. Erlene Quan, he is doing well, still getting pelvic floor PT and is down to one cup daily . Last PSA was normal    Patient Active Problem List   Diagnosis Date Noted  . Prostate cancer ([place]) 04/26/2018  . Allergic rhinitis, seasonal 04/15/2015  . Abnormal blood sugar 04/11/2015  . Benign hypertension 04/11/2015  . Dyslipidemia 04/11/2015  . Obesity (BMI 30.0-34.9) 04/11/2015  . Right medial knee pain 04/11/2015  . Metabolic syndrome 54/27/0623  . Left shoulder pain 04/11/2015  . Umbilical hernia 76/28/3151  . Microcytosis 04/11/2015    Past Surgical History:  Procedure Laterality Date  . COLONOSCOPY    . KNEE SURGERY Right    at age 33; clean out  . PELVIC LYMPH NODE DISSECTION Bilateral 04/26/2018   Procedure: PELVIC LYMPH NODE DISSECTION;  Surgeon: [name], MD;  Location: [facility] ORS;  Service: Urology;  Laterality: Bilateral;  . ROBOT ASSISTED LAPAROSCOPIC RADICAL PROSTATECTOMY N/A 04/26/2018   Procedure: ROBOTIC ASSISTED LAPAROSCOPIC RADICAL PROSTATECTOMY;  Surgeon: [name], MD;  Location: [facility] ORS;  Service: Urology;  Laterality: N/A;    Family History  Problem Relation Age of  Onset  . Hypertension Mother   . CAD Mother   . Lung cancer Mother   . Benign prostatic hyperplasia Maternal Uncle   . Hypertension Sister   . Kidney disease Neg Hx   . Prostate cancer Neg Hx   . Bladder Cancer Neg Hx   . Kidney cancer Neg Hx     Social History   Socioeconomic History  . Marital status: Married    Spouse name: Santiago Glad  . Number of children: 2  . Years of education: Not on file  . Highest education level: Associate degree: occupational, Hotel manager, or vocational program  Occupational History  . Not on file  Social Needs  . Financial resource strain: Not hard at all  . Food insecurity:    Worry: Never true    Inability: Never true  . Transportation needs:    Medical: No    Non-medical: No  Tobacco Use  . Smoking status: Former Smoker    Years: 2.00    Types: Cigarettes    Start date: 07/28/1980    Last attempt to quit: 07/28/1982    Years since quitting: 36.0  . Smokeless tobacco: Former Systems developer    Quit date: 07/29/1987  Substance and Sexual Activity  . Alcohol use: Yes    Alcohol/week: 0.0 standard drinks    Comment: rarely  . Drug use: No  . Sexual activity: Yes    Partners: Female  Lifestyle  . Physical activity:    Days per week: 4 days    Minutes  per session: 30 min  . Stress: Not at all  Relationships  . Social connections:    Talks on phone: More than three times a week    Gets together: Twice a week    Attends religious service: Never    Active member of club or organization: No    Attends meetings of clubs or organizations: Never    Relationship status: Married  . Intimate partner violence:    Fear of current or ex partner: No    Emotionally abused: No    Physically abused: No    Forced sexual activity: No  Other Topics Concern  . Not on file  Social History Narrative   Patient has 2 step-children     Current Outpatient Medications:  .  aspirin EC 81 MG tablet, Take 81 mg by mouth daily., Disp: , Rfl:  .  Multiple Vitamins-Minerals  (MENS 50+ MULTI VITAMIN/MIN PO), Take 1 tablet by mouth daily., Disp: , Rfl:  .  NONFORMULARY OR COMPOUNDED ITEM, Trimix (30/1/10)-(Pap/Phent/PGE) Dosage: Test dose 3cc size vial Qty1 [place] [phone] Fax [phone], Disp: 1 each, Rfl: 0 .  pravastatin (PRAVACHOL) 40 MG tablet, Take 1 tablet (40 mg total) by mouth daily., Disp: 90 tablet, Rfl: 1 .  quinapril-hydrochlorothiazide (ACCURETIC) 20-12.5 MG tablet, Take 1 tablet by mouth daily., Disp: 90 tablet, Rfl: 1 .  sildenafil (REVATIO) 20 MG tablet, Take 1 tablet (20 mg total) by mouth as needed. Take 1-5 tabs as needed prior to intercourse, Disp: 30 tablet, Rfl: 11  No Known Allergies  I personally reviewed active problem list, medication list, allergies, family history, social history with the patient/caregiver today.   ROS  Constitutional: Negative for fever, positive for  weight change.  Respiratory: Negative for cough and shortness of breath.   Cardiovascular: Negative for chest pain or palpitations.  Gastrointestinal: Negative for abdominal pain, no bowel changes.  Musculoskeletal: Negative for gait problem or joint swelling.  Skin: Negative for rash.  Neurological: Negative for dizziness or headache.  No other specific complaints in a complete review of systems (except as listed in HPI above).  Objective  Vitals:   08/19/18 0952  BP: 120/70  Pulse: 80  Resp: 16  Temp: 98.1 F (36.7 C)  TempSrc: Oral  SpO2: 98%  Weight: 250 lb 11.2 oz (113.7 kg)  Height: 5\' 11"  (1.803 m)    Body mass index is 34.97 kg/m.  Physical Exam  Constitutional: Patient appears well-developed and well-nourished. Overweight.  No distress.  HEENT: head atraumatic, normocephalic, pupils equal and reactive to light,neck supple, throat within normal limits Cardiovascular: Normal rate, regular rhythm and normal heart sounds.  No murmur heard. No BLE edema. Pulmonary/Chest: Effort normal and breath sounds normal. No  respiratory distress. Abdominal: Soft.  There is no tenderness. Psychiatric: Patient has a normal mood and affect. behavior is normal. Judgment and thought content normal.  Recent Results (from the past 2160 hour(s))  PSA     Status: None   Collection Time: 06/10/18  8:15 AM  Result Value Ref Range   Prostate Specific Ag, Serum <0.1 0.0 - 4.0 ng/mL    Comment: Roche ECLIA methodology. According to the American Urological Association, Serum PSA should decrease and remain at undetectable levels after radical prostatectomy. The AUA defines biochemical recurrence as an initial PSA value 0.2 ng/mL or greater followed by a subsequent confirmatory PSA value 0.2 ng/mL or greater. Values obtained with different assay methods or kits cannot be used interchangeably. Results cannot be  interpreted as absolute evidence of the presence or absence of malignant disease.      PHQ2/9: Depression screen [facility] 2/9 05/20/2018 02/10/2018 04/22/2017 10/17/2016 04/17/2016  Decreased Interest 0 0 0 0 0  Down, Depressed, Hopeless 0 0 0 0 0  PHQ - 2 Score 0 0 0 0 0  Altered sleeping 0 0 - - -  Tired, decreased energy 0 0 - - -  Change in appetite 0 0 - - -  Feeling bad or failure about yourself  0 0 - - -  Trouble concentrating 0 0 - - -  Moving slowly or fidgety/restless 0 0 - - -  Suicidal thoughts 0 0 - - -  PHQ-9 Score 0 0 - - -  Difficult doing work/chores Not difficult at all - - - -     Fall Risk: Fall Risk  05/20/2018 02/10/2018 04/22/2017 10/17/2016 04/17/2016  Falls in the past year? No No No No No     Assessment & Plan  1. Benign hypertension  - Comprehensive metabolic panel - quinapril-hydrochlorothiazide (ACCURETIC) 20-12.5 MG tablet; Take 1 tablet by mouth daily.  Dispense: 90 tablet; Refill: 1  2. Encounter for screening for HIV  - HIV Antibody (routine testing w rflx)  3. Metabolic syndrome  Recheck labs next visit   4. Dyslipidemia  - pravastatin (PRAVACHOL) 40 MG tablet;  Take 1 tablet (40 mg total) by mouth daily.  Dispense: 90 tablet; Refill: 1  5. Obesity (BMI 30.0-34.9)  Discussed with the patient the risk posed by an increased BMI. Discussed importance of portion control, calorie counting and at least 150 minutes of physical activity weekly. Avoid sweet beverages and drink more water. Eat at least 6 servings of fruit and vegetables daily   6. Leukocytosis, unspecified type  - CBC with Differential/Platelet

## 2018-08-26 NOTE — Progress Notes (Signed)
08/27/2018 9:52 AM  [name] 1960/10/10 [id number]   Referring provider: Steele Sizer, MD [address] [place], [place] [address]   Chief Complaint  Patient presents with  . Erectile Dysfunction    HPI: Jeffrey Whitaker is a 58 y.o. male African-American status post radical prostatectomy with bilateral lymph node dissection who has erectile dysfunction presenting today for Trimix instruction.  Background History Patient had a history of elevated PSAs, BPH with LUTS, and ED (SHIM scores of 23 or 25) prior to robotic radical prostatectomy with bilateral lymph node dissection in 04/26/2018.  Surgical pathology showed Gleason 3+4 prostate cancer without evidence of extracapsular extension.  Margins negative.  No seminal vesicle and bladder involvement.  Lymph nodes negative.  pT2 N0.  Patient experienced postoperative stress incontinence, for which he was referred to physical therapy, and on his visit with Dr. Erlene Quan on 05/25/2018 reported that he had not had any erections.  Sildenafil was prescribed but was ineffective.  Patient denies today (08/27/2018) curved and/or painful erections.  Patient reports no longer experiencing spontaneous erections.    PMH: Past Medical History:  Diagnosis Date  . Elevated PSA   . Hypertension   . Prostate cancer ([place]) 2019  . Umbilical hernia 0272    Surgical History: Past Surgical History:  Procedure Laterality Date  . COLONOSCOPY    . KNEE SURGERY Right    at age 35; clean out  . PELVIC LYMPH NODE DISSECTION Bilateral 04/26/2018   Procedure: PELVIC LYMPH NODE DISSECTION;  Surgeon: [name], MD;  Location: [facility] ORS;  Service: Urology;  Laterality: Bilateral;  . ROBOT ASSISTED LAPAROSCOPIC RADICAL PROSTATECTOMY N/A 04/26/2018   Procedure: ROBOTIC ASSISTED LAPAROSCOPIC RADICAL PROSTATECTOMY;  Surgeon: [name], MD;  Location: [facility] ORS;  Service: Urology;  Laterality: N/A;    Home Medications:    Allergies as of 08/27/2018   No Known Allergies     Medication List       Accurate as of August 27, 2018  9:52 AM. Always use your most recent med list.        aspirin EC 81 MG tablet Take 81 mg by mouth daily.   MENS 50+ MULTI VITAMIN/MIN PO Take 1 tablet by mouth daily.   NONFORMULARY OR COMPOUNDED ITEM Trimix (30/1/10)-(Pap/Phent/PGE) Dosage: Test dose 3cc size vial Qty1 [place] [phone] Fax [phone]   pravastatin 40 MG tablet Commonly known as:  PRAVACHOL Take 1 tablet (40 mg total) by mouth daily.   quinapril-hydrochlorothiazide 20-12.5 MG tablet Commonly known as:  ACCURETIC Take 1 tablet by mouth daily.   sildenafil 20 MG tablet Commonly known as:  REVATIO Take 1 tablet (20 mg total) by mouth as needed. Take 1-5 tabs as needed prior to intercourse       Allergies: No Known Allergies  Family History: Family History  Problem Relation Age of Onset  . Hypertension Mother   . CAD Mother   . Lung cancer Mother   . Benign prostatic hyperplasia Maternal Uncle   . Hypertension Sister   . Kidney disease Neg Hx   . Prostate cancer Neg Hx   . Bladder Cancer Neg Hx   . Kidney cancer Neg Hx     Social History:  reports that he quit smoking about 36 years ago. His smoking use included cigarettes. He started smoking about 38 years ago. He quit after 2.00 years of use. He quit smokeless tobacco use about 31 years ago. He reports current alcohol use. He reports  that he does not use drugs.  ROS: UROLOGY Frequent Urination?: No Hard to postpone urination?: No Burning/pain with urination?: No Get up at night to urinate?: No Leakage of urine?: No Urine stream starts and stops?: No Trouble starting stream?: No Do you have to strain to urinate?: No Blood in urine?: No Urinary tract infection?: No Sexually transmitted disease?: No Injury to kidneys or bladder?: No Painful intercourse?: No Weak stream?: No Erection problems?:  No Penile pain?: No  Gastrointestinal Nausea?: No Vomiting?: No Indigestion/heartburn?: No Diarrhea?: No Constipation?: No  Constitutional Fever: No Night sweats?: No Weight loss?: No Fatigue?: No  Skin Skin rash/lesions?: No Itching?: No  Eyes Blurred vision?: No Double vision?: No  Ears/Nose/Throat Sore throat?: No Sinus problems?: No  Hematologic/Lymphatic Swollen glands?: No Easy bruising?: No  Cardiovascular Leg swelling?: No Chest pain?: No  Respiratory Cough?: No Shortness of breath?: No  Endocrine Excessive thirst?: No  Musculoskeletal Back pain?: No Joint pain?: No  Neurological Headaches?: No Dizziness?: No  Psychologic Depression?: No Anxiety?: No  Physical Exam: BP 112/72 (BP Location: Left Arm, Patient Position: Sitting, Cuff Size: Large)   Pulse 76   Ht 5\' 11"  (1.803 m)   Wt 245 lb 6.4 oz (111.3 kg)   BMI 34.23 kg/m   Constitutional:  Well nourished. Alert and oriented, No acute distress. Cardiovascular: No clubbing, cyanosis, or edema. Respiratory: Normal respiratory effort, no increased work of breathing. GU: No CVA tenderness.  No bladder fullness or masses.  Patient with circumcised phallus. Urethral meatus is patent.  No penile discharge. No penile lesions or rashes. Skin: No rashes, bruises or suspicious lesions. Neurologic: Grossly intact, no focal deficits, moving all 4 extremities. Psychiatric: Normal mood and affect.  Laboratory Data:   PSA Trend  3.2 in Dec 12, 2014  4.9 in October 15, 2015  4.5 in January 09, 2016  4.3 in July 10 20th 2017 with 10% probability of prostate cancer  4.5 in April 17, 2016 with 5% probability of prostate cancer  4.5 in July 01, 2016  4.8 in December 26, 2016  5.4 in July 02, 2017   4.8 in September 30, 2017  4.5 in December 31, 2017  0.1 in May 20, 2018 (s/p prostatectomy; surgical pathology found Gleason 3+4 prostate cancer without evidence of extracapsular extension)  <0.1 in  June 10, 2018  Lab Results  Component Value Date   HGBA1C 6.0 (H) 02/18/2018       Component Value Date/Time   CHOL 150 02/18/2018 1008   HDL 53 02/18/2018 1008   CHOLHDL 2.8 02/18/2018 1008   LDLCALC 82 02/18/2018 1008    Lab Results  Component Value Date   AST 17 02/18/2018   Lab Results  Component Value Date   ALT 15 02/18/2018   I have reviewed the labs.  Procedure Patient's left corpus cavernosum is identified.  An area near the base of the penis is cleansed with rubbing alcohol.  Careful to avoid the dorsal vein, 2 mcg of Trimix (papaverine 30 mg, phentolamine 1 mg and prostaglandin E1 10 mcg, Lot # [id number]  exp # 08/29/2018 is injected at a 90 degree angle into the left corpus cavernosum near the base of the penis.  Patient experienced a semi-firm erection in 15 minutes.  Assessment & Plan:    1. Prostate Cancer [facility]) - keep follow up appointment with [facility] in 11/2018   2. Stress Incontinence of Urine - Currently under Physical Therapy  3. Erectile Dysfunction after Radical Prostatectomy -  Patient has tried sildenafil without success - Patient was instructed in intracavernosal injections today - Patient will titrate up to 3 mcg of Trimix with his next injection on Monday and MyChart [place] with results - Advised patient of the condition of priapism, painful erection lasting for more than four hours, and to contact the office immediately or seek treatment in the ED  Return for Patient to contact us with results .  These notes generated with voice recognition software. I apologize for typographical errors.  Zara Council, PA-C  [facility] [address] [place], [place] [address] [phone]  I, Adele Schilder, am acting as a Education administrator for [employer], PA-C.   I have reviewed the above documentation for accuracy and completeness, and I agree with the above.    Zara Council, PA-C

## 2018-08-27 ENCOUNTER — Encounter: Payer: Self-pay | Admitting: Urology

## 2018-08-27 ENCOUNTER — Ambulatory Visit (INDEPENDENT_AMBULATORY_CARE_PROVIDER_SITE_OTHER): Payer: 59 | Admitting: Urology

## 2018-08-27 VITALS — BP 112/72 | HR 76 | Ht 71.0 in | Wt 245.4 lb

## 2018-08-27 DIAGNOSIS — N529 Male erectile dysfunction, unspecified: Secondary | ICD-10-CM | POA: Diagnosis not present

## 2018-08-27 DIAGNOSIS — D72829 Elevated white blood cell count, unspecified: Secondary | ICD-10-CM | POA: Diagnosis not present

## 2018-08-27 DIAGNOSIS — I10 Essential (primary) hypertension: Secondary | ICD-10-CM | POA: Diagnosis not present

## 2018-08-28 LAB — CBC WITH DIFFERENTIAL/PLATELET
BASOS: 1 %
Basophils Absolute: 0.1 10*3/uL (ref 0.0–0.2)
EOS (ABSOLUTE): 0.2 10*3/uL (ref 0.0–0.4)
EOS: 3 %
HEMATOCRIT: 45.6 % (ref 37.5–51.0)
Hemoglobin: 14.2 g/dL (ref 13.0–17.7)
IMMATURE GRANULOCYTES: 0 %
Immature Grans (Abs): 0 10*3/uL (ref 0.0–0.1)
LYMPHS ABS: 1.2 10*3/uL (ref 0.7–3.1)
Lymphs: 20 %
MCH: 23.3 pg — ABNORMAL LOW (ref 26.6–33.0)
MCHC: 31.1 g/dL — ABNORMAL LOW (ref 31.5–35.7)
MCV: 75 fL — AB (ref 79–97)
MONOS ABS: 0.4 10*3/uL (ref 0.1–0.9)
Monocytes: 7 %
NEUTROS ABS: 4.2 10*3/uL (ref 1.4–7.0)
NEUTROS PCT: 69 %
Platelets: 231 10*3/uL (ref 150–450)
RBC: 6.09 x10E6/uL — ABNORMAL HIGH (ref 4.14–5.80)
RDW: 13.7 % (ref 11.6–15.4)
WBC: 6 10*3/uL (ref 3.4–10.8)

## 2018-08-28 LAB — COMPREHENSIVE METABOLIC PANEL
A/G RATIO: 1.7 (ref 1.2–2.2)
ALT: 20 IU/L (ref 0–44)
AST: 19 IU/L (ref 0–40)
Albumin: 4.5 g/dL (ref 3.8–4.9)
Alkaline Phosphatase: 52 IU/L (ref 39–117)
BUN/Creatinine Ratio: 13 (ref 9–20)
BUN: 16 mg/dL (ref 6–24)
Bilirubin Total: 0.4 mg/dL (ref 0.0–1.2)
CALCIUM: 9.8 mg/dL (ref 8.7–10.2)
CO2: 23 mmol/L (ref 20–29)
Chloride: 107 mmol/L — ABNORMAL HIGH (ref 96–106)
Creatinine, Ser: 1.27 mg/dL (ref 0.76–1.27)
GFR, EST AFRICAN AMERICAN: 72 mL/min/{1.73_m2} (ref >59)
GFR, EST NON AFRICAN AMERICAN: 62 mL/min/{1.73_m2} (ref >59)
GLOBULIN, TOTAL: 2.6 g/dL (ref 1.5–4.5)
Glucose: 98 mg/dL (ref 65–99)
POTASSIUM: 4.4 mmol/L (ref 3.5–5.2)
SODIUM: 146 mmol/L — AB (ref 134–144)
TOTAL PROTEIN: 7.1 g/dL (ref 6.0–8.5)

## 2018-08-28 LAB — HIV ANTIBODY (ROUTINE TESTING W REFLEX): HIV Screen 4th Generation wRfx: NONREACTIVE

## 2018-08-30 ENCOUNTER — Ambulatory Visit: Payer: 59 | Attending: Urology | Admitting: Physical Therapy

## 2018-08-30 ENCOUNTER — Other Ambulatory Visit: Payer: Self-pay | Admitting: Radiology

## 2018-08-30 DIAGNOSIS — C61 Malignant neoplasm of prostate: Secondary | ICD-10-CM

## 2018-08-30 DIAGNOSIS — R293 Abnormal posture: Secondary | ICD-10-CM | POA: Insufficient documentation

## 2018-08-30 DIAGNOSIS — M6281 Muscle weakness (generalized): Secondary | ICD-10-CM | POA: Diagnosis not present

## 2018-08-30 DIAGNOSIS — R279 Unspecified lack of coordination: Secondary | ICD-10-CM | POA: Diagnosis present

## 2018-08-30 NOTE — Patient Instructions (Signed)
Discontinue the yoga block  Focusing knee out, b1st ballmound down, track heel and toes and hips straight , spreading ballmounds wide, toe tips up slightly   WALKING WITH RESISTANCE BLUE Band at waist connected to doorknob 60mins Stepping forward normal length steps, planting mid and forefoot down, center of mass ( navel) leans forward slightly as if you were walking uphill 3-4 steps till band feels taut ( MAKE SURE THE DOOR IS LOCKED AND WON'T OPEN)   Stepping backwards, lower heel slowly, carry trunk and hips back as you step     Letter T, [place] on one leg with band band under L foot, wrap by big toe then, outer knee/ thigh, L hand pulling ( elbow by side)  Plant ballmound, toes spread, thigh out against band,, tracking knee in line with 2-3rd toe line Dipping forward, R foot  ( whole body like a see saw) heel  off floor or   10 x 2  Both   Switch to tennis shoes for more hindfoot flexibility and dorsiflexion

## 2018-08-30 NOTE — Therapy (Signed)
[place] MAIN [facility] SERVICES [address] [place], [place], [address] Phone: [phone]   Fax:  [phone]  Physical Therapy Treatment  Patient Details  Name: Jeffrey Whitaker MRN: [id number] Date of Birth: 11-10-1960 Referring Provider (PT): [name] MD   Encounter Date: 08/30/2018  PT End of Session - 08/30/18 0912    Visit Number  7    Number of Visits  12    Date for PT Re-Evaluation  10/06/18    PT Start Time  0910    PT Stop Time  1009    PT Time Calculation (min)  59 min    Activity Tolerance  Patient tolerated treatment well    Behavior During Therapy  [facility] for tasks assessed/performed       Past Medical History:  Diagnosis Date  . Elevated PSA   . Hypertension   . Prostate cancer ([place]) 2019  . Umbilical hernia 5462    Past Surgical History:  Procedure Laterality Date  . COLONOSCOPY    . KNEE SURGERY Right    at age 26; clean out  . PELVIC LYMPH NODE DISSECTION Bilateral 04/26/2018   Procedure: PELVIC LYMPH NODE DISSECTION;  Surgeon: [name], MD;  Location: [facility] ORS;  Service: Urology;  Laterality: Bilateral;  . ROBOT ASSISTED LAPAROSCOPIC RADICAL PROSTATECTOMY N/A 04/26/2018   Procedure: ROBOTIC ASSISTED LAPAROSCOPIC RADICAL PROSTATECTOMY;  Surgeon: [name], MD;  Location: [facility] ORS;  Service: Urology;  Laterality: N/A;    There were no vitals filed for this visit.  Subjective Assessment - 08/30/18 1325    Subjective  Pt has no complaints with R foot exercises. Pt reports she wear his tennis when working out in the gym but has been wearing his hiking boots daily all day for many years. Pt continues to not use weights with single leg balance exercises    Pertinent History  Denied LBP, injuries to knees, ankle, feet.  R knee surgery at the age 79-74 year old.     Patient Stated Goals  get back into the routine with exercises after surgery         [facility] PT Assessment - 08/30/18 1013       Observation/Other Assessments   Observations  medial arch R less collapse, more supination noted but required cues for more hip abd/ER for less genu valgus       Strength   Overall Strength  --   standing/ single UE on wall, R PF 2/5, L 3+/5      Palpation   Palpation comment  Flexor digitorium brevis, flexor hallucis brevisu R tight .  cuneiform / navicular joint hypomobile, limited DF                    [facility] Adult PT Treatment/Exercise - 08/30/18 1311      Therapeutic Activites    Other Therapeutic Activities  gait training with stronger push off cues  and to minimize pronation, medial collapse of arch, cued for toe abduction in stance and push off       Neuro Re-ed    Neuro Re-ed Details   Cued for more PF/INV in lunge position and forward/backward walking w/ resistance band       Manual Therapy   Manual therapy comments  STM at areas noted in assessment, lateral mobs at medial cuneiform, navicular                PT Short Term Goals - 04/05/18 2001  PT SHORT TERM GOAL #1   Title  Pt wil demo proper body mechanics to minimize straining of abdomen and pelvic floor ( lifting dumbbells, kettlebells, stand<> floor and sit to stand t/f and lifting 40 lb back bag from ground to shoulders)     Time  4    Period  Weeks    Status  Achieved      PT SHORT TERM GOAL #2   Title  Pt will demo proper deep core coordination with proper diaphragmatic excursion in order to optimize postural stability and pelvic floor function    Time  2    Period  Weeks    Status  Achieved      PT SHORT TERM GOAL #3   Title  Pt will demo IND with flexibility HEP w/ modificaitons to R knee limited ROM to utilize during 77 miles hiking trip in order to maintain proper pelvic floor ROM and minimize overactivity of lower kinetic chain      Time  4    Period  Weeks    Status  Achieved        PT Long Term Goals - 07/29/18 1322      PT LONG TERM GOAL #1   Title  Pt will decrease  his IPSS score from 6 pts ( mild)  to < 3 pts and QOL score 2 pts to 0 pts in order to improve pelvic function    Time  12    Period  Weeks    Status  On-going      PT LONG TERM GOAL #2   Title  Pt will demo decrease his abdominal separation from 3 fingers below sternum to < 2 fingers in order to improve intraabdominal pressure for improve pelvic floor function    Time  10    Period  Weeks    Status  On-going      PT LONG TERM GOAL #3   Title  Pt will increase R QL mm mass and strengthen and demo no lumbopelvic perturbations with L hip ext MMT  in order to minimize imbalances 2/2 R knee limitation and minimize risks for injuries with fitness routines.      Time  6    Period  Weeks    Status  On-going      PT LONG TERM GOAL #4   Title  Pt will demo proper alignment and technique with weight lifting, body weight training and fitness routine to minimzie risk for worsening of incontinence    Time  10    Period  Weeks    Status  On-going      PT LONG TERM GOAL #5   Title  Pt will decrease pad wear from 2 to 0 in order to  improve QOL     Time  6    Period  Weeks    Status  On-going      Additional Long Term Goals   Additional Long Term Goals  Yes      PT LONG TERM GOAL #6   Title  Pt will demo improved R PF from 5 reps Grade 3/5 to Grade 4/5 > 10 reps and improved SLS on R with more stability for 30 sec in order to perform fitness exercises and minimize risks for falls     Time  8    Period  Weeks    Status  New    Target Date  09/23/18  Plan - 08/30/18 1321    Clinical Impression Statement  Pt is making improvements with less medially collapsed arch on R foot and is progressing to more upright loaded strengthening to increase plantar/transverse arch. Pt required excessive cues for less R genu valgus and was recommended to wear his tennis shoe more often than hiking boot to promote more flability of hind foot for DF in gait to minimize further pronation. Pt is not  ready for SLS loading yet. Pt continues to benefit from skilled PT.     Rehab Potential  Good    PT Frequency  1x / week    PT Duration  12 weeks    PT Treatment/Interventions  Balance training;Therapeutic exercise;Neuromuscular re-education;Stair training;Gait training;Moist Heat;Patient/family education;Therapeutic activities;Functional mobility training;Manual techniques;Biofeedback;Dry needling;Scar mobilization;Taping;Electrical Stimulation;Energy conservation    Consulted and Agree with Plan of Care  Patient       Patient will benefit from skilled therapeutic intervention in order to improve the following deficits and impairments:  Improper body mechanics, Increased muscle spasms, Postural dysfunction, Decreased coordination, Decreased mobility, Decreased range of motion, Decreased endurance, Difficulty walking, Decreased strength, Abnormal gait  Visit Diagnosis: Muscle weakness (generalized)  Abnormal posture  Unspecified lack of coordination     Problem List Patient Active Problem List   Diagnosis Date Noted  . Prostate cancer ([place]) 04/26/2018  . Allergic rhinitis, seasonal 04/15/2015  . Abnormal blood sugar 04/11/2015  . Benign hypertension 04/11/2015  . Dyslipidemia 04/11/2015  . Obesity (BMI 30.0-34.9) 04/11/2015  . Right medial knee pain 04/11/2015  . Metabolic syndrome 01/77/9390  . Left shoulder pain 04/11/2015  . Umbilical hernia 30/03/2329  . Microcytosis 04/11/2015    Jerl Mina ,PT, DPT, E-RYT t  08/30/2018, 1:26 PM  [place] MAIN [facility] SERVICES [address] [place], [place], [address] Phone: [phone]   Fax:  [phone]  Name: Jeffrey Whitaker MRN: [id number] Date of Birth: 1961-05-11

## 2018-09-01 ENCOUNTER — Telehealth: Payer: Self-pay | Admitting: Urology

## 2018-09-01 NOTE — Telephone Encounter (Signed)
Spoke with Jeffrey Whitaker and advised him to inject 0.3 of the Trimix with his next injection.  I asked that he inject the medication in the mornings during the week, so that if he develops priapism we are available to manage it in the office.   I reviewed the signs and symptoms of a priapism.  I advised him not to inject the Trimix more than every other day to reduce the risk of Peyronie's disease.  He will contact me by phone call or MyChart with his results.

## 2018-09-24 ENCOUNTER — Ambulatory Visit: Payer: 59 | Admitting: Physical Therapy

## 2018-09-24 DIAGNOSIS — M6281 Muscle weakness (generalized): Secondary | ICD-10-CM

## 2018-09-24 DIAGNOSIS — R279 Unspecified lack of coordination: Secondary | ICD-10-CM

## 2018-09-24 DIAGNOSIS — R293 Abnormal posture: Secondary | ICD-10-CM

## 2018-09-24 NOTE — Patient Instructions (Addendum)
Progression for loading the R ankle   Letter T, [place] on one leg with band band under L foot, wrap by big toe then, outer knee/ thigh, L hand pulling ( elbow by side)  Plant ballmound, toes spread, thigh out against band,, tracking knee in line with 2-3rd toe line Dipping forward, R foot lifts slight ( do not tetter forward too much , focus on rolling R hip back to keep knee out, spread toes, to lift arch.   10 x 2  Both    Slow step down   focus on rolling R hip back to keep knee out,  ballmounds first, slowly the heel down  20 reps    After hiking: self- mob of the navicular bone: Against the wall, pressure into R sitting bones, heel Swipe pressure across ballmound to pinky side,    On Bosu ball: -break up the hopping exercise  ballmound on center ring ( COM is more forward)  Swipe towards pinky toe side of ballmound  On opp hand on wall Instead do lateral step ups 30 reps   Same hand as the step up forward 30 reps   Tampoline marching, wide stance  Swipe towards pinky toe side of ballmound

## 2018-09-24 NOTE — Therapy (Signed)
[place] MAIN [facility] SERVICES [place], [place], [address] Phone: [phone]   Fax:  [phone]  Physical Therapy Treatment / Progress Note   Patient Details  Name: Jeffrey Whitaker MRN: [id number] Date of Birth: [birth year] Referring Provider (PT): [name] MD   Encounter Date: 09/24/2018  PT End of Session - 09/24/18 1146    Visit Number  8    Number of Visits  22    Date for PT Re-Evaluation  12/03/18    PT Start Time  1105    PT Stop Time  1147    PT Time Calculation (min)  42 min    Activity Tolerance  Patient tolerated treatment well    Behavior During Therapy  [facility] for tasks assessed/performed       Past Medical History:  Diagnosis Date  . Elevated PSA   . Hypertension   . Prostate cancer ([place]) 2019  . Umbilical hernia 4650    Past Surgical History:  Procedure Laterality Date  . COLONOSCOPY    . KNEE SURGERY Right    at age 89; clean out  . PELVIC LYMPH NODE DISSECTION Bilateral 04/26/2018   Procedure: PELVIC LYMPH NODE DISSECTION;  Surgeon: [name], MD;  Location: [facility] ORS;  Service: Urology;  Laterality: Bilateral;  . ROBOT ASSISTED LAPAROSCOPIC RADICAL PROSTATECTOMY N/A 04/26/2018   Procedure: ROBOTIC ASSISTED LAPAROSCOPIC RADICAL PROSTATECTOMY;  Surgeon: [name], MD;  Location: [facility] ORS;  Service: Urology;  Laterality: N/A;    There were no vitals filed for this visit.  Subjective Assessment - 09/24/18 1108    Subjective  Pt is noticing he is balancing better in his R foot    Pertinent History  Denied LBP, injuries to knees, ankle, feet.  R knee surgery at the age 22-25 year old.     Patient Stated Goals  get back into the routine with exercises after surgery         [facility] PT Assessment - 09/24/18 1125      Observation/Other Assessments   Observations  step down: genu valgus/ pronation .  pronation of R ankle with more anterior COM in SLS RDL                     [facility] Adult PT Treatment/Exercise - 09/24/18 1123      Neuro Re-ed    Neuro Re-ed Details   Cued for more Supination/ INV at Subtalar joint R in resistance band exercise , cued for posterior rotation of R pelvis and abd of patella, Cued for slower descent on step down to minimize pronation.                 PT Short Term Goals - 04/05/18 2001      PT SHORT TERM GOAL #1   Title  Pt wil demo proper body mechanics to minimize straining of abdomen and pelvic floor ( lifting dumbbells, kettlebells, stand<> floor and sit to stand t/f and lifting 40 lb back bag from ground to shoulders)     Time  4    Period  Weeks    Status  Achieved      PT SHORT TERM GOAL #2   Title  Pt will demo proper deep core coordination with proper diaphragmatic excursion in order to optimize postural stability and pelvic floor function    Time  2    Period  Weeks    Status  Achieved      PT  SHORT TERM GOAL #3   Title  Pt will demo IND with flexibility HEP w/ modificaitons to R knee limited ROM to utilize during 77 miles hiking trip in order to maintain proper pelvic floor ROM and minimize overactivity of lower kinetic chain      Time  4    Period  Weeks    Status  Achieved        PT Long Term Goals - 09/24/18 1133      PT LONG TERM GOAL #1   Title  Pt will decrease his IPSS score from 6 pts ( mild)  to < 3 pts and QOL score 2 pts to 0 pts in order to improve pelvic function  ( 2/28: 0pts)     Time  12    Period  Weeks    Status  Achieved      PT LONG TERM GOAL #2   Title  Pt will demo decrease his abdominal separation from 3 fingers below sternum to < 2 fingers in order to improve intraabdominal pressure for improve pelvic floor function    Time  10    Period  Weeks    Status  Achieved      PT LONG TERM GOAL #3   Title  Pt will increase R QL mm mass and strengthen and demo no lumbopelvic perturbations with L hip ext MMT  in order to minimize imbalances 2/2 R knee  limitation and minimize risks for injuries with fitness routines.      Time  6    Period  Weeks    Status  Achieved      PT LONG TERM GOAL #4   Title  Pt will demo proper alignment and technique with weight lifting, body weight training and fitness routine to minimzie risk for worsening of incontinence    Time  10    Period  Weeks    Status  Achieved      PT LONG TERM GOAL #5   Title  Pt will decrease pad wear from 2 to 0 in order to  improve QOL     Time  6    Period  Weeks    Status  Achieved      Additional Long Term Goals   Additional Long Term Goals  Yes      PT LONG TERM GOAL #6   Title  Pt will demo improved R PF from 5 reps Grade 3/5 to Grade 4/5 > 10 reps and improved SLS on R with more stability for 30 sec in order to perform fitness exercises and minimize risks for falls   ( 2/28: 7 reps MMT 3/5),     Time  8    Period  Weeks    Status  Partially Met      PT LONG TERM GOAL #7   Title  Pt will demo no medial collapse of arch with step down from a 7" stool to be safer with descending on trails when hiking     Time  10    Period  Weeks    Status  New    Target Date  12/03/18            Plan - 09/24/18 1147    Clinical Impression Statement  Pt has achieved 5/7 goals with no more leakage all together, including when weight lifting and exercising. Pt's R knee limitations have contributed to R gen valgus/ medial collapse of R arch which in turn has lead  to gait and balance deficits. Pt's lower kinetic chain has been addressed manual Tx and pt demonstrates today significantly improved mid/hind foot mobility, higher R plantar arch, less R genu valgus, more co-activation of hip mm /instrinsic foot muscles.  Today achieved increased reps with plantarflexion with single UE and progressed to upright loading exercises on ankle with less medial collpase. Pt still required verbal  and tactile cues maintain proper alignment at ankle/knee/and hips to minimize genu valgus/ medial  collapse of ankle.  Progressed pt to step/up and down with propioception training, marching on trampline, and chunking up BOsu ball exercise with trainer. Anticipate these modifications and progression witll help pt minimize injuries when hiking across elevation changes and rugged terrain.   Pt continues to benefit from skilled PT with once a month frequency to be prepared for his next long hiking trip.       Rehab Potential  Good    PT Frequency  1x / week    PT Duration  12 weeks    PT Treatment/Interventions  Balance training;Therapeutic exercise;Neuromuscular re-education;Stair training;Gait training;Moist Heat;Patient/family education;Therapeutic activities;Functional mobility training;Manual techniques;Biofeedback;Dry needling;Scar mobilization;Taping;Electrical Stimulation;Energy conservation    Consulted and Agree with Plan of Care  Patient       Patient will benefit from skilled therapeutic intervention in order to improve the following deficits and impairments:  Improper body mechanics, Increased muscle spasms, Postural dysfunction, Decreased coordination, Decreased mobility, Decreased range of motion, Decreased endurance, Difficulty walking, Decreased strength, Abnormal gait  Visit Diagnosis: Muscle weakness (generalized)  Abnormal posture  Unspecified lack of coordination     Problem List Patient Active Problem List   Diagnosis Date Noted  . Prostate cancer ([place]) 04/26/2018  . Allergic rhinitis, seasonal 04/15/2015  . Abnormal blood sugar 04/11/2015  . Benign hypertension 04/11/2015  . Dyslipidemia 04/11/2015  . Obesity (BMI 30.0-34.9) 04/11/2015  . Right medial knee pain 04/11/2015  . Metabolic syndrome 38/18/2993  . Left shoulder pain 04/11/2015  . Umbilical hernia 71/69/6789  . Microcytosis 04/11/2015    Jerl Mina ,PT, DPT, E-RYT  09/24/2018, 12:03 PM  [place] MAIN [facility] SERVICES [address] [place], [place],  [address] Phone: [phone]   Fax:  [phone]  Name: Jeffrey Whitaker MRN: [id number] Date of Birth: 13-Oct-1960

## 2018-10-04 ENCOUNTER — Encounter: Payer: 59 | Admitting: Physical Therapy

## 2018-10-20 ENCOUNTER — Encounter: Payer: Self-pay | Admitting: Physical Therapy

## 2018-10-25 ENCOUNTER — Encounter: Payer: 59 | Admitting: Physical Therapy

## 2018-11-08 ENCOUNTER — Encounter: Payer: 59 | Admitting: Physical Therapy

## 2018-11-18 ENCOUNTER — Other Ambulatory Visit: Payer: 59

## 2018-11-25 ENCOUNTER — Other Ambulatory Visit: Payer: Self-pay

## 2018-11-25 ENCOUNTER — Other Ambulatory Visit: Payer: 59

## 2018-11-25 DIAGNOSIS — C61 Malignant neoplasm of prostate: Secondary | ICD-10-CM

## 2018-11-26 LAB — PSA: Prostate Specific Ag, Serum: <0.1 ng/mL (ref 0.0–4.0)

## 2018-11-30 ENCOUNTER — Telehealth (INDEPENDENT_AMBULATORY_CARE_PROVIDER_SITE_OTHER): Payer: 59 | Admitting: Urology

## 2018-11-30 ENCOUNTER — Other Ambulatory Visit: Payer: Self-pay

## 2018-11-30 ENCOUNTER — Ambulatory Visit: Payer: 59 | Admitting: Urology

## 2018-11-30 ENCOUNTER — Telehealth: Payer: 59 | Admitting: Urology

## 2018-11-30 ENCOUNTER — Telehealth: Payer: Self-pay | Admitting: Urology

## 2018-11-30 DIAGNOSIS — C61 Malignant neoplasm of prostate: Secondary | ICD-10-CM | POA: Diagnosis not present

## 2018-11-30 DIAGNOSIS — N5231 Erectile dysfunction following radical prostatectomy: Secondary | ICD-10-CM

## 2018-11-30 DIAGNOSIS — N393 Stress incontinence (female) (male): Secondary | ICD-10-CM

## 2018-11-30 NOTE — Telephone Encounter (Signed)
Follow up appts made and mailed to patient   Jeffrey Whitaker

## 2018-11-30 NOTE — Progress Notes (Signed)
Virtual Visit via Telephone Note  I connected with Jeffrey Whitaker on 11/30/18 at  2:00 PM EDT by telephone and verified that I am speaking with the correct person using two identifiers.  Location: Patient: home ? Provider: home   I discussed the limitations, risks, security and privacy concerns of performing an evaluation and management service by telephone and the availability of in person appointments. I also discussed with the patient that there may be a patient responsible charge related to this service. The patient expressed understanding and agreed to proceed.   History of Present Illness: 58 year old male with personal history of prostate cancer status post radical prostatectomy returns today for routine 86-month follow-up via telephone.  s/p uncomplicated partial nerve sparing procedure on 04/26/2018.  Surgical pathology showed Gleason 3+4 prostate cancer without evidence of extracapsular extension.  Margins negative.  No seminal vesicle and bladder involvement.  Lymph nodes negative. pT2 N0.  PSA remains undetectable as of 5 days ago.  In terms of erections, he is been working with [facility].  He is tried Trimix up to 0.8 which does produce an erection but not satisfactory for penetration.  He was considering Edex but that is been on hold in light of COVID 19.  He is wondering if he needs to increase his dose or concentration of the medication.  No history of priapism.  He is able to inject without difficulty.  No penile pain associated with injections.  In terms of continence, he has no further leakage.  He is worked with physical therapy postoperatively and is very pleased with this particular outcome.   Observations/Objective: Pleasant, interactive  Assessment and Plan:  1. Prostate cancer [facility]) NED Recommend every 6 month PSAs for several years and then annually Patient understands agreeable this plan - PSA; Future - PSA; Future  2. Erectile dysfunction after  radical prostatectomy Will titrate up to super Trimix at compounding pharmacy, advised to use lower doses, start with 0.4 and then increase slowly up to 1 mL as needed We did discuss priapism precautions in detail especially when dose titrating He understands and is agreeable this plan  3. Stress incontinence of urine Resolved Continue to encourage pelvic floor exercises to maintain   Follow Up Instructions: 6 months PSA only, 1 year with PSA prior   I discussed the assessment and treatment plan with the patient. The patient was provided an opportunity to ask questions and all were answered. The patient agreed with the plan and demonstrated an understanding of the instructions.   The patient was advised to call back or seek an in-person evaluation if the symptoms worsen or if the condition fails to improve as anticipated.  I provided 23 minutes of non-face-to-face time during this encounter.   [name], MD

## 2018-12-02 ENCOUNTER — Encounter: Payer: Self-pay | Admitting: Urology

## 2018-12-13 ENCOUNTER — Encounter: Payer: Self-pay | Admitting: Physical Therapy

## 2018-12-13 DIAGNOSIS — M6281 Muscle weakness (generalized): Secondary | ICD-10-CM

## 2018-12-13 DIAGNOSIS — R279 Unspecified lack of coordination: Secondary | ICD-10-CM

## 2018-12-13 DIAGNOSIS — R293 Abnormal posture: Secondary | ICD-10-CM

## 2018-12-13 NOTE — Therapy (Signed)
[place] MAIN [facility] SERVICES [address] [place], [place], [address] Phone: [phone]   Fax:  [phone]  Patient Details  Name: Jeffrey Whitaker MRN: [id number] Date of Birth: 05-03-1961 Referring Provider:  Ernestine Conrad Encounter Date: 12/13/2018  Discharge Summary   Pt has achieved 100% of his goals with no more leakage in all functional, including when weight lifting and exercising. Pt's diastasis recti have improved which indicates improved intraabdominal pressure for more efficient pelvic floor and core function in functional and fitness tasks. Pt's R knee limitations have contributed to R genu valgus/ medial collapse of R arch which in turn has lead to gait and balance deficits. Pt's lower kinetic chain has been addressed manual Tx and pt demonstrates today significantly improved mid/hind foot mobility, higher R plantar arch, less R foot pronation / knee genu valgus, more co-activation of hip mm /instrinsic foot muscles. Pt is IND with stretching routine of back, hip, and legs mm. Pt has been able to return to long distance hikes without problems. Pt's balance in single leg stance has improved which decreases his risk for injuries in his fitness workouts and decreases his risks for overactivity of pelvic floor/ relapse of urinary Sx. Pt is ready for discharge at this time.    PT Long Term Goals - 09/24/18 1133      PT LONG TERM GOAL #1   Title  Pt will decrease his IPSS score from 6 pts ( mild)  to < 3 pts and QOL score 2 pts to 0 pts in order to improve pelvic function  ( 2/28: 0pts)     Time  12    Period  Weeks    Status  Achieved      PT LONG TERM GOAL #2   Title  Pt will demo decrease his abdominal separation from 3 fingers below sternum to < 2 fingers in order to improve intraabdominal pressure for improve pelvic floor function    Time  10    Period  Weeks    Status  Achieved      PT LONG TERM GOAL #3   Title  Pt will increase R QL mm  mass and strengthen and demo no lumbopelvic perturbations with L hip ext MMT  in order to minimize imbalances 2/2 R knee limitation and minimize risks for injuries with fitness routines.      Time  6    Period  Weeks    Status  Achieved      PT LONG TERM GOAL #4   Title  Pt will demo proper alignment and technique with weight lifting, body weight training and fitness routine to minimzie risk for worsening of incontinence    Time  10    Period  Weeks    Status  Achieved      PT LONG TERM GOAL #5   Title  Pt will decrease pad wear from 2 to 0 in order to  improve QOL     Time  6    Period  Weeks    Status  Achieved      Additional Long Term Goals   Additional Long Term Goals  Yes      PT LONG TERM GOAL #6   Title  Pt will demo improved R PF from 5 reps Grade 3/5 to Grade 4/5 > 10 reps and improved SLS on R with more stability for 30 sec in order to perform fitness exercises and minimize risks for falls   (  2/28: 7 reps MMT 3/5),     Time  8    Period  Weeks    Status  Achieved      PT LONG TERM GOAL #7   Title  Pt will demo no medial collapse of arch with step down from a 7" stool to be safer with descending on trails when hiking     Time  10    Period  Weeks    Status  Achieved    Target Date  12/03/18        Jerl Mina ,PT, DPT, E-RYT  12/13/2018, 9:16 AM  [place] [address] [place], [place], [address] Phone: [phone]   Fax:  [phone]

## 2019-02-11 ENCOUNTER — Telehealth: Payer: Self-pay

## 2019-02-11 DIAGNOSIS — N5231 Erectile dysfunction following radical prostatectomy: Secondary | ICD-10-CM

## 2019-02-11 MED ORDER — NONFORMULARY OR COMPOUNDED ITEM: 0 refills | Status: DC

## 2019-02-11 NOTE — Telephone Encounter (Signed)
Called [facility] to give refills on Trimix per Larene [place], per [place] the patient already has 3 refills left on medication. Pt informed.   Mr. Jeffrey Whitaker can have his refill on his super Trimix. Would you see if we can do 6 months of refills?  Thanks, Zara Council PA-C

## 2019-02-18 ENCOUNTER — Other Ambulatory Visit: Payer: Self-pay

## 2019-02-18 ENCOUNTER — Encounter: Payer: Self-pay | Admitting: Family Medicine

## 2019-02-18 ENCOUNTER — Ambulatory Visit (INDEPENDENT_AMBULATORY_CARE_PROVIDER_SITE_OTHER): Payer: 59 | Admitting: Family Medicine

## 2019-02-18 VITALS — BP 110/80 | HR 80 | Temp 97.3°F | Resp 16 | Ht 71.0 in | Wt 263.0 lb

## 2019-02-18 DIAGNOSIS — N529 Male erectile dysfunction, unspecified: Secondary | ICD-10-CM | POA: Diagnosis not present

## 2019-02-18 DIAGNOSIS — I10 Essential (primary) hypertension: Secondary | ICD-10-CM | POA: Diagnosis not present

## 2019-02-18 DIAGNOSIS — Z7189 Other specified counseling: Secondary | ICD-10-CM

## 2019-02-18 DIAGNOSIS — Z8546 Personal history of malignant neoplasm of prostate: Secondary | ICD-10-CM

## 2019-02-18 DIAGNOSIS — E8881 Metabolic syndrome: Secondary | ICD-10-CM | POA: Diagnosis not present

## 2019-02-18 DIAGNOSIS — E785 Hyperlipidemia, unspecified: Secondary | ICD-10-CM

## 2019-02-18 MED ORDER — QUINAPRIL-HYDROCHLOROTHIAZIDE 20-12.5 MG PO TABS: 1.0000 | ORAL_TABLET | Freq: Every day | ORAL | 1 refills | Status: DC

## 2019-02-18 MED ORDER — PRAVASTATIN SODIUM 40 MG PO TABS: 40.0000 mg | ORAL_TABLET | Freq: Every day | ORAL | 1 refills | Status: DC

## 2019-02-18 NOTE — Progress Notes (Signed)
Name: Jeffrey Whitaker   MRN: [id number]    DOB: 07-Sep-1960   Date:02/18/2019       Progress Note  Subjective  Chief Complaint  Chief Complaint  Patient presents with  . Hypertension  . Dyslipidemia    HPI  Metabolic Syndrome: on diet and exercise, denies polyphagia, polyuria or polydipsia, last hgbA1C6%, since last visit he has gained 16 lbs, we will recheck levels  HTN: he is taking medication as prescribed, denies chest pain or palpitation.BPis at goal, he denies side effects of medication. He feels well, denies dizziness.  Obesity: hehad lost weight with physical activity, however stopped because of prostate cancer treatment last Fall followed by COVID-19 and gyms closing.   History of prostate cancer: s/p surgery done 03/2018, under the care of Dr. Erlene Quan, he is doing well, he finished  pelvic floor PT and is doing well, incontinence is under control. Last PSA was normal , we will recheck it today   Patient Active Problem List   Diagnosis Date Noted  . Prostate cancer ([place]) 04/26/2018  . Allergic rhinitis, seasonal 04/15/2015  . Abnormal blood sugar 04/11/2015  . Benign hypertension 04/11/2015  . Dyslipidemia 04/11/2015  . Obesity (BMI 30.0-34.9) 04/11/2015  . Right medial knee pain 04/11/2015  . Metabolic syndrome 86/57/8469  . Left shoulder pain 04/11/2015  . Umbilical hernia 62/95/2841  . Microcytosis 04/11/2015    Past Surgical History:  Procedure Laterality Date  . COLONOSCOPY    . KNEE SURGERY Right    at age 85; clean out  . PELVIC LYMPH NODE DISSECTION Bilateral 04/26/2018   Procedure: PELVIC LYMPH NODE DISSECTION;  Surgeon: [name], MD;  Location: [facility] ORS;  Service: Urology;  Laterality: Bilateral;  . ROBOT ASSISTED LAPAROSCOPIC RADICAL PROSTATECTOMY N/A 04/26/2018   Procedure: ROBOTIC ASSISTED LAPAROSCOPIC RADICAL PROSTATECTOMY;  Surgeon: [name], MD;  Location: [facility] OR[facility];  Service: Urology;  Laterality: N/A;    Family History   Problem Relation Age of Onset  . Hypertension Mother   . CAD Mother   . Lung cancer Mother   . Benign prostatic hyperplasia Maternal Uncle   . Hypertension Sister   . Kidney disease Neg Hx   . Prostate cancer Neg Hx   . Bladder Cancer Neg Hx   . Kidney cancer Neg Hx     Social History   Socioeconomic History  . Marital status: Married    Spouse name: Santiago Glad  . Number of children: 2  . Years of education: Not on file  . Highest education level: Associate degree: occupational, Hotel manager, or vocational program  Occupational History  . Not on file  Social Needs  . Financial resource strain: Not hard at all  . Food insecurity    Worry: Never true    Inability: Never true  . Transportation needs    Medical: No    Non-medical: No  Tobacco Use  . Smoking status: Former Smoker    Years: 2.00    Types: Cigarettes    Start date: 07/28/1980    Quit date: 07/28/1982    Years since quitting: 36.5  . Smokeless tobacco: Former Systems developer    Quit date: 07/29/1987  Substance and Sexual Activity  . Alcohol use: Yes    Alcohol/week: 0.0 standard drinks    Comment: rarely  . Drug use: No  . Sexual activity: Yes    Partners: Female  Lifestyle  . Physical activity    Days per week: 4 days    Minutes per session: 30 min  .  Stress: Not at all  Relationships  . Social connections    Talks on phone: More than three times a week    Gets together: Twice a week    Attends religious service: Never    Active member of club or organization: No    Attends meetings of clubs or organizations: Never    Relationship status: Married  . Intimate partner violence    Fear of current or ex partner: No    Emotionally abused: No    Physically abused: No    Forced sexual activity: No  Other Topics Concern  . Not on file  Social History Narrative   Patient has 2 step-children     Current Outpatient Medications:  .  aspirin EC 81 MG tablet, Take 81 mg by mouth daily., Disp: , Rfl:  .  Multiple  Vitamins-Minerals (MENS 50+ MULTI VITAMIN/MIN PO), Take 1 tablet by mouth daily., Disp: , Rfl:  .  NONFORMULARY OR COMPOUNDED ITEM, Trimix (30/1/10)-(Pap/Phent/PGE) Dosage: 0.9 44mL vial Qty:10 Refills:6 [place] [phone] Fax [phone], Disp: 1 each, Rfl: 0 .  pravastatin (PRAVACHOL) 40 MG tablet, Take 1 tablet (40 mg total) by mouth daily., Disp: 90 tablet, Rfl: 1 .  quinapril-hydrochlorothiazide (ACCURETIC) 20-12.5 MG tablet, Take 1 tablet by mouth daily., Disp: 90 tablet, Rfl: 1 .  sildenafil (REVATIO) 20 MG tablet, Take 1 tablet (20 mg total) by mouth as needed. Take 1-5 tabs as needed prior to intercourse, Disp: 30 tablet, Rfl: 11  No Known Allergies  I personally reviewed active problem list, medication list, allergies, family history, social history with the patient/caregiver today.   ROS  Constitutional: Negative for fever, positive for  weight change.  Respiratory: Negative for cough and shortness of breath.   Cardiovascular: Negative for chest pain or palpitations.  Gastrointestinal: Negative for abdominal pain, no bowel changes.  Musculoskeletal: Negative for gait problem or joint swelling.  Skin: Negative for rash.  Neurological: Negative for dizziness or headache.  No other specific complaints in a complete review of systems (except as listed in HPI above).  Objective  Vitals:   02/18/19 0748  BP: 110/80  Pulse: 80  Resp: 16  Temp: (!) 97.3 F (36.3 C)  TempSrc: Oral  SpO2: 98%  Weight: 263 lb (119.3 kg)  Height: 5\' 11"  (1.803 m)    Body mass index is 36.68 kg/m.  Physical Exam  Constitutional: Patient appears well-developed and well-nourished. Obese  No distress.  HEENT: head atraumatic, normocephalic, pupils equal and reactive to light, neck supple Cardiovascular: Normal rate, regular rhythm and normal heart sounds.  No murmur heard. No BLE edema. Pulmonary/Chest: Effort normal and breath sounds normal. No respiratory distress. Abdominal:  Soft.  There is no tenderness. Psychiatric: Patient has a normal mood and affect. behavior is normal. Judgment and thought content normal.  Recent Results (from the past 2160 hour(s))  PSA     Status: None   Collection Time: 11/25/18  9:20 AM  Result Value Ref Range   Prostate Specific Ag, Serum <0.1 0.0 - 4.0 ng/mL    Comment: Roche ECLIA methodology. According to the American Urological Association, Serum PSA should decrease and remain at undetectable levels after radical prostatectomy. The AUA defines biochemical recurrence as an initial PSA value 0.2 ng/mL or greater followed by a subsequent confirmatory PSA value 0.2 ng/mL or greater. Values obtained with different assay methods or kits cannot be used interchangeably. Results cannot be interpreted as absolute evidence of the presence or absence of malignant disease.  PHQ2/9: Depression screen [employer] 2/9 02/18/2019 05/20/2018 02/10/2018 04/22/2017 10/17/2016  Decreased Interest 0 0 0 0 0  Down, Depressed, Hopeless 0 0 0 0 0  PHQ - 2 Score 0 0 0 0 0  Altered sleeping 0 0 0 - -  Tired, decreased energy 0 0 0 - -  Change in appetite 0 0 0 - -  Feeling bad or failure about yourself  0 0 0 - -  Trouble concentrating 0 0 0 - -  Moving slowly or fidgety/restless 0 0 0 - -  Suicidal thoughts 0 0 0 - -  PHQ-9 Score 0 0 0 - -  Difficult doing work/chores - Not difficult at all - - -    phq 9 is negative   Fall Risk: Fall Risk  02/18/2019 05/20/2018 02/10/2018 04/22/2017 10/17/2016  Falls in the past year? 0 No No No No  Number falls in past yr: 0 - - - -  Injury with Fall? 0 - - - -    Assessment & Plan   1. Benign hypertension  - quinapril-hydrochlorothiazide (ACCURETIC) 20-12.5 MG tablet; Take 1 tablet by mouth daily.  Dispense: 90 tablet; Refill: 1 - CBC with Differential/Platelet - Comprehensive metabolic panel  2. Dyslipidemia  - pravastatin (PRAVACHOL) 40 MG tablet; Take 1 tablet (40 mg total) by mouth daily.   Dispense: 90 tablet; Refill: 1 - Lipid panel  3. Metabolic syndrome  - Hemoglobin A1c  4. ED (erectile dysfunction) of organic origin  Using injections   5. History of prostate cancer  - PSA  6. Educated About Covid-19 Virus Infection  - SARS-CoV-2 Antibodies

## 2019-03-10 LAB — COMPREHENSIVE METABOLIC PANEL
ALT: 25 IU/L (ref 0–44)
AST: 20 IU/L (ref 0–40)
Albumin/Globulin Ratio: 1.7 (ref 1.2–2.2)
Albumin: 4.5 g/dL (ref 3.8–4.9)
Alkaline Phosphatase: 47 IU/L (ref 39–117)
BUN/Creatinine Ratio: 21 — ABNORMAL HIGH (ref 9–20)
BUN: 25 mg/dL — ABNORMAL HIGH (ref 6–24)
Bilirubin Total: 0.4 mg/dL (ref 0.0–1.2)
CO2: 23 mmol/L (ref 20–29)
Calcium: 9.8 mg/dL (ref 8.7–10.2)
Chloride: 105 mmol/L (ref 96–106)
Creatinine, Ser: 1.19 mg/dL (ref 0.76–1.27)
GFR calc Af Amer: 78 mL/min/{1.73_m2} (ref >59)
GFR calc non Af Amer: 67 mL/min/{1.73_m2} (ref >59)
Globulin, Total: 2.7 g/dL (ref 1.5–4.5)
Glucose: 96 mg/dL (ref 65–99)
Potassium: 4.3 mmol/L (ref 3.5–5.2)
Sodium: 143 mmol/L (ref 134–144)
Total Protein: 7.2 g/dL (ref 6.0–8.5)

## 2019-03-10 LAB — LIPID PANEL
Chol/HDL Ratio: 2.9 ratio (ref 0.0–5.0)
Cholesterol, Total: 140 mg/dL (ref 100–199)
HDL: 49 mg/dL (ref >39)
LDL Calculated: 76 mg/dL (ref 0–99)
Triglycerides: 73 mg/dL (ref 0–149)
VLDL Cholesterol Cal: 15 mg/dL (ref 5–40)

## 2019-03-10 LAB — CBC WITH DIFFERENTIAL/PLATELET
Basophils Absolute: 0.1 10*3/uL (ref 0.0–0.2)
Basos: 1 %
EOS (ABSOLUTE): 0.1 10*3/uL (ref 0.0–0.4)
Eos: 2 %
Hematocrit: 44 % (ref 37.5–51.0)
Hemoglobin: 13.7 g/dL (ref 13.0–17.7)
Immature Grans (Abs): 0 10*3/uL (ref 0.0–0.1)
Immature Granulocytes: 1 %
Lymphocytes Absolute: 1.1 10*3/uL (ref 0.7–3.1)
Lymphs: 20 %
MCH: 23.7 pg — ABNORMAL LOW (ref 26.6–33.0)
MCHC: 31.1 g/dL — ABNORMAL LOW (ref 31.5–35.7)
MCV: 76 fL — ABNORMAL LOW (ref 79–97)
Monocytes Absolute: 0.5 10*3/uL (ref 0.1–0.9)
Monocytes: 8 %
Neutrophils Absolute: 4.1 10*3/uL (ref 1.4–7.0)
Neutrophils: 68 %
Platelets: 187 10*3/uL (ref 150–450)
RBC: 5.79 x10E6/uL (ref 4.14–5.80)
RDW: 13.9 % (ref 11.6–15.4)
WBC: 5.8 10*3/uL (ref 3.4–10.8)

## 2019-03-10 LAB — HEMOGLOBIN A1C
Est. average glucose Bld gHb Est-mCnc: 131 mg/dL
Hgb A1c MFr Bld: 6.2 % — ABNORMAL HIGH (ref 4.8–5.6)

## 2019-03-10 LAB — PSA: Prostate Specific Ag, Serum: <0.1 ng/mL (ref 0.0–4.0)

## 2019-03-10 LAB — SARS-COV-2 ANTIBODIES: SARS-CoV-2 Antibodies: NEGATIVE

## 2019-05-03 ENCOUNTER — Ambulatory Visit (INDEPENDENT_AMBULATORY_CARE_PROVIDER_SITE_OTHER): Payer: 59 | Admitting: Family Medicine

## 2019-05-03 ENCOUNTER — Encounter: Payer: Self-pay | Admitting: Family Medicine

## 2019-05-03 ENCOUNTER — Other Ambulatory Visit: Payer: Self-pay

## 2019-05-03 VITALS — BP 136/72 | HR 87 | Temp 96.9°F | Resp 16 | Ht 70.0 in | Wt 267.4 lb

## 2019-05-03 DIAGNOSIS — Z23 Encounter for immunization: Secondary | ICD-10-CM | POA: Diagnosis not present

## 2019-05-03 DIAGNOSIS — Z0000 Encounter for general adult medical examination without abnormal findings: Secondary | ICD-10-CM

## 2019-05-03 NOTE — Patient Instructions (Addendum)
Please research GLP-1 agonist such as Ozempic/trulicity/victoza, the weight loss version of this class is called [place]. Oral form is Rybelsus   Check coverage of Shingrix vaccine   Preventive Care 108-58 Years Old, Male Preventive care refers to lifestyle choices and visits with your health care provider that can promote health and wellness. This includes:  A yearly physical exam. This is also called an annual well check.  Regular dental and eye exams.  Immunizations.  Screening for certain conditions.  Healthy lifestyle choices, such as eating a healthy diet, getting regular exercise, not using drugs or products that contain nicotine and tobacco, and limiting alcohol use. What can I expect for my preventive care visit? Physical exam Your health care provider will check:  Height and weight. These may be used to calculate body mass index (BMI), which is a measurement that tells if you are at a healthy weight.  Heart rate and blood pressure.  Your skin for abnormal spots. Counseling Your health care provider may ask you questions about:  Alcohol, tobacco, and drug use.  Emotional well-being.  Home and relationship well-being.  Sexual activity.  Eating habits.  Work and work Statistician. What immunizations do I need?  Influenza (flu) vaccine  This is recommended every year. Tetanus, diphtheria, and pertussis (Tdap) vaccine  You may need a Td booster every 10 years. Varicella (chickenpox) vaccine  You may need this vaccine if you have not already been vaccinated. Zoster (shingles) vaccine  You may need this after age 32. Measles, mumps, and rubella (MMR) vaccine  You may need at least one dose of MMR if you were born in 1957 or later. You may also need a second dose. Pneumococcal conjugate (PCV13) vaccine  You may need this if you have certain conditions and were not previously vaccinated. Pneumococcal polysaccharide (PPSV23) vaccine  You may need one or  two doses if you smoke cigarettes or if you have certain conditions. Meningococcal conjugate (MenACWY) vaccine  You may need this if you have certain conditions. Hepatitis A vaccine  You may need this if you have certain conditions or if you travel or work in places where you may be exposed to hepatitis A. Hepatitis B vaccine  You may need this if you have certain conditions or if you travel or work in places where you may be exposed to hepatitis B. Haemophilus influenzae type b (Hib) vaccine  You may need this if you have certain risk factors. Human papillomavirus (HPV) vaccine  If recommended by your health care provider, you may need three doses over 6 months. You may receive vaccines as individual doses or as more than one vaccine together in one shot (combination vaccines). Talk with your health care provider about the risks and benefits of combination vaccines. What tests do I need? Blood tests  Lipid and cholesterol levels. These may be checked every 5 years, or more frequently if you are over 82 years old.  Hepatitis C test.  Hepatitis B test. Screening  Lung cancer screening. You may have this screening every year starting at age 33 if you have a 30-pack-year history of smoking and currently smoke or have quit within the past 15 years.  Prostate cancer screening. Recommendations will vary depending on your family history and other risks.  Colorectal cancer screening. All adults should have this screening starting at age 48 and continuing until age 19. Your health care provider may recommend screening at age 44 if you are at increased risk. You will have tests  every 1-10 years, depending on your results and the type of screening test.  Diabetes screening. This is done by checking your blood sugar (glucose) after you have not eaten for a while (fasting). You may have this done every 1-3 years.  Sexually transmitted disease (STD) testing. Follow these instructions at home:  Eating and drinking  Eat a diet that includes fresh fruits and vegetables, whole grains, lean protein, and low-fat dairy products.  Take vitamin and mineral supplements as recommended by your health care provider.  Do not drink alcohol if your health care provider tells you not to drink.  If you drink alcohol: ? Limit how much you have to 0-2 drinks a day. ? Be aware of how much alcohol is in your drink. In the [place] one drink equals one 12 oz bottle of beer (355 mL), one 5 oz glass of wine (148 mL), or one 1 oz glass of hard liquor (44 mL). Lifestyle  Take daily care of your teeth and gums.  Stay active. Exercise for at least 30 minutes on 5 or more days each week.  Do not use any products that contain nicotine or tobacco, such as cigarettes, e-cigarettes, and chewing tobacco. If you need help quitting, ask your health care provider.  If you are sexually active, practice safe sex. Use a condom or other form of protection to prevent STIs (sexually transmitted infections).  Talk with your health care provider about taking a low-dose aspirin every day starting at age 47. What's next?  Go to your health care provider once a year for a well check visit.  Ask your health care provider how often you should have your eyes and teeth checked.  Stay up to date on all vaccines. This information is not intended to replace advice given to you by your health care provider. Make sure you discuss any questions you have with your health care provider. Document Released: 08/10/2015 Document Revised: 07/08/2018 Document Reviewed: 07/08/2018 Elsevier Patient Education  2020 [employer].

## 2019-05-03 NOTE — Progress Notes (Signed)
Name: Jeffrey Whitaker   MRN: [id number]    DOB: 01-04-1961   Date:05/03/2019       Progress Note  Subjective  Chief Complaint  Chief Complaint  Patient presents with  . Encounter for routine history and physical exam for male    HPI  Patient presents for annual CPE   USPSTF grade A and B recommendations:  Diet: balanced diet, not doing good with portion size  Exercise: he is hiking again, also working out with trainer 3 times a week   Depression: phq 9 is negative Depression screen [facility] 2/9 05/03/2019 02/18/2019 05/20/2018 02/10/2018 04/22/2017  Decreased Interest 0 0 0 0 0  Down, Depressed, Hopeless 0 0 0 0 0  PHQ - 2 Score 0 0 0 0 0  Altered sleeping 0 0 0 0 -  Tired, decreased energy 0 0 0 0 -  Change in appetite 0 0 0 0 -  Feeling bad or failure about yourself  0 0 0 0 -  Trouble concentrating 0 0 0 0 -  Moving slowly or fidgety/restless 0 0 0 0 -  Suicidal thoughts 0 0 0 0 -  PHQ-9 Score 0 0 0 0 -  Difficult doing work/chores Not difficult at all - Not difficult at all - -    Hypertension:  BP Readings from Last 3 Encounters:  05/03/19 136/72  02/18/19 110/80  08/27/18 112/72    Obesity: Wt Readings from Last 3 Encounters:  05/03/19 267 lb 6.4 oz (121.3 kg)  02/18/19 263 lb (119.3 kg)  08/27/18 245 lb 6.4 oz (111.3 kg)   BMI Readings from Last 3 Encounters:  05/03/19 38.37 kg/m  02/18/19 36.68 kg/m  08/27/18 34.23 kg/m     Lipids:  Lab Results  Component Value Date   CHOL 140 03/09/2019   CHOL 150 02/18/2018   CHOL 135 10/18/2016   Lab Results  Component Value Date   HDL 49 03/09/2019   HDL 53 02/18/2018   HDL 51 10/17/2016   Lab Results  Component Value Date   LDLCALC 76 03/09/2019   LDLCALC 82 02/18/2018   LDLCALC 74 10/17/2016   Lab Results  Component Value Date   TRIG 73 03/09/2019   TRIG 77 02/18/2018   TRIG 49 10/17/2016   Lab Results  Component Value Date   CHOLHDL 2.9 03/09/2019   CHOLHDL 2.8 02/18/2018   CHOLHDL 2.6  10/17/2016   No results found for: LDLDIRECT Glucose:  Glucose  Date Value Ref Range Status  03/09/2019 96 65 - 99 mg/dL Final  08/27/2018 98 65 - 99 mg/dL Final  02/18/2018 95 65 - 99 mg/dL Final   Glucose, Bld  Date Value Ref Range Status  04/27/2018 130 (H) 70 - 99 mg/dL Final  04/19/2018 102 (H) 70 - 99 mg/dL Final      Office Visit from 05/03/2019 in [facility]  AUDIT-C Score  1      Married STD testing and prevention (HIV/chl/gon/syphilis): N/A Hep C: we will check during next lab drawn   Skin cancer: discussed atypical lesions  Colorectal cancer: repeat in 20124  Prostate cancer: s/p prostatectomy   Lab Results  Component Value Date   PSA 3.2 12/12/2014    IPSS Questionnaire (AUA-7): Over the past month.   1)  How often have you had a sensation of not emptying your bladder completely after you finish urinating?  0 - Not at all  2)  How often have you had to urinate again  less than two hours after you finished urinating? 0 - Not at all  3)  How often have you found you stopped and started again several times when you urinated?  0 - Not at all  4) How difficult have you found it to postpone urination?  0 - Not at all  5) How often have you had a weak urinary stream?  0 - Not at all  6) How often have you had to push or strain to begin urination?  0 - Not at all  7) How many times did you most typically get up to urinate from the time you went to bed until the time you got up in the morning?  1 - 1 time  Total score:  0-7 mildly symptomatic   8-19 moderately symptomatic   20-35 severely symptomatic    Lung cancer:   Low Dose CT Chest recommended if Age 65-80 years, 30 pack-year currently smoking OR have quit w/in 15years. Patient does not qualify.   AAA:  The USPSTF recommends one-time screening with ultrasonography in men ages 51 to 65 years who have ever smoked, we will check when he is 72  ECG:  03/2018   Advanced Care Planning: A  voluntary discussion about advance care planning including the explanation and discussion of advance directives.  Discussed health care proxy and Living will, and the patient was able to identify a health care proxy as wife .  Patient does have a living will at present time.  Patient Active Problem List   Diagnosis Date Noted  . Allergic rhinitis, seasonal 04/15/2015  . Abnormal blood sugar 04/11/2015  . Benign hypertension 04/11/2015  . Dyslipidemia 04/11/2015  . Obesity (BMI 30.0-34.9) 04/11/2015  . Right medial knee pain 04/11/2015  . Metabolic syndrome 99991111  . Left shoulder pain 04/11/2015  . Umbilical hernia 99991111  . Microcytosis 04/11/2015    Past Surgical History:  Procedure Laterality Date  . COLONOSCOPY    . KNEE SURGERY Right    at age 10; clean out  . PELVIC LYMPH NODE DISSECTION Bilateral 04/26/2018   Procedure: PELVIC LYMPH NODE DISSECTION;  Surgeon: [name], MD;  Location: [facility] ORS;  Service: Urology;  Laterality: Bilateral;  . ROBOT ASSISTED LAPAROSCOPIC RADICAL PROSTATECTOMY N/A 04/26/2018   Procedure: ROBOTIC ASSISTED LAPAROSCOPIC RADICAL PROSTATECTOMY;  Surgeon: [name], MD;  Location: [facility] ORS;  Service: Urology;  Laterality: N/A;    Family History  Problem Relation Age of Onset  . Hypertension Mother   . CAD Mother   . Lung cancer Mother   . Benign prostatic hyperplasia Maternal Uncle   . Prostate cancer Maternal Uncle   . Hypertension Sister   . Dementia Maternal Grandfather   . Cancer Paternal Grandmother        Think stomach or intestinal  . Kidney disease Neg Hx   . Bladder Cancer Neg Hx   . Kidney cancer Neg Hx     Social History   Socioeconomic History  . Marital status: Married    Spouse name: Santiago Glad  . Number of children: 2  . Years of education: Not on file  . Highest education level: Associate degree: occupational, Hotel manager, or vocational program  Occupational History  . Not on file  Social Needs  . Financial  resource strain: Not hard at all  . Food insecurity    Worry: Never true    Inability: Never true  . Transportation needs    Medical: No    Non-medical: No  Tobacco  Use  . Smoking status: Former Smoker    Years: 2.00    Types: Cigarettes    Start date: 07/28/1980    Quit date: 07/28/1982    Years since quitting: 36.7  . Smokeless tobacco: Former Systems developer    Types: [place] date: 07/29/1987  Substance and Sexual Activity  . Alcohol use: Yes    Alcohol/week: 0.0 standard drinks    Comment: rarely  . Drug use: No  . Sexual activity: Yes    Partners: Female  Lifestyle  . Physical activity    Days per week: 3 days    Minutes per session: 60 min  . Stress: Not at all  Relationships  . Social connections    Talks on phone: More than three times a week    Gets together: Twice a week    Attends religious service: Never    Active member of club or organization: No    Attends meetings of clubs or organizations: Never    Relationship status: Married  . Intimate partner violence    Fear of current or ex partner: No    Emotionally abused: No    Physically abused: No    Forced sexual activity: No  Other Topics Concern  . Not on file  Social History Narrative   Patient has 2 step-children     Current Outpatient Medications:  .  aspirin EC 81 MG tablet, Take 81 mg by mouth daily., Disp: , Rfl:  .  Multiple Vitamins-Minerals (MENS 50+ MULTI VITAMIN/MIN PO), Take 1 tablet by mouth daily., Disp: , Rfl:  .  NONFORMULARY OR COMPOUNDED ITEM, Trimix (30/1/10)-(Pap/Phent/PGE) Dosage: 0.9 3mL vial Qty:10 Refills:6 [facility] [phone] Fax [phone], Disp: 1 each, Rfl: 0 .  pravastatin (PRAVACHOL) 40 MG tablet, Take 1 tablet (40 mg total) by mouth daily., Disp: 90 tablet, Rfl: 1 .  quinapril-hydrochlorothiazide (ACCURETIC) 20-12.5 MG tablet, Take 1 tablet by mouth daily., Disp: 90 tablet, Rfl: 1  No Known Allergies   ROS  Constitutional: Negative for fever or significant  weight change.  Respiratory: Negative for cough and shortness of breath.   Cardiovascular: Negative for chest pain or palpitations.  Gastrointestinal: Negative for abdominal pain, no bowel changes.  Musculoskeletal: Negative for gait problem or joint swelling.  Skin: Negative for rash.  Neurological: Negative for dizziness or headache.  No other specific complaints in a complete review of systems (except as listed in HPI above).   Objective  Vitals:   05/03/19 1521  BP: 136/72  Pulse: 87  Resp: 16  Temp: (!) 96.9 F (36.1 C)  TempSrc: Temporal  SpO2: 99%  Weight: 267 lb 6.4 oz (121.3 kg)  Height: 5\' 10"  (1.778 m)    Body mass index is 38.37 kg/m.  Physical Exam  Constitutional: Patient appears well-developed and obese . No distress.  HENT: Head: Normocephalic and atraumatic. Ears: B TMs ok, no erythema or effusion; Nose: Nose normal. Mouth/Throat: not recently  Eyes: Conjunctivae and EOM are normal. Pupils are equal, round, and reactive to light. No scleral icterus.  Neck: Normal range of motion. Neck supple. No JVD present. No thyromegaly present.  Cardiovascular: Normal rate, regular rhythm and normal heart sounds.  No murmur heard. No BLE edema. Pulmonary/Chest: Effort normal and breath sounds normal. No respiratory distress. Abdominal: Soft. Bowel sounds are normal, no distension. There is no tenderness. no masses MALE GENITALIA:not done RECTAL:not done sees Urologist  Musculoskeletal: Normal range of motion, no joint effusions. No gross deformities Neurological:  he is alert and oriented to person, place, and time. No cranial nerve deficit. Coordination, balance, strength, speech and gait are normal.  Skin: Skin is warm and dry. No rash noted. No erythema.  Psychiatric: Patient has a normal mood and affect. behavior is normal. Judgment and thought content normal.  Recent Results (from the past 2160 hour(s))  CBC with Differential/Platelet     Status: Abnormal    Collection Time: 03/09/19 12:06 PM  Result Value Ref Range   WBC 5.8 3.4 - 10.8 x10E3/uL   RBC 5.79 4.14 - 5.80 x10E6/uL   Hemoglobin 13.7 13.0 - 17.7 g/dL   Hematocrit 44.0 37.5 - 51.0 %   MCV 76 (L) 79 - 97 fL   MCH 23.7 (L) 26.6 - 33.0 pg   MCHC 31.1 (L) 31.5 - 35.7 g/dL   RDW 13.9 11.6 - 15.4 %   Platelets 187 150 - 450 x10E3/uL   Neutrophils 68 Not Estab. %   Lymphs 20 Not Estab. %   Monocytes 8 Not Estab. %   Eos 2 Not Estab. %   Basos 1 Not Estab. %   Neutrophils Absolute 4.1 1.4 - 7.0 x10E3/uL   Lymphocytes Absolute 1.1 0.7 - 3.1 x10E3/uL   Monocytes Absolute 0.5 0.1 - 0.9 x10E3/uL   EOS (ABSOLUTE) 0.1 0.0 - 0.4 x10E3/uL   Basophils Absolute 0.1 0.0 - 0.2 x10E3/uL   Immature Granulocytes 1 Not Estab. %   Immature Grans (Abs) 0.0 0.0 - 0.1 x10E3/uL  Lipid panel     Status: None   Collection Time: 03/09/19 12:06 PM  Result Value Ref Range   Cholesterol, Total 140 100 - 199 mg/dL   Triglycerides 73 0 - 149 mg/dL   HDL 49 >39 mg/dL   VLDL Cholesterol Cal 15 5 - 40 mg/dL   LDL Calculated 76 0 - 99 mg/dL   Chol/HDL Ratio 2.9 0.0 - 5.0 ratio    Comment:                                   T. Chol/HDL Ratio                                             Men  Women                               1/2 Avg.Risk  3.4    3.3                                   Avg.Risk  5.0    4.4                                2X Avg.Risk  9.6    7.1                                3X Avg.Risk 23.4   11.0   Hemoglobin A1c     Status: Abnormal   Collection Time: 03/09/19 12:06 PM  Result Value Ref Range   Hgb A1c MFr Bld 6.2 (H)  4.8 - 5.6 %    Comment:          Prediabetes: 5.7 - 6.4          Diabetes: >6.4          Glycemic control for adults with diabetes: <7.0    Est. average glucose Bld gHb Est-mCnc 131 mg/dL  Comprehensive metabolic panel     Status: Abnormal   Collection Time: 03/09/19 12:06 PM  Result Value Ref Range   Glucose 96 65 - 99 mg/dL   BUN 25 (H) 6 - 24 mg/dL   Creatinine, Ser  1.19 0.76 - 1.27 mg/dL   GFR calc non Af Amer 67 >59 mL/min/1.73   GFR calc Af Amer 78 >59 mL/min/1.73   BUN/Creatinine Ratio 21 (H) 9 - 20   Sodium 143 134 - 144 mmol/L   Potassium 4.3 3.5 - 5.2 mmol/L   Chloride 105 96 - 106 mmol/L   CO2 23 20 - 29 mmol/L   Calcium 9.8 8.7 - 10.2 mg/dL   Total Protein 7.2 6.0 - 8.5 g/dL   Albumin 4.5 3.8 - 4.9 g/dL   Globulin, Total 2.7 1.5 - 4.5 g/dL   Albumin/Globulin Ratio 1.7 1.2 - 2.2   Bilirubin Total 0.4 0.0 - 1.2 mg/dL   Alkaline Phosphatase 47 39 - 117 IU/L   AST 20 0 - 40 IU/L   ALT 25 0 - 44 IU/L  SARS-CoV-2 Antibodies     Status: None   Collection Time: 03/09/19 12:06 PM  Result Value Ref Range   SARS-CoV-2 Antibodies Negative Negative    Comment: This sample does not contain detectable SARS-CoV-2 antibodies. This negative result does not rule out SARS-CoV-2 infection. Correlation with epidemiologic risk factors and other clinical and laboratory findings is recommended. Serologic results should not be used as the sole basis to diagnose or exclude recent SARS-CoV-2 infection.   PSA     Status: None   Collection Time: 03/09/19 12:06 PM  Result Value Ref Range   Prostate Specific Ag, Serum <0.1 0.0 - 4.0 ng/mL    Comment: Roche ECLIA methodology. According to the American Urological Association, Serum PSA should decrease and remain at undetectable levels after radical prostatectomy. The AUA defines biochemical recurrence as an initial PSA value 0.2 ng/mL or greater followed by a subsequent confirmatory PSA value 0.2 ng/mL or greater. Values obtained with different assay methods or kits cannot be used interchangeably. Results cannot be interpreted as absolute evidence of the presence or absence of malignant disease.      PHQ2/9: Depression screen [facility] 2/9 05/03/2019 02/18/2019 05/20/2018 02/10/2018 04/22/2017  Decreased Interest 0 0 0 0 0  Down, Depressed, Hopeless 0 0 0 0 0  PHQ - 2 Score 0 0 0 0 0  Altered sleeping 0 0 0 0 -   Tired, decreased energy 0 0 0 0 -  Change in appetite 0 0 0 0 -  Feeling bad or failure about yourself  0 0 0 0 -  Trouble concentrating 0 0 0 0 -  Moving slowly or fidgety/restless 0 0 0 0 -  Suicidal thoughts 0 0 0 0 -  PHQ-9 Score 0 0 0 0 -  Difficult doing work/chores Not difficult at all - Not difficult at all - -    Fall Risk: Fall Risk  05/03/2019 02/18/2019 05/20/2018 02/10/2018 04/22/2017  Falls in the past year? 0 0 No No No  Number falls in past yr: 0 0 - - -  Injury with  Fall? 0 0 - - -     Functional Status Survey: Is the patient deaf or have difficulty hearing?: No Does the patient have difficulty seeing, even when wearing glasses/contacts?: Yes Does the patient have difficulty concentrating, remembering, or making decisions?: No Does the patient have difficulty walking or climbing stairs?: No Does the patient have difficulty dressing or bathing?: No Does the patient have difficulty doing errands alone such as visiting a doctor's office or shopping?: No    Assessment & Plan   1. Well adult exam  Discussed Shingrix   2. Need for influenza vaccination  - Flu Vaccine QUAD 6+ mos PF IM (Fluarix Quad PF)   -Prostate cancer screening and PSA options (with potential risks and benefits of testing vs not testing) were discussed along with recent recs/guidelines. -USPSTF grade A and B recommendations reviewed with patient; age-appropriate recommendations, preventive care, screening tests, etc discussed and encouraged; healthy living encouraged; see AVS for patient education given to patient -Discussed importance of 150 minutes of physical activity weekly, eat two servings of fish weekly, eat one serving of tree nuts ( cashews, pistachios, pecans, almonds.Marland Kitchen) every other day, eat 6 servings of fruit/vegetables daily and drink plenty of water and avoid sweet beverages.

## 2019-05-25 ENCOUNTER — Other Ambulatory Visit: Payer: Self-pay | Admitting: Urology

## 2019-05-25 ENCOUNTER — Telehealth: Payer: Self-pay

## 2019-05-25 DIAGNOSIS — N5231 Erectile dysfunction following radical prostatectomy: Secondary | ICD-10-CM

## 2019-05-25 MED ORDER — NONFORMULARY OR COMPOUNDED ITEM: 0 refills | Status: DC

## 2019-05-25 NOTE — Telephone Encounter (Signed)
[facility] called and said the new rx sent in was a lower dose than before.  Is this correct or does the does need to be the same as before.?

## 2019-05-26 MED ORDER — NONFORMULARY OR COMPOUNDED ITEM: 6 refills | Status: DC

## 2019-05-26 NOTE — Telephone Encounter (Signed)
Patient says he is taking super trimix and injects 0.35ml.  41ml was to much and made the erection last to long and was to hard.

## 2019-05-26 NOTE — Telephone Encounter (Signed)
New trimix rx printed and faxed to [facility].

## 2019-05-26 NOTE — Telephone Encounter (Signed)
Would you please send in Trimix (30 mg PAPA, 1 mL phen, 50 mg prostaglandin) for the patient?  He is injecting 0.7 mcg.

## 2019-05-26 NOTE — Telephone Encounter (Signed)
The last note from Dr. Erlene Quan stated he was injecting 0.4 mcg and was to slowly titrate up to 1 mL as needed.  We will need to ask Jeffrey Whitaker how much of the Trimix he is injecting at this time.

## 2019-06-01 ENCOUNTER — Other Ambulatory Visit: Payer: Self-pay

## 2019-06-02 ENCOUNTER — Telehealth: Payer: Self-pay

## 2019-06-02 ENCOUNTER — Other Ambulatory Visit: Payer: Self-pay

## 2019-06-02 ENCOUNTER — Other Ambulatory Visit: Payer: 59

## 2019-06-02 DIAGNOSIS — C61 Malignant neoplasm of prostate: Secondary | ICD-10-CM

## 2019-06-02 MED ORDER — NONFORMULARY OR COMPOUNDED ITEM: 6 refills | Status: DC

## 2019-06-02 NOTE — Telephone Encounter (Signed)
Per may office note patient was increased to super trimix.  New rx called into [facility].

## 2019-06-03 ENCOUNTER — Telehealth: Payer: Self-pay | Admitting: *Deleted

## 2019-06-03 LAB — PSA: Prostate Specific Ag, Serum: <0.1 ng/mL (ref 0.0–4.0)

## 2019-06-03 NOTE — Telephone Encounter (Addendum)
Patient informed-verbalized understanding  ----- Message from [name], MD sent at 06/03/2019  8:21 AM EST ----- PSA is undetectable.  [name], MD

## 2019-08-09 ENCOUNTER — Other Ambulatory Visit: Payer: Self-pay | Admitting: Family Medicine

## 2019-08-09 DIAGNOSIS — I10 Essential (primary) hypertension: Secondary | ICD-10-CM

## 2019-08-09 MED ORDER — QUINAPRIL-HYDROCHLOROTHIAZIDE 20-12.5 MG PO TABS: 1.0000 | ORAL_TABLET | Freq: Every day | ORAL | 0 refills | Status: DC

## 2019-08-09 NOTE — Telephone Encounter (Signed)
Copied from [place] [id number]. Topic: Quick Communication - Rx Refill/Question >> Aug 09, 2019 10:30 AM Yvette Rack wrote: Medication: quinapril-hydrochlorothiazide (ACCURETIC) 20-12.5 MG tablet  Has the patient contacted their pharmacy? yes   Preferred Pharmacy (with phone number or street name): [place], [place] [facility]  Phone: [phone]  Fax: [phone]  Agent: Please be advised that RX refills may take up to 3 business days. We ask that you follow-up with your pharmacy.

## 2019-08-23 ENCOUNTER — Other Ambulatory Visit: Payer: Self-pay

## 2019-08-23 ENCOUNTER — Ambulatory Visit (INDEPENDENT_AMBULATORY_CARE_PROVIDER_SITE_OTHER): Payer: 59 | Admitting: Family Medicine

## 2019-08-23 ENCOUNTER — Encounter: Payer: Self-pay | Admitting: Family Medicine

## 2019-08-23 VITALS — HR 72 | Wt 265.0 lb

## 2019-08-23 DIAGNOSIS — E669 Obesity, unspecified: Secondary | ICD-10-CM

## 2019-08-23 DIAGNOSIS — Z8546 Personal history of malignant neoplasm of prostate: Secondary | ICD-10-CM | POA: Diagnosis not present

## 2019-08-23 DIAGNOSIS — E8881 Metabolic syndrome: Secondary | ICD-10-CM | POA: Diagnosis not present

## 2019-08-23 DIAGNOSIS — I10 Essential (primary) hypertension: Secondary | ICD-10-CM

## 2019-08-23 DIAGNOSIS — E785 Hyperlipidemia, unspecified: Secondary | ICD-10-CM | POA: Diagnosis not present

## 2019-08-23 MED ORDER — PRAVASTATIN SODIUM 40 MG PO TABS: 40.0000 mg | ORAL_TABLET | Freq: Every day | ORAL | 1 refills | Status: DC

## 2019-08-23 MED ORDER — QUINAPRIL-HYDROCHLOROTHIAZIDE 20-12.5 MG PO TABS: 1.0000 | ORAL_TABLET | Freq: Every day | ORAL | 1 refills | Status: DC

## 2019-08-23 NOTE — Progress Notes (Signed)
Name: Jeffrey Whitaker   MRN: [id number]    DOB: Feb 03, 1961   Date:08/23/2019       Progress Note  Subjective  Chief Complaint  Chief Complaint  Patient presents with  . Hypertension    6 month recheck  . Hyperlipidemia    I connected with  Jeffrey Whitaker  on 08/23/19 at  7:40 AM EST by telephone encounter. I verified that I am speaking with the correct person using two identifiers.  I discussed the limitations of evaluation and management by telemedicine and the availability of in person appointments. The patient expressed understanding and agreed to proceed. Staff also discussed with the patient that there may be a patient responsible charge related to this service. Patient Location: at home  Provider Location: [facility]   HPI  Metabolic Syndrome: on diet and exercise, denies polyphagia, polyuria or polydipsia, last hgbA1C6.2%. His weight is stable since July 2020. He is still working with a trainer twice a week and adding a hike on the weekends  HTN: he is taking medication as prescribed, denies chest pain or palpitation.BPnot checked at home today,  he denies side effects of medication.   Obesity: his weight was down to 245 lbs in the beginning of 2020, but he gained weight after prostate surgery and also COVID-19 life style changes. He is trying to increase physical activity   History of prostate cancer: s/p surgery done 03/2018, under the care of Dr. Erlene Quan, he is doing well, he finished  pelvic floor PT but is still doing the exercises at home.  Last PSA was normal, staying below 0.1   Dyslipidemia: he is taking pravastatin, no myalgias, last LDL was 76, HDL was 49   Patient Active Problem List   Diagnosis Date Noted  . Allergic rhinitis, seasonal 04/15/2015  . Abnormal blood sugar 04/11/2015  . Benign hypertension 04/11/2015  . Dyslipidemia 04/11/2015  . Obesity (BMI 30.0-34.9) 04/11/2015  . Right medial knee pain 04/11/2015  . Metabolic  syndrome 99991111  . Left shoulder pain 04/11/2015  . Umbilical hernia 99991111  . Microcytosis 04/11/2015    Past Surgical History:  Procedure Laterality Date  . COLONOSCOPY    . KNEE SURGERY Right    at age 82; clean out  . PELVIC LYMPH NODE DISSECTION Bilateral 04/26/2018   Procedure: PELVIC LYMPH NODE DISSECTION;  Surgeon: [name], MD;  Location: [facility] ORS;  Service: Urology;  Laterality: Bilateral;  . ROBOT ASSISTED LAPAROSCOPIC RADICAL PROSTATECTOMY N/A 04/26/2018   Procedure: ROBOTIC ASSISTED LAPAROSCOPIC RADICAL PROSTATECTOMY;  Surgeon: [name], MD;  Location: [facility] ORS;  Service: Urology;  Laterality: N/A;    Family History  Problem Relation Age of Onset  . Hypertension Mother   . CAD Mother   . Lung cancer Mother   . Benign prostatic hyperplasia Maternal Uncle   . Prostate cancer Maternal Uncle   . Hypertension Sister   . Dementia Maternal Grandfather   . Cancer Paternal Grandmother        Think stomach or intestinal  . Kidney disease Neg Hx   . Bladder Cancer Neg Hx   . Kidney cancer Neg Hx       Current Outpatient Medications:  .  aspirin EC 81 MG tablet, Take 81 mg by mouth daily., Disp: , Rfl:  .  Multiple Vitamins-Minerals (MENS 50+ MULTI VITAMIN/MIN PO), Take 1 tablet by mouth daily., Disp: , Rfl:  .  NONFORMULARY OR COMPOUNDED ITEM, Trimix (30/2/20)-(Pap/Phent/PGE)  Dosage: Inject 0.7 cc per  injection  Vial 84ml  Qty #10 Refills 6  [place] [phone] Fax [phone], Disp: 10 each, Rfl: 6 .  pravastatin (PRAVACHOL) 40 MG tablet, Take 1 tablet (40 mg total) by mouth daily., Disp: 90 tablet, Rfl: 1 .  quinapril-hydrochlorothiazide (ACCURETIC) 20-12.5 MG tablet, Take 1 tablet by mouth daily., Disp: 90 tablet, Rfl: 0  No Known Allergies  I personally reviewed active problem list, medication list, allergies, family history, social history, health maintenance with the patient/caregiver today.   ROS  Ten systems reviewed and is  negative except as mentioned in HPI   Objective  Virtual encounter, vitals no obtained.  There is no height or weight on file to calculate BMI.  Physical Exam  Awake, alert and oriented   PHQ2/9: Depression screen [facility] 2/9 08/23/2019 05/03/2019 02/18/2019 05/20/2018 02/10/2018  Decreased Interest 0 0 0 0 0  Down, Depressed, Hopeless 0 0 0 0 0  PHQ - 2 Score 0 0 0 0 0  Altered sleeping 0 0 0 0 0  Tired, decreased energy 0 0 0 0 0  Change in appetite 0 0 0 0 0  Feeling bad or failure about yourself  0 0 0 0 0  Trouble concentrating 0 0 0 0 0  Moving slowly or fidgety/restless 0 0 0 0 0  Suicidal thoughts 0 0 0 0 0  PHQ-9 Score 0 0 0 0 0  Difficult doing work/chores Not difficult at all Not difficult at all - Not difficult at all -   PHQ-2/9 Result is negative.    Fall Risk: Fall Risk  08/23/2019 05/03/2019 02/18/2019 05/20/2018 02/10/2018  Falls in the past year? 0 0 0 No No  Number falls in past yr: 0 0 0 - -  Injury with Fall? 0 0 0 - -  Follow up Falls evaluation completed - - - -     Assessment & Plan  1. Benign hypertension  - quinapril-hydrochlorothiazide (ACCURETIC) 20-12.5 MG tablet; Take 1 tablet by mouth daily.  Dispense: 90 tablet; Refill: 1  2. Dyslipidemia  - pravastatin (PRAVACHOL) 40 MG tablet; Take 1 tablet (40 mg total) by mouth daily.  Dispense: 90 tablet; Refill: 1  3. History of prostate cancer  Doing well, last PSA below 0.1   4. Metabolic syndrome  He is trying to cut down on sugar  5. Obesity (BMI 30-39.9)  Discussed with the patient the risk posed by an increased BMI. Discussed importance of portion control, calorie counting and at least 150 minutes of physical activity weekly. Avoid sweet beverages and drink more water. Eat at least 6 servings of fruit and vegetables daily   I discussed the assessment and treatment plan with the patient. The patient was provided an opportunity to ask questions and all were answered. The patient agreed with the  plan and demonstrated an understanding of the instructions.  The patient was advised to call back or seek an in-person evaluation if the symptoms worsen or if the condition fails to improve as anticipated.  I provided 25  minutes of non-face-to-face time during this encounter.

## 2019-11-28 ENCOUNTER — Other Ambulatory Visit: Payer: 59

## 2019-11-28 ENCOUNTER — Other Ambulatory Visit: Payer: Self-pay

## 2019-11-28 DIAGNOSIS — C61 Malignant neoplasm of prostate: Secondary | ICD-10-CM

## 2019-11-29 LAB — PSA: Prostate Specific Ag, Serum: <0.1 ng/mL (ref 0.0–4.0)

## 2019-11-29 NOTE — Progress Notes (Signed)
11/30/19 2:11 PM   [name] [birth year] [id number]  Referring provider: Steele Sizer, MD [address] [place],  [place] [address] Chief Complaint  Patient presents with  . Prostate Cancer    HPI: Jeffrey Whitaker is a 59 y.o. M with personal history of prostate cancer status post radical prostatectomy returns today for routine follow-up of prostate cancer.  S/p uncomplicated partial nerve sparing procedure on 04/26/2018. Surgical pathology showed Gleason 3+4 prostate cancer without evidence of extracapsular extension. Margins negative. No seminal vesicle and bladder involvement. Lymph nodes negative. pT2 N0.  Most recent PSA undetectable as of 11/28/19.   No urinary complaints today.   He is doing fine overall and reports of being physically active. He is able to achieve an erection w/ super Trimix 0.7 and is satisfied. He has not tried Sildenafil since he does not a Rx at the moment. He is interested in trying Sildenafil again.  He does report that he started to have episodes where he will feel some penile fullness spontaneously without any medications.  No full spontaneous erections.  PMH: Past Medical History:  Diagnosis Date  . Elevated PSA   . Hypertension   . Prostate cancer ([place]) 2019  . Umbilical hernia [id number]    Surgical History: Past Surgical History:  Procedure Laterality Date  . COLONOSCOPY    . KNEE SURGERY Right    at age 58; clean out  . PELVIC LYMPH NODE DISSECTION Bilateral 04/26/2018   Procedure: PELVIC LYMPH NODE DISSECTION;  Surgeon: [name], MD;  Location: [facility] ORS;  Service: Urology;  Laterality: Bilateral;  . ROBOT ASSISTED LAPAROSCOPIC RADICAL PROSTATECTOMY N/A 04/26/2018   Procedure: ROBOTIC ASSISTED LAPAROSCOPIC RADICAL PROSTATECTOMY;  Surgeon: [name], MD;  Location: [facility] OR[facility];  Service: Urology;  Laterality: N/A;    Home Medications:  Allergies as of 11/30/2019   No Known Allergies     Medication List       Accurate as of Nov 30, 2019  2:11 PM. If you have any questions, ask your nurse or doctor.        aspirin EC 81 MG tablet Take 81 mg by mouth daily.   MENS 50+ MULTI VITAMIN/MIN PO Take 1 tablet by mouth daily.   NONFORMULARY OR COMPOUNDED ITEM Trimix (30/2/20)-(Pap/Phent/PGE)  Dosage: Inject 0.7 cc per injection  Vial 2ml  Qty #10 Refills 6  [place] [phone] Fax [phone]   pravastatin 40 MG tablet Commonly known as: PRAVACHOL Take 1 tablet (40 mg total) by mouth daily.   quinapril-hydrochlorothiazide 20-12.5 MG tablet Commonly known as: ACCURETIC Take 1 tablet by mouth daily.   sildenafil 20 MG tablet Commonly known as: Revatio Take 1 tablet (20 mg total) by mouth as needed. Take 1-5 tabs as needed prior to intercourse Started by: [name], MD       Allergies: No Known Allergies  Family History: Family History  Problem Relation Age of Onset  . Hypertension Mother   . CAD Mother   . Lung cancer Mother   . Benign prostatic hyperplasia Maternal Uncle   . Prostate cancer Maternal Uncle   . Hypertension Sister   . Dementia Maternal Grandfather   . Cancer Paternal Grandmother        Think stomach or intestinal  . Kidney disease Neg Hx   . Bladder Cancer Neg Hx   . Kidney cancer Neg Hx     Social History:  reports that he quit smoking about 37 years ago. His smoking  use included cigarettes. He started smoking about 39 years ago. He quit after 2.00 years of use. He quit smokeless tobacco use about 32 years ago.  His smokeless tobacco use included chew. He reports current alcohol use. He reports that he does not use drugs.   Physical Exam: BP 127/84   Pulse 73   Ht 5\' 10"  (1.778 m)   Wt 265 lb (120.2 kg)   BMI 38.02 kg/m   Constitutional:  Alert and oriented, No acute distress. HEENT: [place] AT, moist mucus membranes.  Trachea midline, no masses. Cardiovascular: No clubbing, cyanosis, or edema. Respiratory: Normal respiratory  effort, no increased work of breathing. Skin: No rashes, bruises or suspicious lesions. Neurologic: Grossly intact, no focal deficits, moving all 4 extremities. Psychiatric: Normal mood and affect.  Assessment & Plan:    1. Prostate cancer NED Recommend every 6 month PSAs for several years and then annually  2. Erectile dysfunction after radical prostatectomy Effective w/ super Trimix 0.7  We did discuss priapism precautions in detail especially when dose titrating He understands and is agreeable this plan Interested in trying sildenafil again  Rx of Sildenafil sent to pharmacy   3. Stress incontinence of urine Resolved  Continue to encourage pelvic floor exercises to maintain  Return for 6 MONTH PSA AND 1 YEAR PSA OFFICE VISIT.  [place] [address], [place], [place] [address] [phone]  I, Lucas Mallow, am acting as a scribe for Dr. [name],  I have reviewed the above documentation for accuracy and completeness, and I agree with the above.   [name], MD

## 2019-11-30 ENCOUNTER — Encounter: Payer: Self-pay | Admitting: Urology

## 2019-11-30 ENCOUNTER — Other Ambulatory Visit: Payer: Self-pay

## 2019-11-30 ENCOUNTER — Ambulatory Visit (INDEPENDENT_AMBULATORY_CARE_PROVIDER_SITE_OTHER): Payer: 59 | Admitting: Urology

## 2019-11-30 VITALS — BP 127/84 | HR 73 | Ht 70.0 in | Wt 265.0 lb

## 2019-11-30 DIAGNOSIS — C61 Malignant neoplasm of prostate: Secondary | ICD-10-CM

## 2019-11-30 MED ORDER — SILDENAFIL CITRATE 20 MG PO TABS
20.0000 mg | ORAL_TABLET | ORAL | 11 refills | Status: DC | PRN
Start: 2019-11-30 — End: 2020-11-29

## 2020-01-28 ENCOUNTER — Other Ambulatory Visit: Payer: Self-pay | Admitting: Family Medicine

## 2020-01-28 DIAGNOSIS — E785 Hyperlipidemia, unspecified: Secondary | ICD-10-CM

## 2020-01-28 NOTE — Telephone Encounter (Signed)
Requested Prescriptions  Pending Prescriptions Disp Refills  . pravastatin (PRAVACHOL) 40 MG tablet [Pharmacy Med Name: PRAVASTATIN SOD 40MG  TABLET] 90 tablet 0    Sig: TAKE 1 TABLET BY MOUTH  DAILY     Cardiovascular:  Antilipid - Statins Passed - 01/28/2020  9:19 PM      Passed - Total Cholesterol in normal range and within 360 days    Cholesterol, Total  Date Value Ref Range Status  03/09/2019 140 100 - 199 mg/dL Final         Passed - LDL in normal range and within 360 days    LDL Calculated  Date Value Ref Range Status  03/09/2019 76 0 - 99 mg/dL Final         Passed - HDL in normal range and within 360 days    HDL  Date Value Ref Range Status  03/09/2019 49 >39 mg/dL Final         Passed - Triglycerides in normal range and within 360 days    Triglycerides  Date Value Ref Range Status  03/09/2019 73 0 - 149 mg/dL Final         Passed - Patient is not pregnant      Passed - Valid encounter within last 12 months    Recent Outpatient Visits          5 months ago Benign hypertension   [facility] Steele Sizer, MD   9 months ago Well adult exam   [facility] Steele Sizer, MD   11 months ago Metabolic syndrome   [facility] Steele Sizer, MD   1 year ago Benign hypertension   [facility] Steele Sizer, MD   1 year ago Encounter for routine history and physical exam for male   [facility] Steele Sizer, MD      Future Appointments            In 1 month Ancil Boozer, Drue Stager, MD [facility], [place]   In 10 months [name], [facility]

## 2020-02-21 ENCOUNTER — Ambulatory Visit: Payer: 59 | Admitting: Family Medicine

## 2020-03-13 NOTE — Progress Notes (Signed)
Name: Jeffrey Whitaker   MRN: [id number]    DOB: 05-01-1961   Date:03/14/2020       Progress Note  Subjective  Chief Complaint  Chief Complaint  Patient presents with  . Follow-up  . Hypertension    HPI  Metabolic Syndrome: on diet and exercise, denies polyphagia, polyuria or polydipsia, last hgbA1C6.2%. His weight has gone up since last seen in our office October 2020, gained 7 lbs.  He is still working with a trainer twice a week and adding a hike on the weekends, however he changed jobs and has not been very consistent as usual with physical activity but he will try to add activity at work fitness center   HTN: he is taking medication as prescribed, denies chest pain or palpitation.BP at home is usually  120/70's .   Obesity: his weight was down to 245 lbs in the beginning of 2020, but he gained weight after prostate surgery and also COVID-19 life style changes. Weight today 272 lbs  History of prostate cancer: s/p surgery done 03/2018, under the care of Dr. Erlene Quan, he is doing well,he finishedpelvic floor PT but is still doing the exercises at home.  Last PSA was normal, staying below 0.1 , he is up to date with Urologist follow up  Dyslipidemia: he is taking pravastatin, no myalgias, last LDL 76, and HDL 49, triglycerides 73 . We will recheck labs during his CPE in October    Patient Active Problem List   Diagnosis Date Noted  . History of prostate cancer 08/23/2019  . Allergic rhinitis, seasonal 04/15/2015  . Benign hypertension 04/11/2015  . Dyslipidemia 04/11/2015  . Obesity (BMI 30.0-34.9) 04/11/2015  . Right medial knee pain 04/11/2015  . Metabolic syndrome 43/15/4008  . Left shoulder pain 04/11/2015  . Umbilical hernia 67/61/9509  . Microcytosis 04/11/2015    Past Surgical History:  Procedure Laterality Date  . COLONOSCOPY    . KNEE SURGERY Right    at age 66; clean out  . PELVIC LYMPH NODE DISSECTION Bilateral 04/26/2018   Procedure: PELVIC LYMPH NODE  DISSECTION;  Surgeon: [name], MD;  Location: [facility] OR[facility];  Service: Urology;  Laterality: Bilateral;  . ROBOT ASSISTED LAPAROSCOPIC RADICAL PROSTATECTOMY N/A 04/26/2018   Procedure: ROBOTIC ASSISTED LAPAROSCOPIC RADICAL PROSTATECTOMY;  Surgeon: [name], MD;  Location: [facility] OR[facility];  Service: Urology;  Laterality: N/A;    Family History  Problem Relation Age of Onset  . Hypertension Mother   . CAD Mother   . Lung cancer Mother   . Benign prostatic hyperplasia Maternal Uncle   . Prostate cancer Maternal Uncle   . Hypertension Sister   . Dementia Maternal Grandfather   . Cancer Paternal Grandmother        Think stomach or intestinal  . Kidney disease Neg Hx   . Bladder Cancer Neg Hx   . Kidney cancer Neg Hx     Social History   Tobacco Use  . Smoking status: Former Smoker    Years: 2.00    Types: Cigarettes    Start date: 07/28/1980    Quit date: 07/28/1982    Years since quitting: 37.6  . Smokeless tobacco: Former Systems developer    Types: Chew    Quit date: 07/29/1987  Substance Use Topics  . Alcohol use: Yes    Alcohol/week: 0.0 standard drinks    Comment: rarely     Current Outpatient Medications:  .  aspirin EC 81 MG tablet, Take 81 mg by mouth daily., Disp: ,  Rfl:  .  Multiple Vitamins-Minerals (MENS 50+ MULTI VITAMIN/MIN PO), Take 1 tablet by mouth daily., Disp: , Rfl:  .  NONFORMULARY OR COMPOUNDED ITEM, Trimix (30/2/20)-(Pap/Phent/PGE)  Dosage: Inject 0.7 cc per injection  Vial 5ml  Qty #10 Refills 6  [place] [phone] Fax [phone], Disp: 10 each, Rfl: 6 .  pravastatin (PRAVACHOL) 40 MG tablet, TAKE 1 TABLET BY MOUTH  DAILY, Disp: 90 tablet, Rfl: 0 .  quinapril-hydrochlorothiazide (ACCURETIC) 20-12.5 MG tablet, Take 1 tablet by mouth daily., Disp: 90 tablet, Rfl: 1 .  sildenafil (REVATIO) 20 MG tablet, Take 1 tablet (20 mg total) by mouth as needed. Take 1-5 tabs as needed prior to intercourse, Disp: 30 tablet, Rfl: 11  No Known Allergies  I  personally reviewed active problem list, medication list, allergies, family history, social history, health maintenance with the patient/caregiver today.   ROS  Constitutional: Negative for fever positive for  weight change.  Respiratory: Negative for cough and shortness of breath.   Cardiovascular: Negative for chest pain or palpitations.  Gastrointestinal: Negative for abdominal pain, no bowel changes.  Musculoskeletal: Negative for gait problem or joint swelling.  Skin: Negative for rash.  Neurological: Negative for dizziness or headache.  No other specific complaints in a complete review of systems (except as listed in HPI above).  Objective  Vitals:   03/14/20 0829  BP: 130/82  Pulse: 98  Resp: 18  Temp: 98.8 F (37.1 C)  TempSrc: Oral  SpO2: 99%  Weight: 272 lb 3.2 oz (123.5 kg)  Height: 5\' 10"  (1.778 m)    Body mass index is 39.06 kg/m.  Physical Exam  Constitutional: Patient appears well-developed and well-nourished. Obese  No distress.  HEENT: head atraumatic, normocephalic, pupils equal and reactive to light, e neck supple, Cardiovascular: Normal rate, regular rhythm and normal heart sounds.  No murmur heard. No BLE edema. Pulmonary/Chest: Effort normal and breath sounds normal. No respiratory distress. Abdominal: Soft.  There is no tenderness. Psychiatric: Patient has a normal mood and affect. behavior is normal. Judgment and thought content normal.  PHQ2/9: Depression screen [facility] 2/9 03/14/2020 08/23/2019 05/03/2019 02/18/2019 05/20/2018  Decreased Interest 0 0 0 0 0  Down, Depressed, Hopeless 0 0 0 0 0  PHQ - 2 Score 0 0 0 0 0  Altered sleeping 0 0 0 0 0  Tired, decreased energy 0 0 0 0 0  Change in appetite 0 0 0 0 0  Feeling bad or failure about yourself  0 0 0 0 0  Trouble concentrating 0 0 0 0 0  Moving slowly or fidgety/restless 0 0 0 0 0  Suicidal thoughts 0 0 0 0 0  PHQ-9 Score 0 0 0 0 0  Difficult doing work/chores - Not difficult at all Not  difficult at all - Not difficult at all    phq 9 is negative   Fall Risk: Fall Risk  03/14/2020 08/23/2019 05/03/2019 02/18/2019 05/20/2018  Falls in the past year? 0 0 0 0 No  Number falls in past yr: 0 0 0 0 -  Injury with Fall? 0 0 0 0 -  Follow up - Falls evaluation completed - - -     Functional Status Survey: Is the patient deaf or have difficulty hearing?: No Does the patient have difficulty seeing, even when wearing glasses/contacts?: No Does the patient have difficulty concentrating, remembering, or making decisions?: No Does the patient have difficulty walking or climbing stairs?: No Does the patient have difficulty dressing or  bathing?: No Does the patient have difficulty doing errands alone such as visiting a doctor's office or shopping?: No    Assessment & Plan  1. Benign hypertension  Continue current medications   2. Metabolic syndrome  Discussed life style modification   3. Dyslipidemia  Recheck labs next visit   4. History of prostate cancer  Keep follow up with urologist   5. Obesity (BMI 30-39.9)  Discussed with the patient the risk posed by an increased BMI. Discussed importance of portion control, calorie counting and at least 150 minutes of physical activity weekly. Avoid sweet beverages and drink more water. Eat at least 6 servings of fruit and vegetables daily   6. Need for shingles vaccine  - Varicella-zoster vaccine IM

## 2020-03-14 ENCOUNTER — Ambulatory Visit (INDEPENDENT_AMBULATORY_CARE_PROVIDER_SITE_OTHER): Payer: BC Managed Care – PPO | Admitting: Family Medicine

## 2020-03-14 ENCOUNTER — Other Ambulatory Visit: Payer: Self-pay

## 2020-03-14 ENCOUNTER — Encounter: Payer: Self-pay | Admitting: Family Medicine

## 2020-03-14 VITALS — BP 130/82 | HR 98 | Temp 98.8°F | Resp 18 | Ht 70.0 in | Wt 272.2 lb

## 2020-03-14 DIAGNOSIS — E8881 Metabolic syndrome: Secondary | ICD-10-CM | POA: Diagnosis not present

## 2020-03-14 DIAGNOSIS — E785 Hyperlipidemia, unspecified: Secondary | ICD-10-CM

## 2020-03-14 DIAGNOSIS — Z8546 Personal history of malignant neoplasm of prostate: Secondary | ICD-10-CM | POA: Diagnosis not present

## 2020-03-14 DIAGNOSIS — I10 Essential (primary) hypertension: Secondary | ICD-10-CM | POA: Diagnosis not present

## 2020-03-14 DIAGNOSIS — E669 Obesity, unspecified: Secondary | ICD-10-CM

## 2020-03-14 DIAGNOSIS — Z23 Encounter for immunization: Secondary | ICD-10-CM

## 2020-04-04 ENCOUNTER — Encounter: Payer: Self-pay | Admitting: Family Medicine

## 2020-04-05 ENCOUNTER — Other Ambulatory Visit: Payer: Self-pay

## 2020-04-05 DIAGNOSIS — I10 Essential (primary) hypertension: Secondary | ICD-10-CM

## 2020-04-05 DIAGNOSIS — E785 Hyperlipidemia, unspecified: Secondary | ICD-10-CM

## 2020-04-06 MED ORDER — QUINAPRIL-HYDROCHLOROTHIAZIDE 20-12.5 MG PO TABS: 1.0000 | ORAL_TABLET | Freq: Every day | ORAL | 1 refills | Status: DC

## 2020-04-06 MED ORDER — PRAVASTATIN SODIUM 40 MG PO TABS: 40.0000 mg | ORAL_TABLET | Freq: Every day | ORAL | 1 refills | Status: DC

## 2020-06-01 ENCOUNTER — Other Ambulatory Visit: Payer: Self-pay

## 2020-06-19 ENCOUNTER — Other Ambulatory Visit: Payer: Self-pay

## 2020-06-19 ENCOUNTER — Other Ambulatory Visit: Payer: BC Managed Care – PPO

## 2020-06-19 DIAGNOSIS — C61 Malignant neoplasm of prostate: Secondary | ICD-10-CM | POA: Diagnosis not present

## 2020-06-19 DIAGNOSIS — R972 Elevated prostate specific antigen [PSA]: Secondary | ICD-10-CM | POA: Diagnosis not present

## 2020-06-20 LAB — PSA: Prostate Specific Ag, Serum: <0.1 ng/mL (ref 0.0–4.0)

## 2020-06-26 ENCOUNTER — Telehealth: Payer: Self-pay | Admitting: *Deleted

## 2020-06-26 ENCOUNTER — Encounter: Payer: Self-pay | Admitting: *Deleted

## 2020-06-26 NOTE — Telephone Encounter (Signed)
Sent message to his my chart

## 2020-06-26 NOTE — Telephone Encounter (Signed)
-----   Message from [name], MD sent at 06/20/2020 12:36 PM EST ----- PSA is undetectable.  Great news.  [name], MD

## 2020-09-07 NOTE — Progress Notes (Signed)
Name: ZYQUAN CROTTY   MRN: [id number]    DOB: [birth year]   Date:09/10/2020       Progress Note  Subjective  Chief Complaint  Annual Exam  HPI  Patient presents for annual CPE and follow up  Discussed possible extra cost due to split visit  Metabolic Syndrome: on diet and exercise, denies polyphagia, polyuria or polydipsia, last hgbA1C6.2%. His weight has gone up again by 7 lbs. He is seeing a trained three days a week for 45 minutes session, he has been walking on treadmill for 30 minutes at work twice a week.   HTN: he is taking medication as prescribed, denies chest pain or palpitation.BP at home has been a little higher in the 130's, also here is 138/86, we will change from quinapril hctz , avalide 150/12.5   Obesity:his weight was down to 245 lbs in the beginning of 2020, but he gained weight after prostate surgery and also COVID-19 life style changes. Weight today is up to 279 lbs - BMI is now above 40, discussed importance of cutting  on calories intake   History of prostate cancer: s/p surgery done 03/2018, under the care of Dr. Erlene Quan, he is doing well,he finishedpelvic floor PTbut is still doing the exercises at home.Last PSA was normal, staying below 0.1. He states Revatio not working well for him, he will discuss it with Urologist   Dyslipidemia: he is taking pravastatin, no myalgias, last LDL 76, and HDL 49, triglycerides 73 . We will recheck level    IPSS Questionnaire (AUA-7): Over the past month.   1)  How often have you had a sensation of not emptying your bladder completely after you finish urinating?    2)  How often have you had to urinate again less than two hours after you finished urinating? 0 - Not at all  3)  How often have you found you stopped and started again several times when you urinated?  0 - Not at all  4) How difficult have you found it to postpone urination?  0 - Not at all  5) How often have you had a weak urinary stream?  1 - Less  than 1 time in 5  6) How often have you had to push or strain to begin urination?  0 - Not at all  7) How many times did you most typically get up to urinate from the time you went to bed until the time you got up in the morning?  1 - 1 time  Total score:  0-7 mildly symptomatic   8-19 moderately symptomatic   20-35 severely symptomatic     Diet: discussed a balanced diet  Exercise: continue at least 150 minutes per week   Depression: phq 9 is negative Depression screen [facility] 2/9 09/10/2020 03/14/2020 08/23/2019 05/03/2019 02/18/2019  Decreased Interest 0 0 0 0 0  Down, Depressed, Hopeless 0 0 0 0 0  PHQ - 2 Score 0 0 0 0 0  Altered sleeping 0 0 0 0 0  Tired, decreased energy 0 0 0 0 0  Change in appetite 0 0 0 0 0  Feeling bad or failure about yourself  0 0 0 0 0  Trouble concentrating 0 0 0 0 0  Moving slowly or fidgety/restless 0 0 0 0 0  Suicidal thoughts 0 0 0 0 0  PHQ-9 Score 0 0 0 0 0  Difficult doing work/chores - - Not difficult at all Not difficult at all -  Hypertension:  BP Readings from Last 3 Encounters:  09/10/20 138/86  03/14/20 130/82  11/30/19 127/84    Obesity: Wt Readings from Last 3 Encounters:  09/10/20 279 lb 4.8 oz (126.7 kg)  03/14/20 272 lb 3.2 oz (123.5 kg)  11/30/19 265 lb (120.2 kg)   BMI Readings from Last 3 Encounters:  09/10/20 40.08 kg/m  03/14/20 39.06 kg/m  11/30/19 38.02 kg/m     Lipids:  Lab Results  Component Value Date   CHOL 140 03/09/2019   CHOL 150 02/18/2018   CHOL 135 10/18/2016   Lab Results  Component Value Date   HDL 49 03/09/2019   HDL 53 02/18/2018   HDL 51 10/17/2016   Lab Results  Component Value Date   LDLCALC 76 03/09/2019   LDLCALC 82 02/18/2018   LDLCALC 74 10/17/2016   Lab Results  Component Value Date   TRIG 73 03/09/2019   TRIG 77 02/18/2018   TRIG 49 10/17/2016   Lab Results  Component Value Date   CHOLHDL 2.9 03/09/2019   CHOLHDL 2.8 02/18/2018   CHOLHDL 2.6 10/17/2016   No results  found for: LDLDIRECT Glucose:  Glucose  Date Value Ref Range Status  03/09/2019 96 65 - 99 mg/dL Final  08/27/2018 98 65 - 99 mg/dL Final  02/18/2018 95 65 - 99 mg/dL Final   Glucose, Bld  Date Value Ref Range Status  04/27/2018 130 (H) 70 - 99 mg/dL Final  04/19/2018 102 (H) 70 - 99 mg/dL Final    [place] Visit from 03/14/2020 in [facility]  AUDIT-C Score 0       Married STD testing and prevention (HIV/chl/gon/syphilis): not interested  Hep C: 10/05/12  Skin cancer: Discussed monitoring for atypical lesions Colorectal cancer: 08/28/12 Prostate cancer: s/p prostatectomy 03/2018    Lung cancer: Low Dose CT Chest recommended if Age 34-80 years, 30 pack-year currently smoking OR have quit w/in 15years. Patient does not qualify.   AAA:  The USPSTF recommends one-time screening with ultrasonography in men ages 60 to 45 years who have ever smoked, we will do it when he turns 10  ECG:  04/19/18  Vaccines:  HPV: up to at age 36 , ask insurance if age between 8-45  Shingrix: 9-64 yo and ask insurance if covered when patient above 5 yo Pneumonia: educated and discussed with patient. Flu: educated and discussed with patient.  Advanced Care Planning: A voluntary discussion about advance care planning including the explanation and discussion of advance directives.  Discussed health care proxy and Living will, and the patient was able to identify a health care proxy as wife .    Patient Active Problem List   Diagnosis Date Noted  . Morbid obesity with BMI of 40.0-44.9, adult ([place]) 09/10/2020  . History of prostate cancer 08/23/2019  . Allergic rhinitis, seasonal 04/15/2015  . Benign hypertension 04/11/2015  . Dyslipidemia 04/11/2015  . Obesity (BMI 30.0-34.9) 04/11/2015  . Right medial knee pain 04/11/2015  . Metabolic syndrome 48/18/5631  . Left shoulder pain 04/11/2015  . Umbilical hernia 49/70/2637  . Microcytosis 04/11/2015    Past  Surgical History:  Procedure Laterality Date  . COLONOSCOPY    . KNEE SURGERY Right    at age 46; clean out  . PELVIC LYMPH NODE DISSECTION Bilateral 04/26/2018   Procedure: PELVIC LYMPH NODE DISSECTION;  Surgeon: [name], MD;  Location: [facility] ORS;  Service: Urology;  Laterality: Bilateral;  . ROBOT ASSISTED LAPAROSCOPIC RADICAL PROSTATECTOMY N/A 04/26/2018  Procedure: ROBOTIC ASSISTED LAPAROSCOPIC RADICAL PROSTATECTOMY;  Surgeon: [name], MD;  Location: [facility] ORS;  Service: Urology;  Laterality: N/A;    Family History  Problem Relation Age of Onset  . Hypertension Mother   . CAD Mother   . Lung cancer Mother   . Benign prostatic hyperplasia Maternal Uncle   . Prostate cancer Maternal Uncle   . Hypertension Sister   . Dementia Maternal Grandfather   . Cancer Paternal Grandmother        Think stomach or intestinal  . Kidney disease Neg Hx   . Bladder Cancer Neg Hx   . Kidney cancer Neg Hx     Social History   Socioeconomic History  . Marital status: Married    Spouse name: Santiago Glad  . Number of children: 2  . Years of education: Not on file  . Highest education level: Associate degree: occupational, Hotel manager, or vocational program  Occupational History  . Not on file  Tobacco Use  . Smoking status: Former Smoker    Years: 2.00    Types: Cigarettes    Start date: 07/28/1980    Quit date: 07/28/1982    Years since quitting: 38.1  . Smokeless tobacco: Former Systems developer    Types: [place] date: 07/29/1987  Vaping Use  . Vaping Use: Never used  Substance and Sexual Activity  . Alcohol use: Yes    Alcohol/week: 0.0 standard drinks    Comment: rarely  . Drug use: No  . Sexual activity: Yes    Partners: Female  Other Topics Concern  . Not on file  Social History Narrative   Patient has 2 step-children   Social Determinants of Health   Financial Resource Strain: Low Risk   . Difficulty of Paying Living Expenses: Not hard at all  Food Insecurity: No Food  Insecurity  . Worried About Charity fundraiser in the Last Year: Never true  . Ran Out of Food in the Last Year: Never true  Transportation Needs: No Transportation Needs  . Lack of Transportation (Medical): No  . Lack of Transportation (Non-Medical): No  Physical Activity: Sufficiently Active  . Days of Exercise per Week: 5 days  . Minutes of Exercise per Session: 40 min  Stress: No Stress Concern Present  . Feeling of Stress : Not at all  Social Connections: Socially Integrated  . Frequency of Communication with Friends and Family: More than three times a week  . Frequency of Social Gatherings with Friends and Family: Once a week  . Attends Religious Services: More than 4 times per year  . Active Member of Clubs or Organizations: Yes  . Attends Archivist Meetings: More than 4 times per year  . Marital Status: Married  Human resources officer Violence: Not At Risk  . Fear of Current or Ex-Partner: No  . Emotionally Abused: No  . Physically Abused: No  . Sexually Abused: No     Current Outpatient Medications:  .  irbesartan-hydrochlorothiazide (AVALIDE) 150-12.5 MG tablet, Take 1 tablet by mouth daily. In place of quinapril hctz, Disp: 90 tablet, Rfl: 1 .  Multiple Vitamins-Minerals (MENS 50+ MULTI VITAMIN/MIN PO), Take 1 tablet by mouth daily., Disp: , Rfl:  .  NONFORMULARY OR COMPOUNDED ITEM, Trimix (30/2/20)-(Pap/Phent/PGE)  Dosage: Inject 0.7 cc per injection  Vial 78ml  Qty #10 Refills 6  [place] [phone] Fax [phone], Disp: 10 each, Rfl: 6 .  sildenafil (REVATIO) 20 MG tablet, Take 1 tablet (20 mg total)  by mouth as needed. Take 1-5 tabs as needed prior to intercourse, Disp: 30 tablet, Rfl: 11 .  pravastatin (PRAVACHOL) 40 MG tablet, Take 1 tablet (40 mg total) by mouth daily., Disp: 90 tablet, Rfl: 1  No Known Allergies   ROS  Constitutional: Negative for fever, positive for  weight change.  Respiratory: Negative for cough and shortness of  breath.   Cardiovascular: Negative for chest pain or palpitations.  Gastrointestinal: Negative for abdominal pain, no bowel changes.  Musculoskeletal: Negative for gait problem or joint swelling.  Skin: Negative for rash.  Neurological: Negative for dizziness or headache.  No other specific complaints in a complete review of systems (except as listed in HPI above).   Objective  Vitals:   09/10/20 0810  BP: 138/86  Pulse: 87  Resp: 16  Temp: 99 F (37.2 C)  TempSrc: Oral  SpO2: 97%  Weight: 279 lb 4.8 oz (126.7 kg)  Height: 5\' 10"  (1.778 m)    Body mass index is 40.08 kg/m.  Physical Exam  Constitutional: Patient appears well-developed and well-nourished. No distress.  HENT: Head: Normocephalic and atraumatic. Ears: B TMs ok, no erythema or effusion; Nose: Not done Mouth/Throat: not done  Eyes: Conjunctivae and EOM are normal. Pupils are equal, round, and reactive to light. No scleral icterus.  Neck: Normal range of motion. Neck supple. No JVD present. No thyromegaly present.  Cardiovascular: Normal rate, regular rhythm and normal heart sounds.  No murmur heard. No BLE edema. Pulmonary/Chest: Effort normal and breath sounds normal. No respiratory distress. Abdominal: Soft. Bowel sounds are normal, no distension. There is no tenderness. no masses MALE GENITALIA: Normal descended testes bilaterally, no masses palpated, no hernias, no lesions, no discharge RECTAL: not done  Musculoskeletal: Normal range of motion, no joint effusions. No gross deformities Neurological: he is alert and oriented to person, place, and time. No cranial nerve deficit. Coordination, balance, strength, speech and gait are normal.  Skin: Skin is warm and dry. No rash noted. No erythema.  Psychiatric: Patient has a normal mood and affect. behavior is normal. Judgment and thought content normal.  Recent Results (from the past 2160 hour(s))  PSA     Status: None   Collection Time: 06/19/20 10:03 AM   Result Value Ref Range   Prostate Specific Ag, Serum <0.1 0.0 - 4.0 ng/mL    Comment: Roche ECLIA methodology. According to the American Urological Association, Serum PSA should decrease and remain at undetectable levels after radical prostatectomy. The AUA defines biochemical recurrence as an initial PSA value 0.2 ng/mL or greater followed by a subsequent confirmatory PSA value 0.2 ng/mL or greater. Values obtained with different assay methods or kits cannot be used interchangeably. Results cannot be interpreted as absolute evidence of the presence or absence of malignant disease.      Fall Risk: Fall Risk  09/10/2020 03/14/2020 08/23/2019 05/03/2019 02/18/2019  Falls in the past year? 0 0 0 0 0  Number falls in past yr: 0 0 0 0 0  Injury with Fall? 0 0 0 0 0  Follow up - - Falls evaluation completed - -     Functional Status Survey: Is the patient deaf or have difficulty hearing?: No Does the patient have difficulty seeing, even when wearing glasses/contacts?: No Does the patient have difficulty concentrating, remembering, or making decisions?: No Does the patient have difficulty walking or climbing stairs?: No Does the patient have difficulty dressing or bathing?: No Does the patient have difficulty doing errands alone such  as visiting a doctor's office or shopping?: No   Assessment & Plan  1. Well adult exam  - Lipid panel - CBC with Differential/Platelet - Hemoglobin A1c - COMPLETE METABOLIC PANEL WITH GFR  2. Benign hypertension  - CBC with Differential/Platelet - irbesartan-hydrochlorothiazide (AVALIDE) 150-12.5 MG tablet; Take 1 tablet by mouth daily. In place of quinapril hctz  Dispense: 90 tablet; Refill: 1  3. Metabolic syndrome  - Hemoglobin A1c  4. Dyslipidemia  - Lipid panel - pravastatin (PRAVACHOL) 40 MG tablet; Take 1 tablet (40 mg total) by mouth daily.  Dispense: 90 tablet; Refill: 1  5. History of prostate cancer   6. Need for shingles  vaccine  - Varicella-zoster vaccine IM  7. ED (erectile dysfunction) of organic origin   8. Morbid obesity with BMI of 40.0-44.9, adult [facility])  Discussed with the patient the risk posed by an increased BMI. Discussed importance of portion control, calorie counting and at least 150 minutes of physical activity weekly. Avoid sweet beverages and drink more water. Eat at least 6 servings of fruit and vegetables daily   -Prostate cancer screening and PSA options (with potential risks and benefits of testing vs not testing) were discussed along with recent recs/guidelines. -USPSTF grade A and B recommendations reviewed with patient; age-appropriate recommendations, preventive care, screening tests, etc discussed and encouraged; healthy living encouraged; see AVS for patient education given to patient -Discussed importance of 150 minutes of physical activity weekly, eat two servings of fish weekly, eat one serving of tree nuts ( cashews, pistachios, pecans, almonds.Marland Kitchen) every other day, eat 6 servings of fruit/vegetables daily and drink plenty of water and avoid sweet beverages.

## 2020-09-10 ENCOUNTER — Encounter: Payer: Self-pay | Admitting: Family Medicine

## 2020-09-10 ENCOUNTER — Other Ambulatory Visit: Payer: Self-pay

## 2020-09-10 ENCOUNTER — Ambulatory Visit (INDEPENDENT_AMBULATORY_CARE_PROVIDER_SITE_OTHER): Payer: BC Managed Care – PPO | Admitting: Family Medicine

## 2020-09-10 VITALS — BP 138/86 | HR 87 | Temp 99.0°F | Resp 16 | Ht 70.0 in | Wt 279.3 lb

## 2020-09-10 DIAGNOSIS — E785 Hyperlipidemia, unspecified: Secondary | ICD-10-CM | POA: Diagnosis not present

## 2020-09-10 DIAGNOSIS — E8881 Metabolic syndrome: Secondary | ICD-10-CM | POA: Diagnosis not present

## 2020-09-10 DIAGNOSIS — Z23 Encounter for immunization: Secondary | ICD-10-CM | POA: Diagnosis not present

## 2020-09-10 DIAGNOSIS — Z8546 Personal history of malignant neoplasm of prostate: Secondary | ICD-10-CM

## 2020-09-10 DIAGNOSIS — Z0000 Encounter for general adult medical examination without abnormal findings: Secondary | ICD-10-CM

## 2020-09-10 DIAGNOSIS — I10 Essential (primary) hypertension: Secondary | ICD-10-CM

## 2020-09-10 DIAGNOSIS — N529 Male erectile dysfunction, unspecified: Secondary | ICD-10-CM

## 2020-09-10 DIAGNOSIS — Z6841 Body Mass Index (BMI) 40.0 and over, adult: Secondary | ICD-10-CM

## 2020-09-10 MED ORDER — IRBESARTAN-HYDROCHLOROTHIAZIDE 150-12.5 MG PO TABS: 1.0000 | ORAL_TABLET | Freq: Every day | ORAL | 1 refills | Status: DC

## 2020-09-10 MED ORDER — PRAVASTATIN SODIUM 40 MG PO TABS: 40.0000 mg | ORAL_TABLET | Freq: Every day | ORAL | 1 refills | Status: DC

## 2020-09-10 NOTE — Patient Instructions (Signed)

## 2020-09-11 LAB — COMPLETE METABOLIC PANEL WITH GFR
AG Ratio: 1.4 (calc) (ref 1.0–2.5)
ALT: 28 U/L (ref 9–46)
AST: 18 U/L (ref 10–35)
Albumin: 4.4 g/dL (ref 3.6–5.1)
Alkaline phosphatase (APISO): 63 U/L (ref 35–144)
BUN: 15 mg/dL (ref 7–25)
CO2: 29 mmol/L (ref 20–32)
Calcium: 9.5 mg/dL (ref 8.6–10.3)
Chloride: 105 mmol/L (ref 98–110)
Creat: 1.23 mg/dL (ref 0.70–1.33)
GFR, Est African American: 74 mL/min/{1.73_m2} (ref >=60)
GFR, Est Non African American: 64 mL/min/{1.73_m2} (ref >=60)
Globulin: 3.1 g/dL (calc) (ref 1.9–3.7)
Glucose, Bld: 103 mg/dL — ABNORMAL HIGH (ref 65–99)
Potassium: 4.4 mmol/L (ref 3.5–5.3)
Sodium: 143 mmol/L (ref 135–146)
Total Bilirubin: 0.4 mg/dL (ref 0.2–1.2)
Total Protein: 7.5 g/dL (ref 6.1–8.1)

## 2020-09-11 LAB — CBC WITH DIFFERENTIAL/PLATELET
Absolute Monocytes: 578 cells/uL (ref 200–950)
Basophils Absolute: 51 cells/uL (ref 0–200)
Basophils Relative: 0.6 %
Eosinophils Absolute: 179 cells/uL (ref 15–500)
Eosinophils Relative: 2.1 %
HCT: 46.2 % (ref 38.5–50.0)
Hemoglobin: 14.5 g/dL (ref 13.2–17.1)
Lymphs Abs: 1598 cells/uL (ref 850–3900)
MCH: 23.5 pg — ABNORMAL LOW (ref 27.0–33.0)
MCHC: 31.4 g/dL — ABNORMAL LOW (ref 32.0–36.0)
MCV: 75 fL — ABNORMAL LOW (ref 80.0–100.0)
MPV: 10.8 fL (ref 7.5–12.5)
Monocytes Relative: 6.8 %
Neutro Abs: 6095 cells/uL (ref 1500–7800)
Neutrophils Relative %: 71.7 %
Platelets: 245 10*3/uL (ref 140–400)
RBC: 6.16 10*6/uL — ABNORMAL HIGH (ref 4.20–5.80)
RDW: 13.7 % (ref 11.0–15.0)
Total Lymphocyte: 18.8 %
WBC: 8.5 10*3/uL (ref 3.8–10.8)

## 2020-09-11 LAB — HEMOGLOBIN A1C
Hgb A1c MFr Bld: 6.2 % of total Hgb — ABNORMAL HIGH (ref <5.7)
Mean Plasma Glucose: 131 mg/dL
eAG (mmol/L): 7.3 mmol/L

## 2020-09-11 LAB — LIPID PANEL
Cholesterol: 177 mg/dL (ref <200)
HDL: 44 mg/dL (ref >=40)
LDL Cholesterol (Calc): 112 mg/dL (calc) — ABNORMAL HIGH
Non-HDL Cholesterol (Calc): 133 mg/dL (calc) — ABNORMAL HIGH (ref <130)
Total CHOL/HDL Ratio: 4 (calc) (ref <5.0)
Triglycerides: 105 mg/dL (ref <150)

## 2020-09-14 ENCOUNTER — Ambulatory Visit: Payer: BC Managed Care – PPO | Admitting: Family Medicine

## 2020-11-27 ENCOUNTER — Other Ambulatory Visit: Payer: Self-pay

## 2020-11-27 ENCOUNTER — Other Ambulatory Visit: Payer: BC Managed Care – PPO

## 2020-11-27 DIAGNOSIS — C61 Malignant neoplasm of prostate: Secondary | ICD-10-CM

## 2020-11-27 NOTE — Progress Notes (Signed)
11/29/2020  9:31 AM   [name] 08-Oct-1960 [id number]  Referring provider: Steele Sizer, MD [address] [place],  [place] [address] Chief Complaint  Patient presents with  . Prostate Cancer    HPI: Jeffrey Whitaker is a 60 y.o. male with a personal history of elevated PSA, and prostate cancer, who returns today for annual f/u.  S/puncomplicated partial nerve sparing procedure on 04/26/2018. Surgical pathology showed Gleason 3+4 prostate cancer without evidence of extracapsular extension. Margins negative. No seminal vesicle and bladder involvement. Lymph nodes negative. pT2 N0.  PSA remains undetectable.  He said that he is not leaking anymore. 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 But he has plans to increase his physical activity.  He is excited about backpacking again.  Super trimix worked ok, and he says that he has an erection that lasts for about 45 minutes. He is using the 0.8 dose. He will let MD know if he needs a refill or wants to change anything about his current treatment plan.    PMH: Past Medical History:  Diagnosis Date  . Elevated PSA   . Hypertension   . Prostate cancer ([place]) 2019  . Umbilical hernia [id number]    Surgical History: Past Surgical History:  Procedure Laterality Date  . COLONOSCOPY    . KNEE SURGERY Right    at age 82; clean out  . PELVIC LYMPH NODE DISSECTION Bilateral 04/26/2018   Procedure: PELVIC LYMPH NODE DISSECTION;  Surgeon: [name], MD;  Location: [facility] ORS;  Service: Urology;  Laterality: Bilateral;  . ROBOT ASSISTED LAPAROSCOPIC RADICAL PROSTATECTOMY N/A 04/26/2018   Procedure: ROBOTIC ASSISTED LAPAROSCOPIC RADICAL PROSTATECTOMY;  Surgeon: [name], MD;  Location: [facility] ORS;  Service: Urology;  Laterality: N/A;    Home Medications:  Allergies as of 11/29/2020   No Known Allergies     Medication List       Accurate as of  Nov 29, 2020  9:31 AM. If you have any questions, ask your nurse or doctor.        STOP taking these medications   sildenafil 20 MG tablet Commonly known as: Revatio Stopped by: [name], MD     TAKE these medications   irbesartan-hydrochlorothiazide 150-12.5 MG tablet Commonly known as: AVALIDE Take 1 tablet by mouth daily. In place of quinapril hctz   MENS 50+ MULTI VITAMIN/MIN PO Take 1 tablet by mouth daily.   NONFORMULARY OR COMPOUNDED ITEM Trimix (30/2/20)-(Pap/Phent/PGE)  Dosage: Inject 0.7 cc per injection  Vial 62ml  Qty #10 Refills 6  [place] [phone] Fax [phone]   pravastatin 40 MG tablet Commonly known as: PRAVACHOL Take 1 tablet (40 mg total) by mouth daily.       Allergies: No Known Allergies  Family History: Family History  Problem Relation Age of Onset  . Hypertension Mother   . CAD Mother   . Lung cancer Mother   . Benign prostatic hyperplasia Maternal Uncle   . Prostate cancer Maternal Uncle   . Hypertension Sister   . Dementia Maternal Grandfather   . Cancer Paternal Grandmother        Think stomach or intestinal  . Kidney disease Neg Hx   . Bladder Cancer Neg Hx   . Kidney cancer Neg Hx     Social History:   reports that he quit smoking about 38 years ago. His smoking use included  cigarettes. He started smoking about 40 years ago. He quit after 2.00 years of use. He quit smokeless tobacco use about 33 years ago.  His smokeless tobacco use included chew. He reports current alcohol use. He reports that he does not use drugs.  ROS: Pertinent ROS in HPI.  Physical Exam: BP 115/74   Pulse 67   Ht 5\' 10"  (1.778 m)   Wt 279 lb (126.6 kg)   BMI 40.03 kg/m   Constitutional:  Alert and oriented, No acute distress. HEENT: [place] AT, moist mucus membranes.  Trachea midline, no masses. Cardiovascular: No clubbing, cyanosis, or edema. Respiratory: Normal respiratory effort, no increased work of breathing. Skin: No  rashes, bruises or suspicious lesions. Neurologic: Grossly intact, no focal deficits, moving all 4 extremities. Psychiatric: Normal mood and affect.  Assessment & Plan:   1. Prostate cancer NED.  Reasonable to continue annual PSAs.  2. Erectile dysfunction after radical prostatectomy Doing well with super Trimix 0.8 mL-discussed alternatives including penile prosthesis  Failed to respond to be 5 inhibitors  At this point, he like to continue this medication, will continue to refill this dose and see annually  Follow Up:  RTC in a year with PSA prior   I, Ardyth Gal, am acting as a scribe for Dr. [name].   I have reviewed the above documentation for accuracy and completeness, and I agree with the above.   [name], MD    [facility] [address], [place], [place] [address] [phone]

## 2020-11-28 LAB — PSA: Prostate Specific Ag, Serum: <0.1 ng/mL (ref 0.0–4.0)

## 2020-11-29 ENCOUNTER — Ambulatory Visit (INDEPENDENT_AMBULATORY_CARE_PROVIDER_SITE_OTHER): Payer: BC Managed Care – PPO | Admitting: Urology

## 2020-11-29 ENCOUNTER — Other Ambulatory Visit: Payer: Self-pay

## 2020-11-29 ENCOUNTER — Encounter: Payer: Self-pay | Admitting: Urology

## 2020-11-29 VITALS — BP 115/74 | HR 67 | Ht 70.0 in | Wt 279.0 lb

## 2020-11-29 DIAGNOSIS — N5231 Erectile dysfunction following radical prostatectomy: Secondary | ICD-10-CM

## 2020-11-29 DIAGNOSIS — C61 Malignant neoplasm of prostate: Secondary | ICD-10-CM | POA: Diagnosis not present

## 2020-12-10 ENCOUNTER — Ambulatory Visit: Payer: BC Managed Care – PPO

## 2020-12-11 ENCOUNTER — Ambulatory Visit (INDEPENDENT_AMBULATORY_CARE_PROVIDER_SITE_OTHER): Payer: BC Managed Care – PPO

## 2020-12-11 ENCOUNTER — Other Ambulatory Visit: Payer: Self-pay

## 2020-12-11 DIAGNOSIS — Z23 Encounter for immunization: Secondary | ICD-10-CM | POA: Diagnosis not present

## 2020-12-11 NOTE — Progress Notes (Signed)
Patient here for blood  Pressure check since switching to avalide.  Pt denies any side effects, bp today is 118/82 and pulse 83.

## 2021-02-25 ENCOUNTER — Encounter: Payer: Self-pay | Admitting: Family Medicine

## 2021-02-25 ENCOUNTER — Other Ambulatory Visit: Payer: Self-pay | Admitting: Family Medicine

## 2021-02-25 MED ORDER — IRBESARTAN-HYDROCHLOROTHIAZIDE 150-12.5 MG PO TABS: 1.0000 | ORAL_TABLET | Freq: Every day | ORAL | 0 refills | Status: DC

## 2021-03-08 NOTE — Progress Notes (Signed)
Name: Jeffrey Whitaker   MRN: [id number]    DOB: [birth year]   Date:03/11/2021       Progress Note  Subjective  Chief Complaint  Follow  Up  HPI  Metabolic Syndrome: on diet and exercise, denies polyphagia, polyuria or polydipsia, last hgbA1C 6.2%. His weight is down 4 lbs today . He is seeing a trained three days a week for 45 minutes session, he has been walking on treadmill for 30 minutes at work twice a week.    HTN: he is taking medication as prescribed, denies chest pain or palpitation. BP at home was n the 130's and also at our office last time,  we changed from quinapril hctz ti Avalide 150/12.5, bp today is towards low end of normal, denies any dizziness  but we will change to Diovan hctz, lower dose   Obesity: his weight was down to 245 lbs in the beginning of 2020, but he gained weight after prostate surgery and also COVID-19 life style changes. Weight today is down from 275- BMI is below 39 again     History of prostate cancer: s/p surgery done 03/2018, under the care of Dr. Erlene Quan, he is doing well, he finished  pelvic floor PT but is still doing the exercises at home.  Last PSA was normal , staying below 0.1. He states Revatio was not working, and is now using an injection that is working well for him    Dyslipidemia: he is taking pravastatin, no myalgias, last LDL 112 , and HDL 44 triglycerides 105 We will recheck next visit and if still above 100 we will change medication   The 10-year ASCVD risk score Mikey Bussing DC Jr., et al., 2013) is: 6.9%   Values used to calculate the score:     Age: 60 years     Sex: Male     Is Non-Hispanic African American: Yes     Diabetic: No     Tobacco smoker: No     Systolic Blood Pressure: 123456 mmHg     Is BP treated: No     HDL Cholesterol: 44 mg/dL     Total Cholesterol: 177 mg/dL   Left olecranon bursitis: started 2 weeks ago, it goes up and down, not sore or red   Patient Active Problem List   Diagnosis Date Noted   Morbid obesity with  BMI of 40.0-44.9, adult ([place]) 09/10/2020   History of prostate cancer 08/23/2019   Allergic rhinitis, seasonal 04/15/2015   Benign hypertension 04/11/2015   Dyslipidemia 04/11/2015   Obesity (BMI 30.0-34.9) 04/11/2015   Right medial knee pain 99991111   Metabolic syndrome 99991111   Left shoulder pain 99991111   Umbilical hernia 99991111   Microcytosis 04/11/2015    Past Surgical History:  Procedure Laterality Date   COLONOSCOPY     KNEE SURGERY Right    at age 37; clean out   PELVIC LYMPH NODE DISSECTION Bilateral 04/26/2018   Procedure: PELVIC LYMPH NODE DISSECTION;  Surgeon: [name], MD;  Location: [facility];  Service: Urology;  Laterality: Bilateral;   ROBOT ASSISTED LAPAROSCOPIC RADICAL PROSTATECTOMY N/A 04/26/2018   Procedure: ROBOTIC ASSISTED LAPAROSCOPIC RADICAL PROSTATECTOMY;  Surgeon: [name], MD;  Location: [facility];  Service: Urology;  Laterality: N/A;    Family History  Problem Relation Age of Onset   Hypertension Mother    CAD Mother    Lung cancer Mother    Benign prostatic hyperplasia Maternal Uncle    Prostate cancer Maternal Uncle  Hypertension Sister    Dementia Maternal Grandfather    Cancer Paternal Grandmother        Think stomach or intestinal   Kidney disease Neg Hx    Bladder Cancer Neg Hx    Kidney cancer Neg Hx     Social History   Tobacco Use   Smoking status: Former    Years: 2.00    Types: Cigarettes    Start date: 07/28/1980    Quit date: 07/28/1982    Years since quitting: 38.6   Smokeless tobacco: Former    Types: Chew    Quit date: 07/29/1987  Substance Use Topics   Alcohol use: Yes    Alcohol/week: 0.0 standard drinks    Comment: rarely     Current Outpatient Medications:    irbesartan-hydrochlorothiazide (AVALIDE) 150-12.5 MG tablet, Take 1 tablet by mouth daily. In place of quinapril hctz, Disp: 90 tablet, Rfl: 1   irbesartan-hydrochlorothiazide (AVALIDE) 150-12.5 MG tablet, Take 1 tablet by mouth  daily., Disp: 14 tablet, Rfl: 0   Multiple Vitamins-Minerals (MENS 50+ MULTI VITAMIN/MIN PO), Take 1 tablet by mouth daily., Disp: , Rfl:    NONFORMULARY OR COMPOUNDED ITEM, Trimix (30/2/20)-(Pap/Phent/PGE)  Dosage: Inject 0.7 cc per injection  Vial 31m  Qty #10 Refills 6  [phone] [phone] Disp: 10 each, Rfl: 6   pravastatin (PRAVACHOL) 40 MG tablet, Take 1 tablet (40 mg total) by mouth daily., Disp: 90 tablet, Rfl: 1  No Known Allergies  I personally reviewed active problem list, medication list, allergies, family history, social history, health maintenance, notes from last encounter with the patient/caregiver today.   ROS  Constitutional: Negative for fever or weight change.  Respiratory: Negative for cough and shortness of breath.   Cardiovascular: Negative for chest pain or palpitations.  Gastrointestinal: Negative for abdominal pain, no bowel changes.  Musculoskeletal: Negative for gait problem or joint swelling.  Skin: Negative for rash.  Neurological: Negative for dizziness or headache.  No other specific complaints in a complete review of systems (except as listed in HPI above).   Objective  Vitals:   03/11/21 0811  BP: 118/66  Pulse: 85  Resp: 16  Temp: 98.4 F (36.9 C)  SpO2: 97%  Weight: 275 lb (124.7 kg)  Height: '5\' 10"'$  (1.778 m)    Body mass index is 39.46 kg/m.  Physical Exam  Constitutional: Patient appears well-developed and well-nourished. Obese  No distress.  HEENT: head atraumatic, normocephalic, pupils equal and reactive to light, neck supple Cardiovascular: Normal rate, regular rhythm and normal heart sounds.  No murmur heard. No BLE edema. Pulmonary/Chest: Effort normal and breath sounds normal. No respiratory distress. Abdominal: Soft.  There is no tenderness. Psychiatric: Patient has a normal mood and affect. behavior is normal. Judgment and thought content normal.   PHQ2/9: [facility] 03/11/2021  09/10/2020 03/14/2020 08/23/2019 05/03/2019  Decreased Interest 0 0 0 0 0  Down, Depressed, Hopeless 0 0 0 0 0  PHQ - 2 Score 0 0 0 0 0  Altered sleeping - 0 0 0 0  Tired, decreased energy - 0 0 0 0  Change in appetite - 0 0 0 0  Feeling bad or failure about yourself  - 0 0 0 0  Trouble concentrating - 0 0 0 0  Moving slowly or fidgety/restless - 0 0 0 0  Suicidal thoughts - 0 0 0 0  PHQ-9 Score - 0 0 0 0  Difficult doing work/chores - - - Not  difficult at all Not difficult at all  Some recent data might be hidden    phq 9 is negative   Fall Risk: Fall Risk  03/11/2021 09/10/2020 03/14/2020 08/23/2019 05/03/2019  Falls in the past year? 0 0 0 0 0  Number falls in past yr: 0 0 0 0 0  Injury with Fall? 0 0 0 0 0  Follow up - - - Falls evaluation completed -     Functional Status Survey: Is the patient deaf or have difficulty hearing?: No Does the patient have difficulty seeing, even when wearing glasses/contacts?: No Does the patient have difficulty concentrating, remembering, or making decisions?: No Does the patient have difficulty walking or climbing stairs?: No Does the patient have difficulty dressing or bathing?: No Does the patient have difficulty doing errands alone such as visiting a doctor's office or shopping?: No    Assessment & Plan  1. Metabolic syndrome   2. Dyslipidemia   3. Benign hypertension  - valsartan-hydrochlorothiazide (DIOVAN-HCT) 80-12.5 MG tablet; Take 1 tablet by mouth daily. In place of Avalide HCTZ  Dispense: 90 tablet; Refill: 1   4. Morbid obesity ([place])  Discussed with the patient the risk posed by an increased BMI. Discussed importance of portion control, calorie counting and at least 150 minutes of physical activity weekly. Avoid sweet beverages and drink more water. Eat at least 6 servings of fruit and vegetables daily    5. Obesity (BMI 30-39.9)   6. History of prostate cancer   7. ED (erectile dysfunction) of organic  origin  Doing better  8. Olecranon bursitis of left elbow   Reassurance given, call for referral to ortho if interested

## 2021-03-11 ENCOUNTER — Ambulatory Visit (INDEPENDENT_AMBULATORY_CARE_PROVIDER_SITE_OTHER): Payer: BC Managed Care – PPO | Admitting: Family Medicine

## 2021-03-11 ENCOUNTER — Other Ambulatory Visit: Payer: Self-pay

## 2021-03-11 ENCOUNTER — Encounter: Payer: Self-pay | Admitting: Family Medicine

## 2021-03-11 VITALS — BP 118/66 | HR 85 | Temp 98.4°F | Resp 16 | Ht 70.0 in | Wt 275.0 lb

## 2021-03-11 DIAGNOSIS — E8881 Metabolic syndrome: Secondary | ICD-10-CM | POA: Diagnosis not present

## 2021-03-11 DIAGNOSIS — I10 Essential (primary) hypertension: Secondary | ICD-10-CM | POA: Diagnosis not present

## 2021-03-11 DIAGNOSIS — N529 Male erectile dysfunction, unspecified: Secondary | ICD-10-CM

## 2021-03-11 DIAGNOSIS — M7022 Olecranon bursitis, left elbow: Secondary | ICD-10-CM

## 2021-03-11 DIAGNOSIS — E669 Obesity, unspecified: Secondary | ICD-10-CM

## 2021-03-11 DIAGNOSIS — E785 Hyperlipidemia, unspecified: Secondary | ICD-10-CM | POA: Diagnosis not present

## 2021-03-11 DIAGNOSIS — Z8546 Personal history of malignant neoplasm of prostate: Secondary | ICD-10-CM

## 2021-03-11 MED ORDER — VALSARTAN-HYDROCHLOROTHIAZIDE 80-12.5 MG PO TABS: 1.0000 | ORAL_TABLET | Freq: Every day | ORAL | 1 refills | Status: DC

## 2021-03-11 NOTE — Patient Instructions (Signed)
Elbow Bursitis Rehab Ask your health care provider which exercises are safe for you. Do exercises exactly as told by your health care provider and adjust them as directed. It is normal to feel mild stretching, pulling, tightness, or discomfort as you do these exercises. Stop right away if you feel sudden pain or your pain gets worse. Do not begin these exercises until told by your health care provider. Stretching and range-of-motion exercises These exercises warm up your muscles and joints and improve the movement andflexibility of your elbow. The exercises also help to relieve pain and swelling. Elbow flexion, assisted Stand or sit with your left / right arm at your side. Use your other hand to gently push your left / right hand toward your shoulder (assisted) while bending your elbow (flexion). Hold this position for __________ seconds. Slowly return your left / right arm to the starting position. Repeat __________ times. Complete this exercise __________ times a day. Elbow extension, assisted Lie on your back in a comfortable position that allows you to relax your arm muscles. Place a folded towel under your left / right upper arm so that your elbow and shoulder are at the same height. Use your other arm to raise your left / right arm (assisted) until your elbow and hand do not rest on the bed or towel. Hold your left / right arm out straight with your other hand supporting it. Let the weight of your hand straighten your elbow (extension). You should feel a stretch on the inside of your elbow. Keep your arm and chest muscles relaxed. If directed, add a small wrist weight or hand weight to increase the stretch. Hold this position for __________ seconds. Slowly release the stretch and return to the starting position. Repeat __________ times. Complete this exercise __________ times a day. Elbow flexion, active Stand or sit with your left / right elbow bent and your palm facing in, toward your  body. Bend your elbow as far as you can using only your arm muscles (active flexion). Hold this position for __________ seconds. Slowly return to the starting position. Repeat __________ times. Complete this exercise __________ times a day. Elbow extension, active Stand or sit with your left / right elbow bent and your palm facing in, toward your body. Slowly straighten your elbow using only your arm muscles (active extension). Stop when you feel a gentle stretch at the front of your arm, or when your arm is straight. Hold this position for __________ seconds. Slowly return to the starting position. Repeat __________ times. Complete this exercise __________ times a day. Strengthening exercises These exercises build strength and endurance in your elbow. Endurance is theability to use your muscles for a long time, even after they get tired. Elbow flexion, isometric Stand or sit with your left / right arm at waist height. Your palm should face in, toward your body. Place your other hand on top of your left / right forearm. Gently push down while you resist with your left / right arm (isometric flexion). Use about 50% effort with both arms. You may be instructed to use more and more effort with your arms each week. Try not to let your left / right arm move during the exercise. Hold this position for __________ seconds. Let your muscles relax completely before you repeat the exercise. Repeat __________ times. Complete this exercise __________ times a day. Elbow extension, isometric  Stand or sit with your left / right arm at waist height. Your palm should face in,  toward your body. Place your other hand on the bottom of your left / right forearm. Gently push up while you resist with your left / right arm (isometric extension). Use about 50% effort with both arms. You may be instructed to use more and more effort with your arms each week. Try not to let your left / right arm move during the  exercise. Hold this position for __________ seconds. Let your muscles relax completely before you repeat the exercise. Repeat __________ times. Complete this exercise __________ times a day. Biceps curls Sit on a stable chair without armrests, or stand up. Hold a _________ weight in your left / right hand. Your palm should face out, away from your body, at the starting position. Bend your left / right elbow and move your hand up toward your shoulder. Keep your elbow at your side while you bend it. Slowly return to the starting position. Repeat __________ times. Complete this exercise __________ times a day. Triceps curls  Lie on your back. Hold a _________ weight in your left / right hand. Bend your left / right elbow to a 90-degree angle (right angle), so the weight is in front of your face, over your chest, and your elbow is pointed up to the ceiling. Straighten your elbow, raising your hand toward the ceiling. Use your other hand to support your left / right upper arm and to keep it still. Slowly return to the starting position. Repeat __________ times. Complete this exercise __________ times a day. This information is not intended to replace advice given to you by your health care provider. Make sure you discuss any questions you have with your healthcare provider. Document Revised: 11/04/2018 Document Reviewed: 08/04/2018 Elsevier Patient Education  [place].

## 2021-04-23 ENCOUNTER — Other Ambulatory Visit: Payer: Self-pay | Admitting: Family Medicine

## 2021-04-23 DIAGNOSIS — E785 Hyperlipidemia, unspecified: Secondary | ICD-10-CM

## 2021-05-08 ENCOUNTER — Encounter: Payer: Self-pay | Admitting: Urology

## 2021-05-08 DIAGNOSIS — C61 Malignant neoplasm of prostate: Secondary | ICD-10-CM

## 2021-05-09 MED ORDER — NONFORMULARY OR COMPOUNDED ITEM
6 refills | Status: DC
Start: 2021-05-09 — End: 2021-05-16

## 2021-05-09 MED ORDER — NONFORMULARY OR COMPOUNDED ITEM: 6 refills | Status: DC

## 2021-05-16 MED ORDER — NONFORMULARY OR COMPOUNDED ITEM: 6 refills | Status: DC

## 2021-05-16 NOTE — Addendum Note (Signed)
Addended by: Verlene Mayer A on: 05/16/2021 02:13 PM   Modules accepted: Orders

## 2021-08-26 ENCOUNTER — Other Ambulatory Visit: Payer: Self-pay

## 2021-08-26 ENCOUNTER — Encounter: Payer: Self-pay | Admitting: Family Medicine

## 2021-08-26 DIAGNOSIS — I10 Essential (primary) hypertension: Secondary | ICD-10-CM

## 2021-08-26 MED ORDER — VALSARTAN-HYDROCHLOROTHIAZIDE 80-12.5 MG PO TABS: 1.0000 | ORAL_TABLET | Freq: Every day | ORAL | 0 refills | Status: DC

## 2021-09-11 NOTE — Progress Notes (Signed)
Name: Jeffrey Whitaker   MRN: [id number]    DOB: [birth year]   Date:09/12/2021       Progress Note  Subjective  Chief Complaint  Annual Exam  HPI  Patient presents for annual CPE and follow up - discussed possible additional cost   Metabolic Syndrome: on diet and exercise, denies polyphagia, polyuria or polydipsia, last hgbA1C 6.2%. He has gained weight since last visit. He just recently resumed going to the gym with personal trainer and also hiking again. He was not as strict eating earlier in the evening. He is trying to pack his lunch again.    HTN: he is taking medication as prescribed, denies chest pain or palpitation. BP has not been checked at home lately. He is taking valsartan hctz bp is at goal but a little higher than last visit  He denies side effects of medication .   Morbid Obesity: his weight was down to 245 lbs in the beginning of 2020, his weight is up to 287 lbs, BMI is 40 again. He has recently made changes to his life style    History of prostate cancer: s/p surgery done 03/2018, under the care of Dr. Erlene Quan, he is doing well, he finished  pelvic floor PT but is still doing the exercises at home.  Last PSA was normal , staying below 0.1. He states Revatio was not working, and is now using an injection that is working well for him Unchanged   Dyslipidemia: he is taking pravastatin, no myalgias, last LDL 112 , and HDL 44 triglycerides 105.  We will recheck labs today    The 10-year ASCVD risk score (Arnett DK, et al., 2019) is: 14.2%   Values used to calculate the score:     Age: 61 years     Sex: Male     Is Non-Hispanic African American: Yes     Diabetic: No     Tobacco smoker: No     Systolic Blood Pressure: 735 mmHg     Is BP treated: Yes     HDL Cholesterol: 44 mg/dL     Total Cholesterol: 177 mg/dL  Left olecranon bursitis: started 2 weeks ago, it goes up and down, not sore or red    IPSS Questionnaire (AUA-7): Over the past month   1)  How often have you  had a sensation of not emptying your bladder completely after you finish urinating?  0 - Not at all  2)  How often have you had to urinate again less than two hours after you finished urinating? 0 - Not at all  3)  How often have you found you stopped and started again several times when you urinated?  0 - Not at all  4) How difficult have you found it to postpone urination?  0 - Not at all  5) How often have you had a weak urinary stream?  0 - Not at all  6) How often have you had to push or strain to begin urination?  0 - Not at all  7) How many times did you most typically get up to urinate from the time you went to bed until the time you got up in the morning?  1 - 1 time  Total score:  0-7 mildly symptomatic   8-19 moderately symptomatic   20-35 severely symptomatic     Diet: he cooks at home, he has recently started packing lunch more often for work  Exercise: he just resumed exercise 4 days  a week, including a long hike on the weekend   Depression: phq 9 is negative Depression screen [facility] 2/9 09/12/2021 03/11/2021 09/10/2020 03/14/2020 08/23/2019  Decreased Interest 0 0 0 0 0  Down, Depressed, Hopeless 0 0 0 0 0  PHQ - 2 Score 0 0 0 0 0  Altered sleeping 0 - 0 0 0  Tired, decreased energy 0 - 0 0 0  Change in appetite 0 - 0 0 0  Feeling bad or failure about yourself  0 - 0 0 0  Trouble concentrating 0 - 0 0 0  Moving slowly or fidgety/restless 0 - 0 0 0  Suicidal thoughts 0 - 0 0 0  PHQ-9 Score 0 - 0 0 0  Difficult doing work/chores - - - - Not difficult at all  Some recent data might be hidden    Hypertension:  BP Readings from Last 3 Encounters:  09/12/21 132/84  03/11/21 118/66  11/29/20 115/74    Obesity: Wt Readings from Last 3 Encounters:  09/12/21 287 lb (130.2 kg)  03/11/21 275 lb (124.7 kg)  11/29/20 279 lb (126.6 kg)   BMI Readings from Last 3 Encounters:  09/12/21 40.03 kg/m  03/11/21 39.46 kg/m  11/29/20 40.03 kg/m     Lipids:  Lab Results   Component Value Date   CHOL 177 09/10/2020   CHOL 140 03/09/2019   CHOL 150 02/18/2018   Lab Results  Component Value Date   HDL 44 09/10/2020   HDL 49 03/09/2019   HDL 53 02/18/2018   Lab Results  Component Value Date   LDLCALC 112 (H) 09/10/2020   LDLCALC 76 03/09/2019   LDLCALC 82 02/18/2018   Lab Results  Component Value Date   TRIG 105 09/10/2020   TRIG 73 03/09/2019   TRIG 77 02/18/2018   Lab Results  Component Value Date   CHOLHDL 4.0 09/10/2020   CHOLHDL 2.9 03/09/2019   CHOLHDL 2.8 02/18/2018   No results found for: LDLDIRECT Glucose:  Glucose  Date Value Ref Range Status  03/09/2019 96 65 - 99 mg/dL Final  08/27/2018 98 65 - 99 mg/dL Final   Glucose, Bld  Date Value Ref Range Status  09/10/2020 103 (H) 65 - 99 mg/dL Final    Comment:    .            Fasting reference interval . For someone without known diabetes, a glucose value between 100 and 125 mg/dL is consistent with prediabetes and should be confirmed with a follow-up test. .   04/27/2018 130 (H) 70 - 99 mg/dL Final  04/19/2018 102 (H) 70 - 99 mg/dL Final    [place] [facility] from [facility]/14/2020 in [facility]  AUDIT-C Score 0      Married STD testing and prevention (HIV/chl/gon/syphilis):  08/27/18 Hep C Screening: 10/05/12 Skin cancer: Discussed monitoring for atypical lesions Colorectal cancer: 08/28/12 Prostate cancer:  history of prostate cancer and has regular follow ups with Urologist   Lab Results  Component Value Date   PSA 3.2 12/12/2014     Lung cancer:  Low Dose CT Chest recommended if Age 21-80 years, 30 pack-year currently smoking OR have quit w/in 15years. Patient  no AAA: The USPSTF recommends one-time screening with ultrasonography in men ages 36 to 38 years who have ever smoked. Patient:  not applicable ECG:  8/78/67  Vaccines:   HPV: N/A Tdap: up to date Shingrix: up to date Pneumonia: N/A Flu: today  COVID-19: up to  date  Advanced Care Planning: A voluntary discussion about advance care planning including the explanation and discussion of advance directives.  Discussed health care proxy and Living will, and the patient was able to identify a health care proxy as wife.  Patient does not   Patient Active Problem List   Diagnosis Date Noted   Morbid obesity with BMI of 40.0-44.9, adult ([place]) 09/10/2020   History of prostate cancer 08/23/2019   Allergic rhinitis, seasonal 04/15/2015   Benign hypertension 04/11/2015   Dyslipidemia 04/11/2015   Obesity (BMI 30.0-34.9) 04/11/2015   Right medial knee pain 00/86/7619   Metabolic syndrome 50/93/2671   Left shoulder pain 24/58/0998   Umbilical hernia 33/82/5053   Microcytosis 04/11/2015    Past Surgical History:  Procedure Laterality Date   COLONOSCOPY     KNEE SURGERY Right    at age 55; clean out   PELVIC LYMPH NODE DISSECTION Bilateral 04/26/2018   Procedure: PELVIC LYMPH NODE DISSECTION;  Surgeon: [name], MD;  Location: [facility] ORS;  Service: Urology;  Laterality: Bilateral;   ROBOT ASSISTED LAPAROSCOPIC RADICAL PROSTATECTOMY N/A 04/26/2018   Procedure: ROBOTIC ASSISTED LAPAROSCOPIC RADICAL PROSTATECTOMY;  Surgeon: [name], MD;  Location: [facility] ORS;  Service: Urology;  Laterality: N/A;    Family History  Problem Relation Age of Onset   Hypertension Mother    CAD Mother    Lung cancer Mother    Benign prostatic hyperplasia Maternal Uncle    Prostate cancer Maternal Uncle    Hypertension Sister    Dementia Maternal Grandfather    Cancer Paternal Grandmother        Think stomach or intestinal   Kidney disease Neg Hx    Bladder Cancer Neg Hx    Kidney cancer Neg Hx     Social History   Socioeconomic History   Marital status: Married    Spouse name: Santiago Glad   Number of children: 2   Years of education: Not on file   Highest education level: Associate degree: occupational, Hotel manager, or vocational program  Occupational History    Not on file  Tobacco Use   Smoking status: Former    Years: 2.00    Types: Cigarettes    Start date: 07/28/1980    Quit date: 07/28/1982    Years since quitting: 39.1   Smokeless tobacco: Former    Types: Chew    Quit date: 07/29/1987  Vaping Use   Vaping Use: Never used  Substance and Sexual Activity   Alcohol use: Yes    Alcohol/week: 0.0 standard drinks    Comment: rarely   Drug use: No   Sexual activity: Yes    Partners: Female  Other Topics Concern   Not on file  Social History Narrative   Patient has 2 step-children   Social Determinants of Health   Financial Resource Strain: Low Risk    Difficulty of Paying Living Expenses: Not hard at all  Food Insecurity: No Food Insecurity   Worried About Charity fundraiser in the Last Year: Never true   [place] in the Last Year: Never true  Transportation Needs: No Transportation Needs   Lack of Transportation (Medical): No   Lack of Transportation (Non-Medical): No  Physical Activity: Insufficiently Active   Days of Exercise per Week: 3 days   Minutes of Exercise per Session: 40 min  Stress: No Stress Concern Present   Feeling of Stress : Not at all  Social Connections: Moderately Integrated   Frequency of Communication  with Friends and Family: More than three times a week   Frequency of Social Gatherings with Friends and Family: Three times a week   Attends Religious Services: Never   Active Member of Clubs or Organizations: Yes   Attends Music therapist: More than 4 times per year   Marital Status: Married  Human resources officer Violence: Not At Risk   Fear of Current or Ex-Partner: No   Emotionally Abused: No   Physically Abused: No   Sexually Abused: No     Current Outpatient Medications:    Multiple Vitamins-Minerals (MENS 50+ MULTI VITAMIN/MIN PO), Take 1 tablet by mouth daily., Disp: , Rfl:    NONFORMULARY OR COMPOUNDED ITEM, Trimix (30/2/20)-(Pap/Phent/PGE)  Dosage: Inject 0.7 cc per  injection  Vial 57ml  Qty #10 Refills 6  [place] [phone] Fax [phone], Disp: 10 each, Rfl: 6   pravastatin (PRAVACHOL) 40 MG tablet, TAKE 1 TABLET DAILY, Disp: 90 tablet, Rfl: 1   valsartan-hydrochlorothiazide (DIOVAN-HCT) 80-12.5 MG tablet, Take 1 tablet by mouth daily. In place of Avalide HCTZ, Disp: 90 tablet, Rfl: 0  No Known Allergies   ROS\  Constitutional: Negative for fever , positive for  weight change.  Respiratory: Negative for cough and shortness of breath.   Cardiovascular: Negative for chest pain or palpitations.  Gastrointestinal: Negative for abdominal pain, no bowel changes.  Musculoskeletal: Negative for gait problem or joint swelling.  Skin: Negative for rash.  Neurological: Negative for dizziness or headache.  No other specific complaints in a complete review of systems (except as listed in HPI above).    Objective  Vitals:   09/12/21 0844  BP: 132/84  Pulse: 95  Resp: 16  SpO2: 98%  Weight: 287 lb (130.2 kg)  Height: 5\' 11"  (1.803 m)    Body mass index is 40.03 kg/m.  Physical Exam  Constitutional: Patient appears well-developed and well-nourished. No distress.  HENT: Head: Normocephalic and atraumatic. Ears: B TMs ok, no erythema or effusion; Nose: Not done . Mouth/Throat: not done  Eyes: Conjunctivae and EOM are normal. Pupils are equal, round, and reactive to light. No scleral icterus.  Neck: Normal range of motion. Neck supple. No JVD present. No thyromegaly present.  Cardiovascular: Normal rate, regular rhythm and normal heart sounds.  No murmur heard. No BLE edema. Pulmonary/Chest: Effort normal and breath sounds normal. No respiratory distress. Abdominal: Soft. Bowel sounds are normal, no distension. There is no tenderness. no masses MALE GENITALIA: Normal descended testes bilaterally, no masses palpated, no hernias, no lesions, no discharge RECTAL: Not done  Musculoskeletal: Normal range of motion, no joint effusions. No  gross deformities Neurological: he is alert and oriented to person, place, and time. No cranial nerve deficit. Coordination, balance, strength, speech and gait are normal.  Skin: Skin is warm and dry. No rash noted. No erythema.  Psychiatric: Patient has a normal mood and affect. behavior is normal. Judgment and thought content normal.    Fall Risk: Fall Risk  09/12/2021 03/11/2021 09/10/2020 03/14/2020 08/23/2019  Falls in the past year? 0 0 0 0 0  Number falls in past yr: 0 0 0 0 0  Injury with Fall? 0 0 0 0 0  Risk for fall due to : No Fall Risks - - - -  Follow up Falls prevention discussed - - - Falls evaluation completed     Functional Status Survey: Is the patient deaf or have difficulty hearing?: No Does the patient have difficulty seeing, even when wearing glasses/contacts?:  No Does the patient have difficulty concentrating, remembering, or making decisions?: No Does the patient have difficulty walking or climbing stairs?: No Does the patient have difficulty dressing or bathing?: No Does the patient have difficulty doing errands alone such as visiting a doctor's office or shopping?: No    Assessment & Plan  1. Morbid obesity ([place])  Discussed with the patient the risk posed by an increased BMI. Discussed importance of portion control, calorie counting and at least 150 minutes of physical activity weekly. Avoid sweet beverages and drink more water. Eat at least 6 servings of fruit and vegetables daily    2. Well adult exam  - Lipid panel - CBC with Differential/Platelet - COMPLETE METABOLIC PANEL WITH GFR - Hemoglobin A1c  3. Need for immunization against influenza  - Flu Vaccine QUAD 6+ mos PF IM (Fluarix Quad PF)  4. Dyslipidemia  - Lipid panel  5. Benign hypertension  - CBC with Differential/Platelet - COMPLETE METABOLIC PANEL WITH GFR  6. ED (erectile dysfunction) of organic origin   7. Metabolic syndrome  - Hemoglobin A1c  8. History of prostate  cancer   Keep follow up with Urologist   -Prostate cancer screening and PSA options (with potential risks and benefits of testing vs not testing) were discussed along with recent recs/guidelines. -USPSTF grade A and B recommendations reviewed with patient; age-appropriate recommendations, preventive care, screening tests, etc discussed and encouraged; healthy living encouraged; see AVS for patient education given to patient -Discussed importance of 150 minutes of physical activity weekly, eat two servings of fish weekly, eat one serving of tree nuts ( cashews, pistachios, pecans, almonds.Marland Kitchen) every other day, eat 6 servings of fruit/vegetables daily and drink plenty of water and avoid sweet beverages.  -Reviewed Health Maintenance: yes

## 2021-09-12 ENCOUNTER — Encounter: Payer: Self-pay | Admitting: Family Medicine

## 2021-09-12 ENCOUNTER — Ambulatory Visit (INDEPENDENT_AMBULATORY_CARE_PROVIDER_SITE_OTHER): Payer: BC Managed Care – PPO | Admitting: Family Medicine

## 2021-09-12 DIAGNOSIS — N529 Male erectile dysfunction, unspecified: Secondary | ICD-10-CM

## 2021-09-12 DIAGNOSIS — Z23 Encounter for immunization: Secondary | ICD-10-CM

## 2021-09-12 DIAGNOSIS — I10 Essential (primary) hypertension: Secondary | ICD-10-CM | POA: Diagnosis not present

## 2021-09-12 DIAGNOSIS — E8881 Metabolic syndrome: Secondary | ICD-10-CM

## 2021-09-12 DIAGNOSIS — E785 Hyperlipidemia, unspecified: Secondary | ICD-10-CM | POA: Diagnosis not present

## 2021-09-12 DIAGNOSIS — Z0000 Encounter for general adult medical examination without abnormal findings: Secondary | ICD-10-CM

## 2021-09-12 DIAGNOSIS — Z8546 Personal history of malignant neoplasm of prostate: Secondary | ICD-10-CM

## 2021-09-12 NOTE — Patient Instructions (Signed)

## 2021-09-13 LAB — CBC WITH DIFFERENTIAL/PLATELET
Absolute Monocytes: 600 cells/uL (ref 200–950)
Basophils Absolute: 62 cells/uL (ref 0–200)
Basophils Relative: 0.9 %
Eosinophils Absolute: 159 cells/uL (ref 15–500)
Eosinophils Relative: 2.3 %
HCT: 45.6 % (ref 38.5–50.0)
Hemoglobin: 13.9 g/dL (ref 13.2–17.1)
Lymphs Abs: 1518 cells/uL (ref 850–3900)
MCH: 23.5 pg — ABNORMAL LOW (ref 27.0–33.0)
MCHC: 30.5 g/dL — ABNORMAL LOW (ref 32.0–36.0)
MCV: 77.2 fL — ABNORMAL LOW (ref 80.0–100.0)
MPV: 11.3 fL (ref 7.5–12.5)
Monocytes Relative: 8.7 %
Neutro Abs: 4561 cells/uL (ref 1500–7800)
Neutrophils Relative %: 66.1 %
Platelets: 210 10*3/uL (ref 140–400)
RBC: 5.91 10*6/uL — ABNORMAL HIGH (ref 4.20–5.80)
RDW: 13.6 % (ref 11.0–15.0)
Total Lymphocyte: 22 %
WBC: 6.9 10*3/uL (ref 3.8–10.8)

## 2021-09-13 LAB — COMPLETE METABOLIC PANEL WITH GFR
AG Ratio: 1.4 (calc) (ref 1.0–2.5)
ALT: 22 U/L (ref 9–46)
AST: 17 U/L (ref 10–35)
Albumin: 4.2 g/dL (ref 3.6–5.1)
Alkaline phosphatase (APISO): 50 U/L (ref 35–144)
BUN: 18 mg/dL (ref 7–25)
CO2: 28 mmol/L (ref 20–32)
Calcium: 9.5 mg/dL (ref 8.6–10.3)
Chloride: 105 mmol/L (ref 98–110)
Creat: 1.26 mg/dL (ref 0.70–1.35)
Globulin: 3 g/dL (calc) (ref 1.9–3.7)
Glucose, Bld: 91 mg/dL (ref 65–99)
Potassium: 4.1 mmol/L (ref 3.5–5.3)
Sodium: 141 mmol/L (ref 135–146)
Total Bilirubin: 0.5 mg/dL (ref 0.2–1.2)
Total Protein: 7.2 g/dL (ref 6.1–8.1)
eGFR: 65 mL/min/{1.73_m2} (ref >=60)

## 2021-09-13 LAB — HEMOGLOBIN A1C
Hgb A1c MFr Bld: 6 % of total Hgb — ABNORMAL HIGH (ref <5.7)
Mean Plasma Glucose: 126 mg/dL
eAG (mmol/L): 7 mmol/L

## 2021-09-13 LAB — LIPID PANEL
Cholesterol: 149 mg/dL (ref <200)
HDL: 47 mg/dL (ref >=40)
LDL Cholesterol (Calc): 82 mg/dL (calc)
Non-HDL Cholesterol (Calc): 102 mg/dL (calc) (ref <130)
Total CHOL/HDL Ratio: 3.2 (calc) (ref <5.0)
Triglycerides: 108 mg/dL (ref <150)

## 2021-10-21 ENCOUNTER — Other Ambulatory Visit: Payer: Self-pay | Admitting: Family Medicine

## 2021-10-21 DIAGNOSIS — E785 Hyperlipidemia, unspecified: Secondary | ICD-10-CM

## 2021-11-28 ENCOUNTER — Other Ambulatory Visit: Payer: Self-pay

## 2021-11-28 DIAGNOSIS — C61 Malignant neoplasm of prostate: Secondary | ICD-10-CM

## 2021-11-29 ENCOUNTER — Other Ambulatory Visit: Payer: BC Managed Care – PPO

## 2021-11-29 DIAGNOSIS — C61 Malignant neoplasm of prostate: Secondary | ICD-10-CM

## 2021-11-30 LAB — PSA: Prostate Specific Ag, Serum: <0.1 ng/mL (ref 0.0–4.0)

## 2021-12-02 NOTE — Progress Notes (Signed)
? ?12/03/2021 ?1:11 PM  ? ?Jeffrey Whitaker ?[birth year] ?[id number] ? ?Referring provider:  ?Jeffrey Sizer, MD ?[address] ?[address] ?[place],  [place] [address] ?No chief complaint on file. ? ? ? ? ?HPI: ?Jeffrey Whitaker is a 61 y.o.male with a personal history of prostate cancer and ED after radical prostatectomy managed on Trimix, who presents today for a 1 year follow-up with PSA.  ? ?He is s/p uncomplicated partial nerve sparing procedure on 04/16/2018. Surgical pathology showed Gleason 3+4 prostate cancer without evidence of extracapsular extension.  Margins negative.  No seminal vesicle and bladder involvement.  Lymph nodes negative. pT2 N0. ? ?His PSA remains undetectable on 11/29/2021. ?  ?He is still is using super trimix he is using 0.7 and it has been beneficial to his erections.  No issues or pain with this.  He is pleased. ? ? ? ?PMH: ?Past Medical History:  ?Diagnosis Date  ? Elevated PSA   ? Hypertension   ? Prostate cancer ([place]) 2019  ? Umbilical hernia 3893  ? ? ?Surgical History: ?Past Surgical History:  ?Procedure Laterality Date  ? COLONOSCOPY    ? KNEE SURGERY Right   ? at age 73; clean out  ? PELVIC LYMPH NODE DISSECTION Bilateral 04/26/2018  ? Procedure: PELVIC LYMPH NODE DISSECTION;  Surgeon: Jeffrey Espy, MD;  Location: [facility] ORS;  Service: Urology;  Laterality: Bilateral;  ? ROBOT ASSISTED LAPAROSCOPIC RADICAL PROSTATECTOMY N/A 04/26/2018  ? Procedure: ROBOTIC ASSISTED LAPAROSCOPIC RADICAL PROSTATECTOMY;  Surgeon: Jeffrey Espy, MD;  Location: [facility] ORS;  Service: Urology;  Laterality: N/A;  ? ? ?Home Medications:  ?Allergies as of 12/03/2021   ?No Known Allergies ?  ? ?  ?Medication List  ?  ? ?  ? Accurate as of Dec 02, 2021  1:11 PM. If you have any questions, ask your nurse or doctor.  ?  ?  ? ?  ? ?MENS 50+ MULTI VITAMIN/MIN PO ?Take 1 tablet by mouth daily. ?  ?NONFORMULARY OR COMPOUNDED ITEM ?Trimix (30/2/20)-(Pap/Phent/PGE) ? ?Dosage: Inject 0.7 cc per injection ? ?Vial 33m ? ?Qty  #10 Refills 6 ? ?[facility] ?[phone]?Fax [phone]?  ?pravastatin 40 MG tablet ?Commonly known as: PRAVACHOL ?TAKE 1 TABLET DAILY ?  ?valsartan-hydrochlorothiazide 80-12.5 MG tablet ?Commonly known as: DIOVAN-HCT ?Take 1 tablet by mouth daily. In place of Avalide HCTZ ?  ? ?  ? ? ?Allergies:  ?No Known Allergies ? ?Family History: ?Family History  ?Problem Relation Age of Onset  ? Hypertension Mother   ? CAD Mother   ? Lung cancer Mother   ? Benign prostatic hyperplasia Maternal Uncle   ? Prostate cancer Maternal Uncle   ? Hypertension Sister   ? Dementia Maternal Grandfather   ? Cancer Paternal Grandmother   ?     Think stomach or intestinal  ? Kidney disease Neg Hx   ? Bladder Cancer Neg Hx   ? Kidney cancer Neg Hx   ? ? ?Social History:  reports that he quit smoking about 39 years ago. His smoking use included cigarettes. He started smoking about 41 years ago. He quit smokeless tobacco use about 34 years ago.  His smokeless tobacco use included chew. He reports current alcohol use. He reports that he does not use drugs. ? ? ?Physical Exam: ?There were no vitals taken for this visit.  ?Constitutional:  Alert and oriented, No acute distress. ?HEENT: [place] AT, moist mucus membranes.  Trachea midline, no masses. ?Cardiovascular: No clubbing, cyanosis, or edema. ?  Respiratory: Normal respiratory effort, no increased work of breathing. ?Skin: No rashes, bruises or suspicious lesions. ?Neurologic: Grossly intact, no focal deficits, moving all 4 extremities. ?Psychiatric: Normal mood and affect. ? ?Laboratory Data: ?Lab Results  ?Component Value Date  ? CREATININE 1.26 09/12/2021  ? ?Lab Results  ?Component Value Date  ? HGBA1C 6.0 (H) 09/12/2021  ? ? ?Assessment & Plan:   ?Prostate cancer  ?- NED.  ?- In light of 4 years without reoccurrence it is reasonable to continue annual PSAs.  ? ?2. Erectile dysfunction after radical prostatectomy  ?- Doing well with super Trimix up  to 0.8 mL ?- Continue this  medication; will continue to refill this dose as needed ?-Discussed alternative options, not interested in prosthesis ? ?Follow-up annually with PSA ? ?I,Kailey Littlejohn,acting as a Education administrator for Jeffrey Espy, MD.,have documented all relevant documentation on the behalf of Jeffrey Espy, MD,as directed by  Jeffrey Espy, MD while in the presence of Jeffrey Espy, MD. ? ?I have reviewed the above documentation for accuracy and completeness, and I agree with the above.  ? ?Jeffrey Espy, MD ? ?[facility] ?[address], [address] ?[place], [place] [address] ?[phone] ?

## 2021-12-03 ENCOUNTER — Encounter: Payer: Self-pay | Admitting: Urology

## 2021-12-03 ENCOUNTER — Other Ambulatory Visit: Payer: Self-pay

## 2021-12-03 ENCOUNTER — Ambulatory Visit (INDEPENDENT_AMBULATORY_CARE_PROVIDER_SITE_OTHER): Payer: BC Managed Care – PPO | Admitting: Urology

## 2021-12-03 VITALS — BP 123/81 | HR 73 | Ht 71.0 in | Wt 287.0 lb

## 2021-12-03 DIAGNOSIS — Z8546 Personal history of malignant neoplasm of prostate: Secondary | ICD-10-CM

## 2021-12-03 DIAGNOSIS — N5231 Erectile dysfunction following radical prostatectomy: Secondary | ICD-10-CM

## 2021-12-03 DIAGNOSIS — C61 Malignant neoplasm of prostate: Secondary | ICD-10-CM

## 2021-12-03 DIAGNOSIS — I10 Essential (primary) hypertension: Secondary | ICD-10-CM

## 2021-12-03 MED ORDER — VALSARTAN-HYDROCHLOROTHIAZIDE 80-12.5 MG PO TABS: 1.0000 | ORAL_TABLET | Freq: Every day | ORAL | 0 refills | Status: DC

## 2022-02-27 ENCOUNTER — Other Ambulatory Visit: Payer: Self-pay | Admitting: Family Medicine

## 2022-02-27 DIAGNOSIS — I10 Essential (primary) hypertension: Secondary | ICD-10-CM

## 2022-03-02 ENCOUNTER — Other Ambulatory Visit: Payer: Self-pay | Admitting: Family Medicine

## 2022-03-02 DIAGNOSIS — I10 Essential (primary) hypertension: Secondary | ICD-10-CM

## 2022-03-03 MED ORDER — VALSARTAN-HYDROCHLOROTHIAZIDE 80-12.5 MG PO TABS: 1.0000 | ORAL_TABLET | Freq: Every day | ORAL | 0 refills | Status: DC

## 2022-03-03 NOTE — Telephone Encounter (Signed)
Pt already scheduled on 8/16 for follow up appointment.

## 2022-03-11 NOTE — Progress Notes (Unsigned)
Name: Jeffrey Whitaker   MRN: [id number]    DOB: 1960-11-24   Date:03/12/2022       Progress Note  Subjective  Chief Complaint  Follow Up  HPI  Metabolic Syndrome: on diet and exercise, denies polyphagia, polyuria or polydipsia, A1C was up to 6.2 % but last visit down to 6 %, he has a sedentary job , still going to the gym, but upset about weight gain    HTN: he is taking medication as prescribed, denies chest pain or palpitation. BP is at goal.   Morbid Obesity: his weight was down to 245 lbs in the beginning of 2020, his weight is up to 292  lbs, BMI is over 40 . Discussed weight loss medication, and possible side effects. He denies personal history of pancreatitis or family history of thyroid cancer.    History of prostate cancer: s/p surgery done 03/2018, under the care of Dr. Erlene Quan, he is doing well, he finished  pelvic floor PT but is still doing the exercises at home.  Last PSA was normal , staying below 0.1. He is on Trimix for ED    Dyslipidemia: he is taking pravastatin, no myalgias, last LDL improved down from 112 to 82      Patient Active Problem List   Diagnosis Date Noted   Morbid obesity with BMI of 40.0-44.9, adult ([place]) 09/10/2020   History of prostate cancer 08/23/2019   Allergic rhinitis, seasonal 04/15/2015   Benign hypertension 04/11/2015   Dyslipidemia 04/11/2015   Obesity (BMI 30.0-34.9) 04/11/2015   Right medial knee pain 02/54/2706   Metabolic syndrome 23/76/2831   Left shoulder pain 51/76/1607   Umbilical hernia 37/04/6268   Microcytosis 04/11/2015    Past Surgical History:  Procedure Laterality Date   COLONOSCOPY     KNEE SURGERY Right    at age 9; clean out   PELVIC LYMPH NODE DISSECTION Bilateral 04/26/2018   Procedure: PELVIC LYMPH NODE DISSECTION;  Surgeon: [name], MD;  Location: [facility] ORS;  Service: Urology;  Laterality: Bilateral;   ROBOT ASSISTED LAPAROSCOPIC RADICAL PROSTATECTOMY N/A 04/26/2018   Procedure: ROBOTIC ASSISTED  LAPAROSCOPIC RADICAL PROSTATECTOMY;  Surgeon: [name], MD;  Location: [facility] OR[facility];  Service: Urology;  Laterality: N/A;    Family History  Problem Relation Age of Onset   Hypertension Mother    CAD Mother    Lung cancer Mother    Benign prostatic hyperplasia Maternal Uncle    Prostate cancer Maternal Uncle    Hypertension Sister    Dementia Maternal Grandfather    Cancer Paternal Grandmother        Think stomach or intestinal   Kidney disease Neg Hx    Bladder Cancer Neg Hx    Kidney cancer Neg Hx     Social History   Tobacco Use   Smoking status: Former    Years: 2.00    Types: Cigarettes    Start date: 07/28/1980    Quit date: 07/28/1982    Years since quitting: 39.6   Smokeless tobacco: Former    Types: Chew    Quit date: 07/29/1987  Substance Use Topics   Alcohol use: Yes    Alcohol/week: 0.0 standard drinks of alcohol    Comment: rarely     Current Outpatient Medications:    Multiple Vitamins-Minerals (MENS 50+ MULTI VITAMIN/MIN PO), Take 1 tablet by mouth daily., Disp: , Rfl:    NONFORMULARY OR COMPOUNDED ITEM, Trimix (30/2/20)-(Pap/Phent/PGE)  Dosage: Inject 0.7 cc per injection  Vial 87m  Qty #10 [place] [phone] Fax [phone], Disp: 10 each, Rfl: 6   pravastatin (PRAVACHOL) 40 MG tablet, TAKE 1 TABLET DAILY, Disp: 90 tablet, Rfl: 1   valsartan-hydrochlorothiazide (DIOVAN-HCT) 80-12.5 MG tablet, Take 1 tablet by mouth daily. In place of Avalide HCTZ, Disp: 30 tablet, Rfl: 0  No Known Allergies  I personally reviewed active problem list, medication list, allergies, family history, social history, health maintenance with the patient/caregiver today.   ROS  Constitutional: Negative for fever , positive for weight change.  Respiratory: Negative for cough and shortness of breath.   Cardiovascular: Negative for chest pain or palpitations.  Gastrointestinal: Negative for abdominal pain, no bowel changes.  Musculoskeletal: Negative  for gait problem or joint swelling.  Skin: Negative for rash.  Neurological: Negative for dizziness or headache.  No other specific complaints in a complete review of systems (except as listed in HPI above).   Objective  Vitals:   03/12/22 0829  BP: 130/76  Pulse: 87  Resp: 16  SpO2: 92%  Weight: 292 lb (132.5 kg)  Height: '5\' 11"'$  (1.803 m)    Body mass index is 40.73 kg/m.  Physical Exam  Constitutional: Patient appears well-developed and well-nourished. Obese  No distress.  HEENT: head atraumatic, normocephalic, pupils equal and reactive to light, neck supple Cardiovascular: Normal rate, regular rhythm and normal heart sounds.  No murmur heard. No BLE edema. Pulmonary/Chest: Effort normal and breath sounds normal. No respiratory distress. Abdominal: Soft.  There is no tenderness. Psychiatric: Patient has a normal mood and affect. behavior is normal. Judgment and thought content normal.   PHQ2/9:    03/12/2022    8:29 AM 09/12/2021    8:43 AM 03/11/2021    8:10 AM 09/10/2020    8:01 AM 03/14/2020    8:25 AM  Depression screen PHQ 2/9  Decreased Interest 0 0 0 0 0  Down, Depressed, Hopeless 0 0 0 0 0  PHQ - 2 Score 0 0 0 0 0  Altered sleeping 0 0  0 0  Tired, decreased energy 0 0  0 0  Change in appetite 0 0  0 0  Feeling bad or failure about yourself  0 0  0 0  Trouble concentrating 0 0  0 0  Moving slowly or fidgety/restless 0 0  0 0  Suicidal thoughts 0 0  0 0  PHQ-9 Score 0 0  0 0    phq 9 is negative   Fall Risk:    03/12/2022    8:28 AM 09/12/2021    8:43 AM 03/11/2021    8:10 AM 09/10/2020    8:01 AM 03/14/2020    8:25 AM  Fall Risk   Falls in the past year? 0 0 0 0 0  Number falls in past yr: 0 0 0 0 0  Injury with Fall? 0 0 0 0 0  Risk for fall due to : No Fall Risks No Fall Risks     Follow up Falls prevention discussed Falls prevention discussed         Functional Status Survey: Is the patient deaf or have difficulty hearing?: No Does the  patient have difficulty seeing, even when wearing glasses/contacts?: No Does the patient have difficulty concentrating, remembering, or making decisions?: No Does the patient have difficulty walking or climbing stairs?: No Does the patient have difficulty dressing or bathing?: No Does the patient have difficulty doing errands alone such as visiting a doctor's office or shopping?: No  Assessment & Plan  1. Morbid obesity ([place])  - Semaglutide-Weight Management (WEGOVY) 0.25 MG/0.5ML SOAJ; Inject 0.25 mg into the skin once a week.  Dispense: 2 mL; Refill: 0  2. Benign hypertension  - valsartan-hydrochlorothiazide (DIOVAN-HCT) 80-12.5 MG tablet; Take 1 tablet by mouth daily.  Dispense: 90 tablet; Refill: 1  3. Dyslipidemia  - pravastatin (PRAVACHOL) 40 MG tablet; Take 1 tablet (40 mg total) by mouth daily.  Dispense: 90 tablet; Refill: 1  4. Metabolic syndrome  Discussed life style modification   5. History of prostate cancer

## 2022-03-12 ENCOUNTER — Ambulatory Visit (INDEPENDENT_AMBULATORY_CARE_PROVIDER_SITE_OTHER): Payer: BC Managed Care – PPO | Admitting: Family Medicine

## 2022-03-12 ENCOUNTER — Encounter: Payer: Self-pay | Admitting: Family Medicine

## 2022-03-12 DIAGNOSIS — E8881 Metabolic syndrome: Secondary | ICD-10-CM

## 2022-03-12 DIAGNOSIS — E785 Hyperlipidemia, unspecified: Secondary | ICD-10-CM

## 2022-03-12 DIAGNOSIS — I10 Essential (primary) hypertension: Secondary | ICD-10-CM

## 2022-03-12 DIAGNOSIS — Z8546 Personal history of malignant neoplasm of prostate: Secondary | ICD-10-CM

## 2022-03-12 MED ORDER — VALSARTAN-HYDROCHLOROTHIAZIDE 80-12.5 MG PO TABS: 1.0000 | ORAL_TABLET | Freq: Every day | ORAL | 1 refills | Status: DC

## 2022-03-12 MED ORDER — PRAVASTATIN SODIUM 40 MG PO TABS: 40.0000 mg | ORAL_TABLET | Freq: Every day | ORAL | 1 refills | Status: DC

## 2022-03-12 MED ORDER — WEGOVY 0.25 MG/0.5ML ~~LOC~~ SOAJ: 0.2500 mg | SUBCUTANEOUS | 0 refills | Status: DC

## 2022-03-12 NOTE — Patient Instructions (Signed)
Call insurance to find out if they cover weight loss medications such as Saxenda or [facility]

## 2022-04-08 ENCOUNTER — Other Ambulatory Visit: Payer: Self-pay

## 2022-04-08 DIAGNOSIS — E785 Hyperlipidemia, unspecified: Secondary | ICD-10-CM

## 2022-04-08 DIAGNOSIS — I10 Essential (primary) hypertension: Secondary | ICD-10-CM

## 2022-04-08 NOTE — Telephone Encounter (Signed)
Teed up

## 2022-09-12 NOTE — Progress Notes (Unsigned)
Name: Jeffrey Whitaker   MRN: [id number]    DOB: 1960-12-25   Date:09/15/2022       Progress Note  Subjective  Chief Complaint  Follow Up  HPI  Metabolic Syndrome: on diet and exercise, denies polyphagia, polyuria or polydipsia, A1C was up to 6.2 % but last visit down to 6 %, he continues to gain weight, he resumed hiking a couple of weeks ago, he is still seeing personal trainer twice a week    HTN: he is taking medication as prescribed, denies chest pain or palpitation. BP has been well controlled   Morbid Obesity: his weight was down to 245 lbs in the beginning of 2020, his weight is up to 297.4 lbs, BMI is over 40 . Discussed weight loss medication, and possible side effects. He denies personal history of pancreatitis or family history of thyroid cancer and we had sent Wegovy to pharmacy last year but due to [place] shortage he never filled medication    History of prostate cancer: s/p surgery done 03/2018, under the care of Dr. Erlene Quan, he is doing well, he finished  pelvic floor PT but is still doing the exercises at home.  Last PSA was normal , staying below 0.1. He is on Trimix for ED . Unchanged    Dyslipidemia: he is taking pravastatin, no myalgias, last LDL improved down from 112 to 82 , we will recheck labs to    Patient Active Problem List   Diagnosis Date Noted   Morbid obesity with BMI of 40.0-44.9, adult ([place]) 09/10/2020   History of prostate cancer 08/23/2019   Allergic rhinitis, seasonal 04/15/2015   Benign hypertension 04/11/2015   Dyslipidemia 04/11/2015   Right medial knee pain 99991111   Metabolic syndrome 99991111   Left shoulder pain 99991111   Umbilical hernia 99991111   Microcytosis 04/11/2015    Past Surgical History:  Procedure Laterality Date   COLONOSCOPY     KNEE SURGERY Right    at age 62; clean out   PELVIC LYMPH NODE DISSECTION Bilateral 04/26/2018   Procedure: PELVIC LYMPH NODE DISSECTION;  Surgeon: [name], MD;  Location:  [facility] ORS;  Service: Urology;  Laterality: Bilateral;   ROBOT ASSISTED LAPAROSCOPIC RADICAL PROSTATECTOMY N/A 04/26/2018   Procedure: ROBOTIC ASSISTED LAPAROSCOPIC RADICAL PROSTATECTOMY;  Surgeon: [name], MD;  Location: [facility] ORS;  Service: Urology;  Laterality: N/A;    Family History  Problem Relation Age of Onset   Hypertension Mother    CAD Mother    Lung cancer Mother    Benign prostatic hyperplasia Maternal Uncle    Prostate cancer Maternal Uncle    Hypertension Sister    Dementia Maternal Grandfather    Cancer Paternal Grandmother        Think stomach or intestinal   Kidney disease Neg Hx    Bladder Cancer Neg Hx    Kidney cancer Neg Hx     Social History   Tobacco Use   Smoking status: Former    Years: 2.00    Types: Cigarettes    Start date: 07/28/1980    Quit date: 07/28/1982    Years since quitting: 40.1   Smokeless tobacco: Former    Types: Chew    Quit date: 07/29/1987  Substance Use Topics   Alcohol use: Yes    Alcohol/week: 0.0 standard drinks of alcohol    Comment: rarely     Current Outpatient Medications:    Multiple Vitamins-Minerals (MENS 50+ MULTI VITAMIN/MIN PO), Take 1 tablet by mouth  daily., Disp: , Rfl:    NONFORMULARY OR COMPOUNDED ITEM, Trimix (30/2/20)-(Pap/Phent/PGE)  Dosage: Inject 0.7 cc per injection  Vial 61m  Qty #10 Refills 6  [phone] [phone] Disp: 10 each, Rfl: 6   pravastatin (PRAVACHOL) 40 MG tablet, Take 1 tablet (40 mg total) by mouth daily., Disp: 90 tablet, Rfl: 1   valsartan-hydrochlorothiazide (DIOVAN-HCT) 80-12.5 MG tablet, Take 1 tablet by mouth daily., Disp: 90 tablet, Rfl: 1  No Known Allergies  I personally reviewed active problem list, medication list, allergies, family history, social history, health maintenance with the patient/caregiver today.   ROS  Constitutional: Negative for fever , positive for  mild weight change.  Respiratory: Negative for cough and shortness of breath.    Cardiovascular: Negative for chest pain or palpitations.  Gastrointestinal: Negative for abdominal pain, no bowel changes.  Musculoskeletal: Negative for gait problem or joint swelling.  Skin: Negative for rash.  Neurological: Negative for dizziness or headache.  No other specific complaints in a complete review of systems (except as listed in HPI above).   Objective  Vitals:   09/15/22 0810  BP: 126/72  Pulse: 86  Resp: 18  Temp: 98.4 F (36.9 C)  TempSrc: Oral  SpO2: 97%  Weight: 297 lb 6.4 oz (134.9 kg)  Height: 5' 11"$  (1.803 m)    Body mass index is 41.48 kg/m.  Physical Exam  Constitutional: Patient appears well-developed and well-nourished. Obese  No distress.  HEENT: head atraumatic, normocephalic, pupils equal and reactive to light, neck supple Cardiovascular: Normal rate, regular rhythm and normal heart sounds.  No murmur heard. No BLE edema. Pulmonary/Chest: Effort normal and breath sounds normal. No respiratory distress. Abdominal: Soft.  There is no tenderness. Psychiatric: Patient has a normal mood and affect. behavior is normal. Judgment and thought content normal.   PHQ2/9:    09/15/2022    8:13 AM 03/12/2022    8:29 AM 09/12/2021    8:43 AM 03/11/2021    8:10 AM 09/10/2020    8:01 AM  Depression screen PHQ 2/9  Decreased Interest 0 0 0 0 0  Down, Depressed, Hopeless 0 0 0 0 0  PHQ - 2 Score 0 0 0 0 0  Altered sleeping 0 0 0  0  Tired, decreased energy 0 0 0  0  Change in appetite 0 0 0  0  Feeling bad or failure about yourself  0 0 0  0  Trouble concentrating 0 0 0  0  Moving slowly or fidgety/restless 0 0 0  0  Suicidal thoughts 0 0 0  0  PHQ-9 Score 0 0 0  0    phq 9 is negative   Fall Risk:    09/15/2022    8:12 AM 03/12/2022    8:28 AM 09/12/2021    8:43 AM 03/11/2021    8:10 AM 09/10/2020    8:01 AM  Fall Risk   Falls in the past year? 0 0 0 0 0  Number falls in past yr:  0 0 0 0  Injury with Fall?  0 0 0 0  Risk for fall due to : No  Fall Risks No Fall Risks No Fall Risks    Follow up Falls prevention discussed Falls prevention discussed Falls prevention discussed        Functional Status Survey: Is the patient deaf or have difficulty hearing?: No Does the patient have difficulty seeing, even when wearing glasses/contacts?: No Does the patient have difficulty concentrating,  remembering, or making decisions?: No Does the patient have difficulty walking or climbing stairs?: No Does the patient have difficulty dressing or bathing?: No Does the patient have difficulty doing errands alone such as visiting a doctor's office or shopping?: No    Assessment & Plan  1. Morbid obesity ([place])  Discussed all optiosns for weight loss medications including Wegovy, Zepbound,  Qsymia and Contrave. Discussed risk and benefits of each of them.   2. Metabolic syndrome  - Hemoglobin A1c  3. Benign hypertension  - CBC with Differential/Platelet - COMPLETE METABOLIC PANEL WITH GFR - valsartan-hydrochlorothiazide (DIOVAN-HCT) 80-12.5 MG tablet; Take 1 tablet by mouth daily.  Dispense: 90 tablet; Refill: 1  4. History of prostate cancer  Seeing Urologist   5. Dyslipidemia  - Lipid panel - pravastatin (PRAVACHOL) 40 MG tablet; Take 1 tablet (40 mg total) by mouth daily.  Dispense: 90 tablet; Refill: 1  6. Colon cancer screening  - Ambulatory referral to Gastroenterology  7. ED (erectile dysfunction) of organic origin   Seeing Urologist, using Trimix

## 2022-09-15 ENCOUNTER — Other Ambulatory Visit: Payer: Self-pay

## 2022-09-15 ENCOUNTER — Encounter: Payer: Self-pay | Admitting: Family Medicine

## 2022-09-15 ENCOUNTER — Ambulatory Visit (INDEPENDENT_AMBULATORY_CARE_PROVIDER_SITE_OTHER): Payer: BC Managed Care – PPO | Admitting: Family Medicine

## 2022-09-15 ENCOUNTER — Telehealth: Payer: Self-pay

## 2022-09-15 DIAGNOSIS — Z1211 Encounter for screening for malignant neoplasm of colon: Secondary | ICD-10-CM

## 2022-09-15 DIAGNOSIS — Z8546 Personal history of malignant neoplasm of prostate: Secondary | ICD-10-CM | POA: Diagnosis not present

## 2022-09-15 DIAGNOSIS — E8881 Metabolic syndrome: Secondary | ICD-10-CM

## 2022-09-15 DIAGNOSIS — E785 Hyperlipidemia, unspecified: Secondary | ICD-10-CM | POA: Diagnosis not present

## 2022-09-15 DIAGNOSIS — N529 Male erectile dysfunction, unspecified: Secondary | ICD-10-CM

## 2022-09-15 DIAGNOSIS — I10 Essential (primary) hypertension: Secondary | ICD-10-CM

## 2022-09-15 MED ORDER — VALSARTAN-HYDROCHLOROTHIAZIDE 80-12.5 MG PO TABS: 1.0000 | ORAL_TABLET | Freq: Every day | ORAL | 1 refills | Status: DC

## 2022-09-15 MED ORDER — GOLYTELY 236 G PO SOLR: 4000.0000 mL | Freq: Once | ORAL | 0 refills | Status: AC

## 2022-09-15 MED ORDER — PRAVASTATIN SODIUM 40 MG PO TABS: 40.0000 mg | ORAL_TABLET | Freq: Every day | ORAL | 1 refills | Status: DC

## 2022-09-15 NOTE — Patient Instructions (Signed)
Research cost and coverage for weight for loss medications.  Zepbound, [place], Contrave or Qsymia, the last two are oral medications but can raise your blood pressure

## 2022-09-15 NOTE — Telephone Encounter (Signed)
Gastroenterology Pre-Procedure Review  Request Date: 10/21/22 Requesting Physician: Dr. Marius Ditch  PATIENT REVIEW QUESTIONS: The patient responded to the following health history questions as indicated:    1. Are you having any GI issues? no 2. Do you have a personal history of Polyps? no 3. Do you have a family history of Colon Cancer or Polyps? no 4. Diabetes Mellitus? no 5. Joint replacements in the past 12 months?no 6. Major health problems in the past 3 months?no 7. Any artificial heart valves, MVP, or defibrillator?no    MEDICATIONS & ALLERGIES:    Patient reports the following regarding taking any anticoagulation/antiplatelet therapy:   Plavix, Coumadin, Eliquis, Xarelto, Lovenox, Pradaxa, Brilinta, or Effient? no Aspirin? no  Patient confirms/reports the following medications:  Current Outpatient Medications  Medication Sig Dispense Refill   Multiple Vitamins-Minerals (MENS 50+ MULTI VITAMIN/MIN PO) Take 1 tablet by mouth daily.     NONFORMULARY OR COMPOUNDED ITEM Trimix (30/2/20)-(Pap/Phent/PGE)  Dosage: Inject 0.7 cc per injection  Vial 80m  Qty #10 Refills 6  [phone] [phone] each 6   pravastatin (PRAVACHOL) 40 MG tablet Take 1 tablet (40 mg total) by mouth daily. 90 tablet 1   valsartan-hydrochlorothiazide (DIOVAN-HCT) 80-12.5 MG tablet Take 1 tablet by mouth daily. 90 tablet 1   No current facility-administered medications for this visit.    Patient confirms/reports the following allergies:  No Known Allergies  No orders of the defined types were placed in this encounter.   AUTHORIZATION INFORMATION Primary Insurance: 1D#: Group #:  Secondary Insurance: 1D#: Group #:  SCHEDULE INFORMATION: Date:  Time: Location:

## 2022-09-16 LAB — LIPID PANEL
Cholesterol: 156 mg/dL (ref <200)
HDL: 49 mg/dL (ref >=40)
LDL Cholesterol (Calc): 87 mg/dL (calc)
Non-HDL Cholesterol (Calc): 107 mg/dL (calc) (ref <130)
Total CHOL/HDL Ratio: 3.2 (calc) (ref <5.0)
Triglycerides: 105 mg/dL (ref <150)

## 2022-09-16 LAB — CBC WITH DIFFERENTIAL/PLATELET
Absolute Monocytes: 509 cells/uL (ref 200–950)
Basophils Absolute: 47 cells/uL (ref 0–200)
Basophils Relative: 0.7 %
Eosinophils Absolute: 168 cells/uL (ref 15–500)
Eosinophils Relative: 2.5 %
HCT: 45 % (ref 38.5–50.0)
Hemoglobin: 14.2 g/dL (ref 13.2–17.1)
Lymphs Abs: 1333 cells/uL (ref 850–3900)
MCH: 23.6 pg — ABNORMAL LOW (ref 27.0–33.0)
MCHC: 31.6 g/dL — ABNORMAL LOW (ref 32.0–36.0)
MCV: 74.8 fL — ABNORMAL LOW (ref 80.0–100.0)
MPV: 11 fL (ref 7.5–12.5)
Monocytes Relative: 7.6 %
Neutro Abs: 4643 cells/uL (ref 1500–7800)
Neutrophils Relative %: 69.3 %
Platelets: 210 10*3/uL (ref 140–400)
RBC: 6.02 10*6/uL — ABNORMAL HIGH (ref 4.20–5.80)
RDW: 13.8 % (ref 11.0–15.0)
Total Lymphocyte: 19.9 %
WBC: 6.7 10*3/uL (ref 3.8–10.8)

## 2022-09-16 LAB — HEMOGLOBIN A1C
Hgb A1c MFr Bld: 6.5 % of total Hgb — ABNORMAL HIGH (ref <5.7)
Mean Plasma Glucose: 140 mg/dL
eAG (mmol/L): 7.7 mmol/L

## 2022-09-16 LAB — COMPLETE METABOLIC PANEL WITH GFR
AG Ratio: 1.5 (calc) (ref 1.0–2.5)
ALT: 23 U/L (ref 9–46)
AST: 16 U/L (ref 10–35)
Albumin: 4.3 g/dL (ref 3.6–5.1)
Alkaline phosphatase (APISO): 51 U/L (ref 35–144)
BUN: 17 mg/dL (ref 7–25)
CO2: 28 mmol/L (ref 20–32)
Calcium: 9.6 mg/dL (ref 8.6–10.3)
Chloride: 106 mmol/L (ref 98–110)
Creat: 1.21 mg/dL (ref 0.70–1.35)
Globulin: 2.9 g/dL (calc) (ref 1.9–3.7)
Glucose, Bld: 98 mg/dL (ref 65–99)
Potassium: 4.5 mmol/L (ref 3.5–5.3)
Sodium: 142 mmol/L (ref 135–146)
Total Bilirubin: 0.5 mg/dL (ref 0.2–1.2)
Total Protein: 7.2 g/dL (ref 6.1–8.1)
eGFR: 68 mL/min/{1.73_m2} (ref >=60)

## 2022-10-10 ENCOUNTER — Encounter: Payer: Self-pay | Admitting: Gastroenterology

## 2022-10-21 ENCOUNTER — Encounter
Admission: RE | Disposition: A | Discharge status: Home / Self Care | Payer: Self-pay | Source: Home / Self Care | Attending: Gastroenterology

## 2022-10-21 ENCOUNTER — Ambulatory Visit: Payer: BC Managed Care – PPO | Admitting: Anesthesiology

## 2022-10-21 ENCOUNTER — Ambulatory Visit
Admission: RE | Admit: 2022-10-21 | Discharge: 2022-10-21 | Disposition: A | Discharge status: Home / Self Care | Payer: BC Managed Care – PPO | Attending: Gastroenterology | Admitting: Gastroenterology

## 2022-10-21 ENCOUNTER — Encounter: Payer: Self-pay | Admitting: Gastroenterology

## 2022-10-21 ENCOUNTER — Other Ambulatory Visit: Payer: Self-pay

## 2022-10-21 DIAGNOSIS — I10 Essential (primary) hypertension: Secondary | ICD-10-CM | POA: Insufficient documentation

## 2022-10-21 DIAGNOSIS — Z6841 Body Mass Index (BMI) 40.0 and over, adult: Secondary | ICD-10-CM | POA: Diagnosis not present

## 2022-10-21 DIAGNOSIS — Z1211 Encounter for screening for malignant neoplasm of colon: Secondary | ICD-10-CM

## 2022-10-21 DIAGNOSIS — Z09 Encounter for follow-up examination after completed treatment for conditions other than malignant neoplasm: Secondary | ICD-10-CM | POA: Diagnosis not present

## 2022-10-21 DIAGNOSIS — Z87891 Personal history of nicotine dependence: Secondary | ICD-10-CM | POA: Diagnosis not present

## 2022-10-21 HISTORY — PX: COLONOSCOPY WITH PROPOFOL: SHX5780

## 2022-10-21 SURGERY — COLONOSCOPY WITH PROPOFOL
Anesthesia: General | Site: Rectum

## 2022-10-21 MED ORDER — ONDANSETRON HCL 4 MG/2ML IJ SOLN
INTRAMUSCULAR | Status: DC | PRN
  Administered 2022-10-21: 4 mg via INTRAVENOUS

## 2022-10-21 MED ORDER — STERILE WATER FOR IRRIGATION IR SOLN
Status: DC | PRN
  Administered 2022-10-21: 1

## 2022-10-21 MED ORDER — STERILE WATER FOR IRRIGATION IR SOLN
Status: DC | PRN
  Administered 2022-10-21: 120 mL

## 2022-10-21 MED ORDER — LIDOCAINE HCL (CARDIAC) PF 100 MG/5ML IV SOSY
PREFILLED_SYRINGE | INTRAVENOUS | Status: DC | PRN
  Administered 2022-10-21: 40 mg via INTRAVENOUS

## 2022-10-21 MED ORDER — LACTATED RINGERS IV SOLN: INTRAVENOUS | Status: DC

## 2022-10-21 MED ORDER — SODIUM CHLORIDE 0.9 % IV SOLN: INTRAVENOUS | Status: DC

## 2022-10-21 MED ORDER — GLYCOPYRROLATE 0.2 MG/ML IJ SOLN
INTRAMUSCULAR | Status: DC | PRN
  Administered 2022-10-21: .2 mg via INTRAVENOUS

## 2022-10-21 MED ORDER — PROPOFOL 10 MG/ML IV BOLUS
INTRAVENOUS | Status: DC | PRN
  Administered 2022-10-21: 20 mg via INTRAVENOUS
  Administered 2022-10-21 (×2): 50 mg via INTRAVENOUS
  Administered 2022-10-21: 180 ug/kg/min via INTRAVENOUS
  Administered 2022-10-21: 30 mg via INTRAVENOUS

## 2022-10-21 SURGICAL SUPPLY — 25 items

## 2022-10-21 NOTE — Anesthesia Preprocedure Evaluation (Signed)
Anesthesia Evaluation  Patient identified by MRN, date of birth, ID band Patient awake    Reviewed: Allergy & Precautions, NPO status , Patient's Chart, lab work & pertinent test results  History of Anesthesia Complications Negative for: history of anesthetic complications  Airway Mallampati: II  TM Distance: >3 FB Neck ROM: Full    Dental no notable dental hx. (+) Teeth Intact   Pulmonary neg pulmonary ROS, neg sleep apnea, neg COPD, Patient abstained from smoking.Not current smoker, former smoker   Pulmonary exam normal breath sounds clear to auscultation       Cardiovascular Exercise Tolerance: Good METShypertension, Pt. on medications (-) CAD and (-) Past MI (-) dysrhythmias  Rhythm:Regular Rate:Normal - Systolic murmurs    Neuro/Psych negative neurological ROS  negative psych ROS   GI/Hepatic ,neg GERD  ,,(+)     (-) substance abuse    Endo/Other  neg diabetes  Morbid obesity  Renal/GU negative Renal ROS     Musculoskeletal   Abdominal  (+) + obese  Peds  Hematology   Anesthesia Other Findings Past Medical History: No date: Elevated PSA No date: Hypertension 2019: Prostate cancer ([place]) [id number]: Umbilical hernia  Reproductive/Obstetrics                             Anesthesia Physical Anesthesia Plan  ASA: 3  Anesthesia Plan: General   Post-op Pain Management: Minimal or no pain anticipated   Induction: Intravenous  PONV Risk Score and Plan: 2 and Propofol infusion, TIVA and Ondansetron  Airway Management Planned: Nasal Cannula  Additional Equipment: None  Intra-op Plan:   Post-operative Plan:   Informed Consent: I have reviewed the patients History and Physical, chart, labs and discussed the procedure including the risks, benefits and alternatives for the proposed anesthesia with the patient or authorized representative who has indicated his/her understanding and  acceptance.     Dental advisory given  Plan Discussed with: CRNA and Surgeon  Anesthesia Plan Comments: (Discussed risks of anesthesia with patient, including possibility of difficulty with spontaneous ventilation under anesthesia necessitating airway intervention, PONV, and rare risks such as cardiac or respiratory or neurological events, and allergic reactions. Discussed the role of CRNA in patient's perioperative care. Patient understands. Patient informed about increased incidence of above perioperative risk due to high BMI. Patient understands.  )       Anesthesia Quick Evaluation

## 2022-10-21 NOTE — H&P (Signed)
Cephas Darby, MD [address]  [place]  [place], [place] [address]  Main: [phone]  Fax: [phone] Pager: [phone]  Primary Care Physician:  Steele Sizer, MD Primary Gastroenterologist:  Dr. Cephas Darby  Pre-Procedure History & Physical: HPI:  Jeffrey Whitaker is a 62 y.o. male is here for an colonoscopy.   Past Medical History:  Diagnosis Date   Elevated PSA    Hypertension    Prostate cancer ([place]) [id number]   Umbilical hernia [id number]    Past Surgical History:  Procedure Laterality Date   COLONOSCOPY     KNEE SURGERY Right    at age 53; clean out   PELVIC LYMPH NODE DISSECTION Bilateral 04/26/2018   Procedure: PELVIC LYMPH NODE DISSECTION;  Surgeon: [name], MD;  Location: [facility] OR[facility];  Service: Urology;  Laterality: Bilateral;   ROBOT ASSISTED LAPAROSCOPIC RADICAL PROSTATECTOMY N/A 04/26/2018   Procedure: ROBOTIC ASSISTED LAPAROSCOPIC RADICAL PROSTATECTOMY;  Surgeon: [name], MD;  Location: [facility];  Service: Urology;  Laterality: N/A;    Prior to Admission medications   Medication Sig Start Date End Date Taking? Authorizing Provider  Multiple Vitamins-Minerals (MENS 50+ MULTI VITAMIN/MIN PO) Take 1 tablet by mouth daily.   Yes [provider]  NONFORMULARY OR COMPOUNDED ITEM Trimix (30/2/20)-(Pap/Phent/PGE)  Dosage: Inject 0.7 cc per injection  Vial 9ml  Qty #10 Refills [place] [phone] Fax [phone] 05/16/21  Yes [name], MD  pravastatin (PRAVACHOL) 40 MG tablet Take 1 tablet (40 mg total) by mouth daily. 09/15/22  Yes Sowles, Drue Stager, MD  valsartan-hydrochlorothiazide (DIOVAN-HCT) 80-12.5 MG tablet Take 1 tablet by mouth daily. 09/15/22  Yes Steele Sizer, MD    Allergies as of 09/15/2022   (No Known Allergies)    Family History  Problem Relation Age of Onset   Hypertension Mother    CAD Mother    Lung cancer Mother    Benign prostatic hyperplasia Maternal Uncle    Prostate cancer  Maternal Uncle    Hypertension Sister    Dementia Maternal Grandfather    Cancer Paternal Grandmother        Think stomach or intestinal   Kidney disease Neg Hx    Bladder Cancer Neg Hx    Kidney cancer Neg Hx     Social History   Socioeconomic History   Marital status: Married    Spouse name: Santiago Glad   Number of children: 2   Years of education: Not on file   Highest education level: Associate degree: occupational, Hotel manager, or vocational program  Occupational History   Not on file  Tobacco Use   Smoking status: Former    Years: 2    Types: Cigarettes    Start date: 07/28/1980    Quit date: 07/28/1982    Years since quitting: 40.2   Smokeless tobacco: Former    Types: Chew    Quit date: 07/29/1987  Vaping Use   Vaping Use: Never used  Substance and Sexual Activity   Alcohol use: Yes    Alcohol/week: 0.0 standard drinks of alcohol    Comment: rarely   Drug use: No   Sexual activity: Yes    Partners: Female  Other Topics Concern   Not on file  Social History Narrative   Patient has 2 step-children   Social Determinants of Health   Financial Resource Strain: Low Risk  (09/12/2021)   Overall Financial Resource Strain (CARDIA)    Difficulty of Paying Living Expenses: Not hard at all  Food Insecurity: No Food Insecurity (09/12/2021)   Hunger Vital Sign    Worried About Running Out of Food in the Last Year: Never true    Ran Out of Food in the Last Year: Never true  Transportation Needs: No Transportation Needs (09/12/2021)   PRAPARE - Hydrologist (Medical): No    Lack of Transportation (Non-Medical): No  Physical Activity: Insufficiently Active (09/12/2021)   Exercise Vital Sign    Days of Exercise per Week: 3 days    Minutes of Exercise per Session: 40 min  Stress: No Stress Concern Present (09/12/2021)   [place]    Feeling of Stress : Not at all  Social Connections:  Moderately Integrated (09/12/2021)   Social Connection and Isolation Panel [NHANES]    Frequency of Communication with Friends and Family: More than three times a week    Frequency of Social Gatherings with Friends and Family: Three times a week    Attends Religious Services: Never    Active Member of Clubs or Organizations: Yes    Attends Archivist Meetings: More than 4 times per year    Marital Status: Married  Human resources officer Violence: Not At Risk (09/12/2021)   Humiliation, Afraid, Rape, and Kick questionnaire    Fear of Current or Ex-Partner: No    Emotionally Abused: No    Physically Abused: No    Sexually Abused: No    Review of Systems: See HPI, otherwise negative ROS  Physical Exam: BP (!) 141/93   Temp (!) 97.2 F (36.2 C) (Tympanic)   Ht 5' 10.98" (1.803 m)   Wt 133.4 kg   SpO2 96%   BMI 41.02 kg/m  General:   Alert,  pleasant and cooperative in NAD Head:  Normocephalic and atraumatic. Neck:  Supple; no masses or thyromegaly. Lungs:  Clear throughout to auscultation.    Heart:  Regular rate and rhythm. Abdomen:  Soft, nontender and nondistended. Normal bowel sounds, without guarding, and without rebound.   Neurologic:  Alert and  oriented x4;  grossly normal neurologically.  Impression/Plan: Jeffrey Whitaker is here for an colonoscopy to be performed for colon cancer screening  Risks, benefits, limitations, and alternatives regarding  colonoscopy have been reviewed with the patient.  Questions have been answered.  All parties agreeable.   Sherri Sear, MD  10/21/2022, 8:34 AM

## 2022-10-21 NOTE — Op Note (Signed)
[facility] Gastroenterology Patient Name: Jeffrey Whitaker Procedure Date: 10/21/2022 9:07 AM MRN: [id number] Account #: [id number] Date of Birth: 03/28/1961 Admit Type: Outpatient Age: 62 Room: [facility] OR ROOM 01 Gender: Male Note Status: Finalized Instrument Name: [id number] Procedure:             Colonoscopy Indications:           Screening for colorectal malignant neoplasm, Screening                         for colorectal malignant neoplasm (last colonoscopy                         was 10 years ago) Providers:             Lin Landsman MD, MD Referring MD:          Bethena Roys. Sowles, MD (Referring MD) Medicines:             General Anesthesia Complications:         No immediate complications. Estimated blood loss: None. Procedure:             Pre-Anesthesia Assessment:                        - Prior to the procedure, a History and Physical was                         performed, and patient medications and allergies were                         reviewed. The patient is competent. The risks and                         benefits of the procedure and the sedation options and                         risks were discussed with the patient. All questions                         were answered and informed consent was obtained.                         Patient identification and proposed procedure were                         verified by the physician, the nurse, the                         anesthesiologist, the anesthetist and the technician                         in the pre-procedure area in the procedure room in the                         endoscopy suite. Mental Status Examination: alert and                         oriented. Airway Examination: normal oropharyngeal  airway and neck mobility. Respiratory Examination:                         clear to auscultation. CV Examination: normal.                         Prophylactic Antibiotics: The patient  does not require                         prophylactic antibiotics. Prior Anticoagulants: The                         patient has taken no anticoagulant or antiplatelet                         agents. ASA Grade Assessment: III - A patient with                         severe systemic disease. After reviewing the risks and                         benefits, the patient was deemed in satisfactory                         condition to undergo the procedure. The anesthesia                         plan was to use general anesthesia. Immediately prior                         to administration of medications, the patient was                         re-assessed for adequacy to receive sedatives. The                         heart rate, respiratory rate, oxygen saturations,                         blood pressure, adequacy of pulmonary ventilation, and                         response to care were monitored throughout the                         procedure. The physical status of the patient was                         re-assessed after the procedure.                        After obtaining informed consent, the colonoscope was                         passed under direct vision. Throughout the procedure,                         the patient's blood pressure, pulse, and oxygen  saturations were monitored continuously. The was                         introduced through the anus and advanced to the the                         cecum, identified by appendiceal orifice and ileocecal                         valve. The colonoscopy was performed without                         difficulty. The patient tolerated the procedure well.                         The quality of the bowel preparation was evaluated                         using the BBPS [facility] Bowel Preparation Scale) with                         scores of: Right Colon = 3, Transverse Colon = 3 and                         Left Colon = 3  (entire mucosa seen well with no                         residual staining, small fragments of stool or opaque                         liquid). The total BBPS score equals 9. The ileocecal                         valve, appendiceal orifice, and rectum were                         photographed. Findings:      The perianal and digital rectal examinations were normal. Pertinent       negatives include normal sphincter tone and no palpable rectal lesions.      The colon (entire examined portion) appeared normal.      The retroflexed view of the distal rectum and anal verge was normal and       showed no anal or rectal abnormalities. Impression:            - The entire examined colon is normal.                        - The distal rectum and anal verge are normal on                         retroflexion view.                        - No specimens collected. Recommendation:        - Discharge patient to home (with escort).                        -  Resume previous diet today.                        - Continue present medications.                        - Repeat colonoscopy in 10 years for screening                         purposes. Procedure Code(s):     --- Professional ---                        [id number], Colorectal cancer screening; colonoscopy on                         individual not meeting criteria for high risk Diagnosis Code(s):     --- Professional ---                        Z12.11, Encounter for screening for malignant neoplasm                         of colon CPT copyright 2022 American Medical Association. All rights reserved. The codes documented in this report are preliminary and upon coder review may  be revised to meet current compliance requirements. Dr. Ulyess Mort Lin Landsman MD, MD 10/21/2022 9:42:57 AM This report has been signed electronically. Number of Addenda: 0 Note Initiated On: 10/21/2022 9:07 AM Scope Withdrawal Time: 0 hours 15 minutes 39 seconds  Total  Procedure Duration: 0 hours 19 minutes 26 seconds  Estimated Blood Loss:  Estimated blood loss: none.      [facility]

## 2022-10-21 NOTE — Transfer of Care (Signed)
Immediate Anesthesia Transfer of Care Note  Patient: Jeffrey Whitaker  Procedure(s) Performed: COLONOSCOPY WITH PROPOFOL (Rectum)  Patient Location: PACU  Anesthesia Type: General  Level of Consciousness: awake, alert  and patient cooperative  Airway and Oxygen Therapy: Patient Spontanous Breathing and Patient connected to supplemental oxygen  Post-op Assessment: Post-op Vital signs reviewed, Patient's Cardiovascular Status Stable, Respiratory Function Stable, Patent Airway and No signs of Nausea or vomiting  Post-op Vital Signs: Reviewed and stable  Complications: No notable events documented.

## 2022-10-21 NOTE — Anesthesia Postprocedure Evaluation (Signed)
Anesthesia Post Note  Patient: Jeffrey Whitaker  Procedure(s) Performed: COLONOSCOPY WITH PROPOFOL (Rectum)  Patient location during evaluation: PACU Anesthesia Type: General Level of consciousness: awake and alert Pain management: pain level controlled Vital Signs Assessment: post-procedure vital signs reviewed and stable Respiratory status: spontaneous breathing, nonlabored ventilation, respiratory function stable and patient connected to nasal cannula oxygen Cardiovascular status: blood pressure returned to baseline and stable Postop Assessment: no apparent nausea or vomiting Anesthetic complications: no   No notable events documented.   Last Vitals:  Vitals:   10/21/22 0950 10/21/22 0953  BP:  135/68  Pulse: (!) 101 99  Resp: 14 17  Temp:  36.8 C  SpO2: 95% 97%    Last Pain:  Vitals:   10/21/22 0953  TempSrc:   PainSc: 0-No pain                 Arita Miss

## 2022-10-22 ENCOUNTER — Encounter: Payer: Self-pay | Admitting: Gastroenterology

## 2022-12-04 ENCOUNTER — Other Ambulatory Visit: Payer: BC Managed Care – PPO

## 2022-12-04 DIAGNOSIS — C61 Malignant neoplasm of prostate: Secondary | ICD-10-CM | POA: Diagnosis not present

## 2022-12-05 LAB — PSA: Prostate Specific Ag, Serum: <0.1 ng/mL (ref 0.0–4.0)

## 2022-12-09 ENCOUNTER — Telehealth (INDEPENDENT_AMBULATORY_CARE_PROVIDER_SITE_OTHER): Payer: BC Managed Care – PPO | Admitting: Urology

## 2022-12-09 DIAGNOSIS — C61 Malignant neoplasm of prostate: Secondary | ICD-10-CM

## 2022-12-10 NOTE — Progress Notes (Signed)
Unable to complete virtual visit, will reschedule for in person  Vanna Scotland, MD

## 2022-12-12 ENCOUNTER — Ambulatory Visit (INDEPENDENT_AMBULATORY_CARE_PROVIDER_SITE_OTHER): Payer: BC Managed Care – PPO | Admitting: Physician Assistant

## 2022-12-12 VITALS — BP 124/87 | HR 71 | Ht 70.0 in | Wt 295.4 lb

## 2022-12-12 DIAGNOSIS — N5231 Erectile dysfunction following radical prostatectomy: Secondary | ICD-10-CM | POA: Diagnosis not present

## 2022-12-12 DIAGNOSIS — C61 Malignant neoplasm of prostate: Secondary | ICD-10-CM

## 2022-12-12 DIAGNOSIS — Z8546 Personal history of malignant neoplasm of prostate: Secondary | ICD-10-CM

## 2022-12-12 MED ORDER — NONFORMULARY OR COMPOUNDED ITEM: 6 refills | Status: DC

## 2022-12-12 NOTE — Progress Notes (Signed)
12/12/2022 10:56 AM   Jeffrey Whitaker 1960-11-16 [id number]  CC: Chief Complaint  Patient presents with   Prostate Cancer    Follow up   HPI: Jeffrey Whitaker is a 62 y.o. male with PMH prostate cancer s/p partial nerve sparing radical prostatectomy in 2019 and secondary ED on Trimix who presents today for annual follow-up.   PSA remains undetectable as of 12/04/2022.  Today he reports no acute concerns or significant health changes over the past year.  His ED continues to be well-managed with Trimix, though he has trouble filling this on a regular basis because he works in [place] and does not get to [place] often.  He wonders if there is another compounding pharmacy he might use instead.  PMH: Past Medical History:  Diagnosis Date   Elevated PSA    Hypertension    Prostate cancer (HCC) 2019   Umbilical hernia 2019    Surgical History: Past Surgical History:  Procedure Laterality Date   COLONOSCOPY     COLONOSCOPY WITH PROPOFOL N/A 10/21/2022   Procedure: COLONOSCOPY WITH PROPOFOL;  Surgeon: Toney Reil, MD;  Location: [facility];  Service: Endoscopy;  Laterality: N/A;   KNEE SURGERY Right    at age 27; clean out   PELVIC LYMPH NODE DISSECTION Bilateral 04/26/2018   Procedure: PELVIC LYMPH NODE DISSECTION;  Surgeon: Vanna Scotland, MD;  Location: [facility];  Service: Urology;  Laterality: Bilateral;   ROBOT ASSISTED LAPAROSCOPIC RADICAL PROSTATECTOMY N/A 04/26/2018   Procedure: ROBOTIC ASSISTED LAPAROSCOPIC RADICAL PROSTATECTOMY;  Surgeon: Vanna Scotland, MD;  Location: [facility] ORS;  Service: Urology;  Laterality: N/A;    Home Medications:  Allergies as of 12/12/2022   No Known Allergies      Medication List        Accurate as of Dec 12, 2022 10:56 AM. If you have any questions, ask your nurse or doctor.          MENS 50+ MULTI VITAMIN/MIN PO Take 1 tablet by mouth daily.   NONFORMULARY OR COMPOUNDED ITEM Trimix  (30/2/20)-(Pap/Phent/PGE)  Dosage: Inject 0.7 cc per injection  Vial 1ml  Qty #10 Refills 6  [facility] [phone] Fax [phone]   pravastatin 40 MG tablet Commonly known as: PRAVACHOL Take 1 tablet (40 mg total) by mouth daily.   valsartan-hydrochlorothiazide 80-12.5 MG tablet Commonly known as: DIOVAN-HCT Take 1 tablet by mouth daily.        Allergies:  No Known Allergies  Family History: Family History  Problem Relation Age of Onset   Hypertension Mother    CAD Mother    Lung cancer Mother    Benign prostatic hyperplasia Maternal Uncle    Prostate cancer Maternal Uncle    Hypertension Sister    Dementia Maternal Grandfather    Cancer Paternal Grandmother        Think stomach or intestinal   Kidney disease Neg Hx    Bladder Cancer Neg Hx    Kidney cancer Neg Hx     Social History:   reports that he quit smoking about 40 years ago. His smoking use included cigarettes. He started smoking about 42 years ago. He quit smokeless tobacco use about 35 years ago.  His smokeless tobacco use included chew. He reports current alcohol use. He reports that he does not use drugs.  Physical Exam: BP 124/87   Pulse 71   Ht 5\' 10"  (1.778 m)   Wt 295 lb 6 oz (134 kg)   BMI 42.38  kg/m   Constitutional:  Alert and oriented, no acute distress, nontoxic appearing HEENT: [place], AT Cardiovascular: No clubbing, cyanosis, or edema Respiratory: Normal respiratory effort, no increased work of breathing Skin: No rashes, bruises or suspicious lesions Neurologic: Grossly intact, no focal deficits, moving all 4 extremities Psychiatric: Normal mood and affect  Laboratory Data: Results for orders placed or performed in visit on 12/04/22  PSA  Result Value Ref Range   Prostate Specific Ag, Serum <0.1 0.0 - 4.0 ng/mL   Assessment & Plan:   1. Prostate cancer (HCC) NED.  Will continue to monitor with annual PSA for now. - PSA; Future  2. Erectile dysfunction after  radical prostatectomy Well-managed on Trimix, will send to [facility] in [place] as an alternative. - NONFORMULARY OR COMPOUNDED ITEM; Trimix (30/2/20)-(Pap/Phent/PGE)  Dosage: Inject 0.7 cc per injection  Vial 1ml  Qty #10 Refills 6  Dispense: 10 each; Refill: 6  Return in about 1 year (around 12/12/2023) for Prostate cancer follow-up with PSA prior with Dr. Apolinar Junes.  Carman Ching, PA-C  [facility] [address] [place], [place] [address] [phone]

## 2022-12-19 ENCOUNTER — Encounter: Payer: BC Managed Care – PPO | Admitting: Family Medicine

## 2023-02-24 NOTE — Progress Notes (Deleted)
Name: Jeffrey Whitaker   MRN: [id number]    DOB: Sep 25, 1960   Date:02/24/2023       Progress Note  Subjective  Chief Complaint  Annual Exam  HPI  Patient presents for annual CPE.  IPSS Questionnaire (AUA-7): Over the past month.   1)  How often have you had a sensation of not emptying your bladder completely after you finish urinating?  {Rating:19227}  2)  How often have you had to urinate again less than two hours after you finished urinating? {Rating:19227}  3)  How often have you found you stopped and started again several times when you urinated?  {Rating:19227}  4) How difficult have you found it to postpone urination?  {Rating:19227}  5) How often have you had a weak urinary stream?  {Rating:19227}  6) How often have you had to push or strain to begin urination?  {Rating:19227}  7) How many times did you most typically get up to urinate from the time you went to bed until the time you got up in the morning?  {Rating:19228}  Total score:  0-7 mildly symptomatic   8-19 moderately symptomatic   20-35 severely symptomatic     Diet: *** Exercise: *** Last Dental Exam: **** Last Eye Exam: ***  Depression: phq 9 is {gen pos neg:315643}    09/15/2022    8:13 AM 03/12/2022    8:29 AM 09/12/2021    8:43 AM 03/11/2021    8:10 AM 09/10/2020    8:01 AM  Depression screen PHQ 2/9  Decreased Interest 0 0 0 0 0  Down, Depressed, Hopeless 0 0 0 0 0  PHQ - 2 Score 0 0 0 0 0  Altered sleeping 0 0 0  0  Tired, decreased energy 0 0 0  0  Change in appetite 0 0 0  0  Feeling bad or failure about yourself  0 0 0  0  Trouble concentrating 0 0 0  0  Moving slowly or fidgety/restless 0 0 0  0  Suicidal thoughts 0 0 0  0  PHQ-9 Score 0 0 0  0    Hypertension:  BP Readings from Last 3 Encounters:  12/12/22 124/87  10/21/22 135/68  09/15/22 126/72    Obesity: Wt Readings from Last 3 Encounters:  12/12/22 295 lb 6 oz (134 kg)  10/21/22 294 lb (133.4 kg)  09/15/22 297 lb 6.4 oz  (134.9 kg)   BMI Readings from Last 3 Encounters:  12/12/22 42.38 kg/m  10/21/22 41.02 kg/m  09/15/22 41.48 kg/m     Lipids:  Lab Results  Component Value Date   CHOL 156 09/15/2022   CHOL 149 09/12/2021   CHOL 177 09/10/2020   Lab Results  Component Value Date   HDL 49 09/15/2022   HDL 47 09/12/2021   HDL 44 09/10/2020   Lab Results  Component Value Date   LDLCALC 87 09/15/2022   LDLCALC 82 09/12/2021   LDLCALC 112 (H) 09/10/2020   Lab Results  Component Value Date   TRIG 105 09/15/2022   TRIG 108 09/12/2021   TRIG 105 09/10/2020   Lab Results  Component Value Date   CHOLHDL 3.2 09/15/2022   CHOLHDL 3.2 09/12/2021   CHOLHDL 4.0 09/10/2020   No results found for: "LDLDIRECT" Glucose:  Glucose, Bld  Date Value Ref Range Status  09/15/2022 98 65 - 99 mg/dL Final    Comment:    .  Fasting reference interval .   09/12/2021 91 65 - 99 mg/dL Final    Comment:    .            Fasting reference interval .   09/10/2020 103 (H) 65 - 99 mg/dL Final    Comment:    .            Fasting reference interval . For someone without known diabetes, a glucose value between 100 and 125 mg/dL is consistent with prediabetes and should be confirmed with a follow-up test. .     Flowsheet Row [facility] from [facility]/14/2020 [facility]  AUDIT-C Score 0      Married STD testing and prevention (HIV/chl/gon/syphilis): 08/27/18 Sexual history:  Hep C Screening: 10/05/12 Skin cancer: Discussed monitoring for atypical lesions Colorectal cancer: 10/21/22 Prostate cancer:   Lab Results  Component Value Date   PSA 3.2 12/12/2014     Lung cancer:  Low Dose CT Chest recommended if Age 83-80 years, 30 pack-year currently smoking OR have quit w/in 15years. Patient  no a candidate for screening   AAA: The USPSTF recommends one-time screening with ultrasonography in men ages 50 to 75 years who have ever smoked. Patient   no, a candidate  for screening  ECG:  04/19/18  Vaccines:   HPV: N/A Tdap: up to date Shingrix: up to date Pneumonia: N/A Flu: up to date COVID-19: up to date  Advanced Care Planning: A voluntary discussion about advance care planning including the explanation and discussion of advance directives.  Discussed health care proxy and Living will, and the patient was able to identify a health care proxy as ***.  Patient does not have a living will and power of attorney of health care   Patient Active Problem List   Diagnosis Date Noted   Encounter for screening colonoscopy 10/21/2022   Morbid obesity (HCC) 09/10/2020   History of prostate cancer 08/23/2019   Allergic rhinitis, seasonal 04/15/2015   Benign hypertension 04/11/2015   Dyslipidemia 04/11/2015   Right medial knee pain 04/11/2015   Metabolic syndrome 04/11/2015   Left shoulder pain 04/11/2015   Umbilical hernia 04/11/2015   Microcytosis 04/11/2015    Past Surgical History:  Procedure Laterality Date   COLONOSCOPY     COLONOSCOPY WITH PROPOFOL N/A 10/21/2022   Procedure: COLONOSCOPY WITH PROPOFOL;  Surgeon: Toney Reil, MD;  Location: [facility];  Service: Endoscopy;  Laterality: N/A;   KNEE SURGERY Right    at age 84; clean out   PELVIC LYMPH NODE DISSECTION Bilateral 04/26/2018   Procedure: PELVIC LYMPH NODE DISSECTION;  Surgeon: Vanna Scotland, MD;  Location: [facility] OR[facility];  Service: Urology;  Laterality: Bilateral;   ROBOT ASSISTED LAPAROSCOPIC RADICAL PROSTATECTOMY N/A 04/26/2018   Procedure: ROBOTIC ASSISTED LAPAROSCOPIC RADICAL PROSTATECTOMY;  Surgeon: Vanna Scotland, MD;  Location: [facility];  Service: Urology;  Laterality: N/A;    Family History  Problem Relation Age of Onset   Hypertension Mother    CAD Mother    Lung cancer Mother    Benign prostatic hyperplasia Maternal Uncle    Prostate cancer Maternal Uncle    Hypertension Sister    Dementia Maternal Grandfather    Cancer Paternal Grandmother        Think  stomach or intestinal   Kidney disease Neg Hx    Bladder Cancer Neg Hx    Kidney cancer Neg Hx     Social History   Socioeconomic History   Marital  status: Married    Spouse name: Clydie Braun   Number of children: 2   Years of education: Not on file   Highest education level: Associate degree: occupational, Scientist, product/process development, or vocational program  Occupational History   Not on file  Tobacco Use   Smoking status: Former    Current packs/day: 0.00    Types: Cigarettes    Start date: 07/28/1980    Quit date: 07/28/1982    Years since quitting: 40.6   Smokeless tobacco: Former    Types: Chew    Quit date: 07/29/1987  Vaping Use   Vaping status: Never Used  Substance and Sexual Activity   Alcohol use: Yes    Alcohol/week: 0.0 standard drinks of alcohol    Comment: rarely   Drug use: No   Sexual activity: Yes    Partners: Female  Other Topics Concern   Not on file  Social History Narrative   Patient has 2 step-children   Social Determinants of Health   Financial Resource Strain: Low Risk  (09/12/2021)   Overall Financial Resource Strain (CARDIA)    Difficulty of Paying Living Expenses: Not hard at all  Food Insecurity: No Food Insecurity (09/12/2021)   Hunger Vital Sign    Worried About Running Out of Food in the Last Year: Never true    Ran Out of Food in the Last Year: Never true  Transportation Needs: No Transportation Needs (09/12/2021)   PRAPARE - Administrator, Civil Service (Medical): No    Lack of Transportation (Non-Medical): No  Physical Activity: Insufficiently Active (09/12/2021)   Exercise Vital Sign    Days of Exercise per Week: 3 days    Minutes of Exercise per Session: 40 min  Stress: No Stress Concern Present (09/12/2021)   [employer] of Occupational Health - Occupational Stress Questionnaire    Feeling of Stress : Not at all  Social Connections: Moderately Integrated (09/12/2021)   Social Connection and Isolation Panel [NHANES]    Frequency of  Communication with Friends and Family: More than three times a week    Frequency of Social Gatherings with Friends and Family: Three times a week    Attends Religious Services: Never    Active Member of Clubs or Organizations: Yes    Attends Banker Meetings: More than 4 times per year    Marital Status: Married  Catering manager Violence: Not At Risk (09/12/2021)   Humiliation, Afraid, Rape, and Kick questionnaire    Fear of Current or Ex-Partner: No    Emotionally Abused: No    Physically Abused: No    Sexually Abused: No     Current Outpatient Medications:    Multiple Vitamins-Minerals (MENS 50+ MULTI VITAMIN/MIN PO), Take 1 tablet by mouth daily., Disp: , Rfl:    NONFORMULARY OR COMPOUNDED ITEM, Trimix (30/2/20)-(Pap/Phent/PGE)  Dosage: Inject 0.7 cc per injection  Vial 1ml  Qty #10 Refills 6, Disp: 10 each, Rfl: 6   pravastatin (PRAVACHOL) 40 MG tablet, Take 1 tablet (40 mg total) by mouth daily., Disp: 90 tablet, Rfl: 1   valsartan-hydrochlorothiazide (DIOVAN-HCT) 80-12.5 MG tablet, Take 1 tablet by mouth daily., Disp: 90 tablet, Rfl: 1  No Known Allergies   ROS  ***   Objective  There were no vitals filed for this visit.  There is no height or weight on file to calculate BMI.  Physical Exam ***  Recent Results (from the past 2160 hour(s))  PSA     Status: None   Collection  Time: 12/04/22  8:44 AM  Result Value Ref Range   Prostate Specific Ag, Serum <0.1 0.0 - 4.0 ng/mL    Comment: Roche ECLIA methodology. According to the American Urological Association, Serum PSA should decrease and remain at undetectable levels after radical prostatectomy. The AUA defines biochemical recurrence as an initial PSA value 0.2 ng/mL or greater followed by a subsequent confirmatory PSA value 0.2 ng/mL or greater. Values obtained with different assay methods or kits cannot be used interchangeably. Results cannot be interpreted as absolute evidence of the presence or  absence of malignant disease.      Fall Risk:    09/15/2022    8:12 AM 03/12/2022    8:28 AM 09/12/2021    8:43 AM 03/11/2021    8:10 AM 09/10/2020    8:01 AM  Fall Risk   Falls in the past year? 0 0 0 0 0  Number falls in past yr:  0 0 0 0  Injury with Fall?  0 0 0 0  Risk for fall due to : No Fall Risks No Fall Risks No Fall Risks    Follow up Falls prevention discussed Falls prevention discussed Falls prevention discussed       Functional Status Survey:      Assessment & Plan  1. Well adult exam ***    -Prostate cancer screening and PSA options (with potential risks and benefits of testing vs not testing) were discussed along with recent recs/guidelines. -USPSTF grade A and B recommendations reviewed with patient; age-appropriate recommendations, preventive care, screening tests, etc discussed and encouraged; healthy living encouraged; see AVS for patient education given to patient -Discussed importance of 150 minutes of physical activity weekly, eat two servings of fish weekly, eat one serving of tree nuts ( cashews, pistachios, pecans, almonds.Marland Kitchen) every other day, eat 6 servings of fruit/vegetables daily and drink plenty of water and avoid sweet beverages.  -Reviewed Health Maintenance: yes

## 2023-02-25 ENCOUNTER — Encounter: Payer: Self-pay | Admitting: Family Medicine

## 2023-02-25 ENCOUNTER — Encounter: Payer: BC Managed Care – PPO | Admitting: Family Medicine

## 2023-02-25 DIAGNOSIS — Z0000 Encounter for general adult medical examination without abnormal findings: Secondary | ICD-10-CM

## 2023-03-16 NOTE — Progress Notes (Unsigned)
Name: Jeffrey Whitaker   MRN: [id number]    DOB: 1960/10/03   Date:03/17/2023       Progress Note  Subjective  Chief Complaint  Follow Up  HPI  Metabolic Syndrome: on diet and exercise, denies polyphagia, polyuria or polydipsia, A1C was up 6.5 % . We will recheck it today    HTN: he is taking medication as prescribed, denies chest pain or palpitation. BP is slightly elevated today   Morbid Obesity: his weight was down to 245 lbs in the beginning of 2020, his weight has been elevated in the 290 's range  BMI is over 40 . Discussed weight loss medication, and possible side effects. He denies personal history of pancreatitis or family history of thyroid cancer and we had sent Wegovy to pharmacy last year but due to [place] shortage he never filled medication. It looks like Zepbound is covered by insurance    History of prostate cancer: s/p surgery done 03/2018, under the care of Dr. Apolinar Junes, he is doing well, he finished  pelvic floor PT but is still doing the exercises at home.  Last PSA was normal done in May 2024 by Urologist  , staying below 0.1. He is on Trimix for ED .    Dyslipidemia: he is taking pravastatin, no myalgias, last LDL has been below 90 for the past couple of times.    Patient Active Problem List   Diagnosis Date Noted   Encounter for screening colonoscopy 10/21/2022   Morbid obesity (HCC) 09/10/2020   History of prostate cancer 08/23/2019   Allergic rhinitis, seasonal 04/15/2015   Benign hypertension 04/11/2015   Dyslipidemia 04/11/2015   Right medial knee pain 04/11/2015   Metabolic syndrome 04/11/2015   Left shoulder pain 04/11/2015   Umbilical hernia 04/11/2015   Microcytosis 04/11/2015    Past Surgical History:  Procedure Laterality Date   COLONOSCOPY     COLONOSCOPY WITH PROPOFOL N/A 10/21/2022   Procedure: COLONOSCOPY WITH PROPOFOL;  Surgeon: Toney Reil, MD;  Location: [facility];  Service: Endoscopy;  Laterality: N/A;   KNEE SURGERY  Right    at age 23; clean out   PELVIC LYMPH NODE DISSECTION Bilateral 04/26/2018   Procedure: PELVIC LYMPH NODE DISSECTION;  Surgeon: Vanna Scotland, MD;  Location: [facility];  Service: Urology;  Laterality: Bilateral;   ROBOT ASSISTED LAPAROSCOPIC RADICAL PROSTATECTOMY N/A 04/26/2018   Procedure: ROBOTIC ASSISTED LAPAROSCOPIC RADICAL PROSTATECTOMY;  Surgeon: Vanna Scotland, MD;  Location: [facility];  Service: Urology;  Laterality: N/A;    Family History  Problem Relation Age of Onset   Hypertension Mother    CAD Mother    Lung cancer Mother    Benign prostatic hyperplasia Maternal Uncle    Prostate cancer Maternal Uncle    Hypertension Sister    Dementia Maternal Grandfather    Cancer Paternal Grandmother        Think stomach or intestinal   Kidney disease Neg Hx    Bladder Cancer Neg Hx    Kidney cancer Neg Hx     Social History   Tobacco Use   Smoking status: Former    Current packs/day: 0.00    Types: Cigarettes    Start date: 07/28/1980    Quit date: 07/28/1982    Years since quitting: 40.6   Smokeless tobacco: Former    Types: Chew    Quit date: 07/29/1987  Substance Use Topics   Alcohol use: Yes    Alcohol/week: 0.0 standard drinks of alcohol  Comment: rarely     Current Outpatient Medications:    Multiple Vitamins-Minerals (MENS 50+ MULTI VITAMIN/MIN PO), Take 1 tablet by mouth daily., Disp: , Rfl:    NONFORMULARY OR COMPOUNDED ITEM, Trimix (30/2/20)-(Pap/Phent/PGE)  Dosage: Inject 0.7 cc per injection  Vial 1ml  Qty #10 Refills 6, Disp: 10 each, Rfl: 6   pravastatin (PRAVACHOL) 40 MG tablet, Take 1 tablet (40 mg total) by mouth daily., Disp: 90 tablet, Rfl: 1   valsartan-hydrochlorothiazide (DIOVAN-HCT) 80-12.5 MG tablet, Take 1 tablet by mouth daily., Disp: 90 tablet, Rfl: 1  No Known Allergies  I personally reviewed active problem list, medication list, allergies, family history, social history, health maintenance with the patient/caregiver  today.   ROS  Constitutional: Negative for fever or weight change.  Respiratory: Negative for cough and shortness of breath.   Cardiovascular: Negative for chest pain or palpitations.  Gastrointestinal: Negative for abdominal pain, no bowel changes.  Musculoskeletal: Negative for gait problem or joint swelling.  Skin: Negative for rash.  Neurological: Negative for dizziness or headache.  No other specific complaints in a complete review of systems (except as listed in HPI above).   Objective  Vitals:   03/17/23 0803  BP: 136/74  Pulse: 91  Resp: 16  SpO2: 98%  Weight: 296 lb (134.3 kg)  Height: 5\' 11"  (1.803 m)    Body mass index is 41.28 kg/m.  Physical Exam  Constitutional: Patient appears well-developed and well-nourished. Obese  No distress.  HEENT: head atraumatic, normocephalic, pupils equal and reactive to light, neck supple Cardiovascular: Normal rate, regular rhythm and normal heart sounds.  No murmur heard. No BLE edema. Pulmonary/Chest: Effort normal and breath sounds normal. No respiratory distress. Abdominal: Soft.  There is no tenderness. Psychiatric: Patient has a normal mood and affect. behavior is normal. Judgment and thought content normal.   PHQ2/9:    03/17/2023    8:03 AM 09/15/2022    8:13 AM 03/12/2022    8:29 AM 09/12/2021    8:43 AM 03/11/2021    8:10 AM  Depression screen PHQ 2/9  Decreased Interest 0 0 0 0 0  Down, Depressed, Hopeless 0 0 0 0 0  PHQ - 2 Score 0 0 0 0 0  Altered sleeping 0 0 0 0   Tired, decreased energy 0 0 0 0   Change in appetite 0 0 0 0   Feeling bad or failure about yourself  0 0 0 0   Trouble concentrating 0 0 0 0   Moving slowly or fidgety/restless 0 0 0 0   Suicidal thoughts 0 0 0 0   PHQ-9 Score 0 0 0 0     phq 9 is negative   Fall Risk:    03/17/2023    8:03 AM 09/15/2022    8:12 AM 03/12/2022    8:28 AM 09/12/2021    8:43 AM 03/11/2021    8:10 AM  Fall Risk   Falls in the past year? 0 0 0 0 0  Number  falls in past yr: 0  0 0 0  Injury with Fall? 0  0 0 0  Risk for fall due to : No Fall Risks No Fall Risks No Fall Risks No Fall Risks   Follow up Falls prevention discussed Falls prevention discussed Falls prevention discussed Falls prevention discussed       Functional Status Survey: Is the patient deaf or have difficulty hearing?: No Does the patient have difficulty seeing, even when wearing glasses/contacts?: No  Does the patient have difficulty concentrating, remembering, or making decisions?: No Does the patient have difficulty walking or climbing stairs?: No Does the patient have difficulty dressing or bathing?: No Does the patient have difficulty doing errands alone such as visiting a doctor's office or shopping?: No    Assessment & Plan  1. Dyslipidemia  - pravastatin (PRAVACHOL) 40 MG tablet; Take 1 tablet (40 mg total) by mouth daily.  Dispense: 90 tablet; Refill: 1  2. Morbid obesity (HCC)  Discussed with the patient the risk posed by an increased BMI. Discussed importance of portion control, calorie counting and at least 150 minutes of physical activity weekly. Avoid sweet beverages and drink more water. Eat at least 6 servings of fruit and vegetables daily    - tirzepatide (ZEPBOUND) 2.5 MG/0.5ML Pen; Inject 2.5 mg into the skin once a week.  Dispense: 2 mL; Refill: 0 - tirzepatide (ZEPBOUND) 5 MG/0.5ML Pen; Inject 5 mg into the skin once a week.  Dispense: 2 mL; Refill: 0  3. Metabolic syndrome  - POCT HgB M8U  4. Benign hypertension  - valsartan-hydrochlorothiazide (DIOVAN-HCT) 80-12.5 MG tablet; Take 1 tablet by mouth daily.  Dispense: 90 tablet; Refill: 1  5. History of prostate cancer  Keep follow up with urologist   6. ED (erectile dysfunction) of organic origin

## 2023-03-17 ENCOUNTER — Ambulatory Visit (INDEPENDENT_AMBULATORY_CARE_PROVIDER_SITE_OTHER): Payer: BC Managed Care – PPO | Admitting: Family Medicine

## 2023-03-17 ENCOUNTER — Encounter: Payer: Self-pay | Admitting: Family Medicine

## 2023-03-17 DIAGNOSIS — N529 Male erectile dysfunction, unspecified: Secondary | ICD-10-CM

## 2023-03-17 DIAGNOSIS — I10 Essential (primary) hypertension: Secondary | ICD-10-CM | POA: Diagnosis not present

## 2023-03-17 DIAGNOSIS — Z8546 Personal history of malignant neoplasm of prostate: Secondary | ICD-10-CM

## 2023-03-17 DIAGNOSIS — E8881 Metabolic syndrome: Secondary | ICD-10-CM | POA: Diagnosis not present

## 2023-03-17 DIAGNOSIS — E785 Hyperlipidemia, unspecified: Secondary | ICD-10-CM

## 2023-03-17 LAB — POCT GLYCOSYLATED HEMOGLOBIN (HGB A1C): Hemoglobin A1C: 6.1 % — AB (ref 4.0–5.6)

## 2023-03-17 MED ORDER — PRAVASTATIN SODIUM 40 MG PO TABS: 40.0000 mg | ORAL_TABLET | Freq: Every day | ORAL | 1 refills | Status: DC

## 2023-03-17 MED ORDER — ZEPBOUND 2.5 MG/0.5ML ~~LOC~~ SOAJ: 2.5000 mg | SUBCUTANEOUS | 0 refills | Status: DC

## 2023-03-17 MED ORDER — VALSARTAN-HYDROCHLOROTHIAZIDE 80-12.5 MG PO TABS: 1.0000 | ORAL_TABLET | Freq: Every day | ORAL | 1 refills | Status: DC

## 2023-03-17 MED ORDER — ZEPBOUND 5 MG/0.5ML ~~LOC~~ SOAJ: 5.0000 mg | SUBCUTANEOUS | 0 refills | Status: DC

## 2023-05-11 NOTE — Progress Notes (Unsigned)
Name: Jeffrey Whitaker   MRN: [id number]    DOB: 1961/05/12   Date:05/12/2023       Progress Note  Subjective  Chief Complaint  Annual Exam  HPI  Patient presents for annual CPE  DMII: his A1C was up to 6.5 % in Feb 2024, last visit one month ago down to 6.1 %, however he has noticed increase in hunger and thirsty today A1C is up again at 6.4 %. We will treat him for DM with Mounjaro at a dose of 2.5 mg   IPSS Questionnaire (AUA-7): Over the past month.   1)  How often have you had a sensation of not emptying your bladder completely after you finish urinating?  0 - Not at all  2)  How often have you had to urinate again less than two hours after you finished urinating? 1 - Less than 1 time in 5  3)  How often have you found you stopped and started again several times when you urinated?  0 - Not at all  4) How difficult have you found it to postpone urination?  0 - Not at all  5) How often have you had a weak urinary stream?  0 - Not at all  6) How often have you had to push or strain to begin urination?  0 - Not at all  7) How many times did you most typically get up to urinate from the time you went to bed until the time you got up in the morning?  0 - None  Total score:  0-7 mildly symptomatic   8-19 moderately symptomatic   20-35 severely symptomatic     Diet: he is trying to follow a low carbohydrate diet  Exercise: discussed 150 minutes per week  Last Dental Exam: 02/2023 Jeffrey Whitaker Last Eye Exam: 12/2022 Jeffrey Whitaker  Depression: phq 9 is negative    05/12/2023   11:14 AM 03/17/2023    8:03 AM 09/15/2022    8:13 AM 03/12/2022    8:29 AM 09/12/2021    8:43 AM  Depression screen PHQ 2/9  Decreased Interest 0 0 0 0 0  Down, Depressed, Hopeless 0 0 0 0 0  PHQ - 2 Score 0 0 0 0 0  Altered sleeping 0 0 0 0 0  Tired, decreased energy 0 0 0 0 0  Change in appetite 0 0 0 0 0  Feeling bad or failure about yourself  0 0 0 0 0  Trouble concentrating 0 0 0 0 0   Moving slowly or fidgety/restless 0 0 0 0 0  Suicidal thoughts 0 0 0 0 0  PHQ-9 Score 0 0 0 0 0    Hypertension:  BP Readings from Last 3 Encounters:  05/12/23 122/78  03/17/23 136/74  12/12/22 124/87    Obesity: Wt Readings from Last 3 Encounters:  05/12/23 292 lb 12.8 oz (132.8 kg)  03/17/23 296 lb (134.3 kg)  12/12/22 295 lb 6 oz (134 kg)   BMI Readings from Last 3 Encounters:  05/12/23 40.84 kg/m  03/17/23 41.28 kg/m  12/12/22 42.38 kg/m     Lipids:  Lab Results  Component Value Date   CHOL 156 09/15/2022   CHOL 149 09/12/2021   CHOL 177 09/10/2020   Lab Results  Component Value Date   HDL 49 09/15/2022   HDL 47 09/12/2021   HDL 44 09/10/2020   Lab Results  Component Value Date   LDLCALC 87 09/15/2022   LDLCALC  82 09/12/2021   LDLCALC 112 (H) 09/10/2020   Lab Results  Component Value Date   TRIG 105 09/15/2022   TRIG 108 09/12/2021   TRIG 105 09/10/2020   Lab Results  Component Value Date   CHOLHDL 3.2 09/15/2022   CHOLHDL 3.2 09/12/2021   CHOLHDL 4.0 09/10/2020   No results found for: "LDLDIRECT" Glucose:  Glucose, Bld  Date Value Ref Range Status  09/15/2022 98 65 - 99 mg/dL Final    Comment:    .            Fasting reference interval .   09/12/2021 91 65 - 99 mg/dL Final    Comment:    .            Fasting reference interval .   09/10/2020 103 (H) 65 - 99 mg/dL Final    Comment:    .            Fasting reference interval . For someone without known diabetes, a glucose value between 100 and 125 mg/dL is consistent with prediabetes and should be confirmed with a follow-up test. .     Flowsheet Row Office Visit from 05/12/2023 in [facility]  AUDIT-C Score 2        Married STD testing and prevention (HIV/chl/gon/syphilis):  not applicable Sexual history: one partner , doing well with Trimix  Hep C Screening: up to date  Skin cancer: Discussed monitoring for atypical lesions Colorectal  cancer: repeat in 2034  Prostate cancer:  treated for prostate cancer - PSA monitored by Jeffrey Whitaker  Lab Results  Component Value Date   PSA 3.2 12/12/2014     Lung cancer:  Low Dose CT Chest recommended if Age 85-80 years, 30 pack-year currently smoking OR have quit w/in 15years. Patient does  not applicable a candidate for screening   AAA: The USPSTF recommends one-time screening with ultrasonography in men ages 18 to 75 years who have ever smoked. We will check at age 49  ECG:  04/19/2018  Vaccines:   RSV: discussed with patient  Tdap: 02/10/2018 Shingrix: Completed Pneumonia: N/A Flu: 05/12/2023  COVID-19: 4 doses last was 02/19/224  Advanced Care Planning: A voluntary discussion about advance care planning including the explanation and discussion of advance directives.  Discussed health care proxy and Living will, and the patient was able to identify a health care proxy as spouse Jeffrey Whitaker.  Patient has a living will and power of attorney of health care   Patient Active Problem List   Diagnosis Date Noted   Encounter for screening colonoscopy 10/21/2022   Morbid obesity (HCC) 09/10/2020   History of prostate cancer 08/23/2019   Allergic rhinitis, seasonal 04/15/2015   Benign hypertension 04/11/2015   Dyslipidemia 04/11/2015   Right medial knee pain 04/11/2015   Metabolic syndrome 04/11/2015   Left shoulder pain 04/11/2015   Umbilical hernia 04/11/2015   Microcytosis 04/11/2015    Past Surgical History:  Procedure Laterality Date   COLONOSCOPY     COLONOSCOPY WITH PROPOFOL N/A 10/21/2022   Procedure: COLONOSCOPY WITH PROPOFOL;  Surgeon: Jeffrey Reil, MD;  Location: [facility];  Service: Endoscopy;  Laterality: N/A;   KNEE SURGERY Right    at age 79; clean out   PELVIC LYMPH NODE DISSECTION Bilateral 04/26/2018   Procedure: PELVIC LYMPH NODE DISSECTION;  Surgeon: Jeffrey Scotland, MD;  Location: [facility];  Service: Urology;  Laterality:  Bilateral;   ROBOT ASSISTED LAPAROSCOPIC RADICAL PROSTATECTOMY N/A 04/26/2018  Procedure: ROBOTIC ASSISTED LAPAROSCOPIC RADICAL PROSTATECTOMY;  Surgeon: Jeffrey Scotland, MD;  Location: [facility];  Service: Urology;  Laterality: N/A;    Family History  Problem Relation Age of Onset   Hypertension Mother    CAD Mother    Lung cancer Mother    Benign prostatic hyperplasia Maternal Uncle    Prostate cancer Maternal Uncle    Hypertension Sister    Dementia Maternal Grandfather    Cancer Paternal Grandmother        Think stomach or intestinal   Kidney disease Neg Hx    Bladder Cancer Neg Hx    Kidney cancer Neg Hx     Social History   Socioeconomic History   Marital status: Married    Spouse name: Clydie Braun   Number of children: 2   Years of education: Not on file   Highest education level: Associate degree: occupational, Scientist, product/process development, or vocational program  Occupational History   Not on file  Tobacco Use   Smoking status: Former    Current packs/day: 0.00    Types: Cigarettes    Start date: 07/28/1980    Quit date: 07/28/1982    Years since quitting: 40.8   Smokeless tobacco: Former    Types: Chew    Quit date: 07/29/1987  Vaping Use   Vaping status: Never Used  Substance and Sexual Activity   Alcohol use: Yes    Alcohol/week: 0.0 standard drinks of alcohol    Comment: rarely   Drug use: No   Sexual activity: Yes    Partners: Female  Other Topics Concern   Not on file  Social History Narrative   Patient has 2 step-children   Social Determinants of Health   Financial Resource Strain: Low Risk  (05/08/2023)   Overall Financial Resource Strain (CARDIA)    Difficulty of Paying Living Expenses: Not hard at all  Food Insecurity: No Food Insecurity (05/08/2023)   Hunger Vital Sign    Worried About Running Out of Food in the Last Year: Never true    Ran Out of Food in the Last Year: Never true  Transportation Needs: No Transportation Needs (05/08/2023)   PRAPARE - Therapist, art (Medical): No    Lack of Transportation (Non-Medical): No  Physical Activity: Insufficiently Active (05/08/2023)   Exercise Vital Sign    Days of Exercise per Week: 3 days    Minutes of Exercise per Session: 40 min  Stress: No Stress Concern Present (05/08/2023)   [employer] of Occupational Health - Occupational Stress Questionnaire    Feeling of Stress : Not at all  Social Connections: Moderately Integrated (05/08/2023)   Social Connection and Isolation Panel [NHANES]    Frequency of Communication with Friends and Family: More than three times a week    Frequency of Social Gatherings with Friends and Family: Twice a week    Attends Religious Services: 1 to 4 times per year    Active Member of [employer] or Organizations: No    Attends Engineer, structural: Not on file    Marital Status: Married  Catering manager Violence: Not At Risk (05/12/2023)   Humiliation, Afraid, Rape, and Kick questionnaire    Fear of Current or Ex-Partner: No    Emotionally Abused: No    Physically Abused: No    Sexually Abused: No     Current Outpatient Medications:    Multiple Vitamins-Minerals (MENS 50+ MULTI VITAMIN/MIN PO), Take 1 tablet by mouth daily., Disp: , Rfl:  NONFORMULARY OR COMPOUNDED ITEM, Trimix (30/2/20)-(Pap/Phent/PGE)  Dosage: Inject 0.7 cc per injection  Vial 1ml  Qty #10 Refills 6, Disp: 10 each, Rfl: 6   pravastatin (PRAVACHOL) 40 MG tablet, Take 1 tablet (40 mg total) by mouth daily., Disp: 90 tablet, Rfl: 1   tirzepatide (MOUNJARO) 2.5 MG/0.5ML Pen, Inject 2.5 mg into the skin once a week., Disp: 2 mL, Rfl: 0   valsartan-hydrochlorothiazide (DIOVAN-HCT) 80-12.5 MG tablet, Take 1 tablet by mouth daily., Disp: 90 tablet, Rfl: 1  No Known Allergies   ROS  Constitutional: Negative for fever or weight change.  Respiratory: Negative for cough and shortness of breath.   Cardiovascular: Negative for chest pain or palpitations.   Gastrointestinal: Negative for abdominal pain, no bowel changes.  Musculoskeletal: Negative for gait problem or joint swelling.  Skin: Negative for rash.  Neurological: Negative for dizziness or headache.  No other specific complaints in a complete review of systems (except as listed in HPI above).    Objective  Vitals:   05/12/23 1112  BP: 122/78  Pulse: 86  Resp: 18  Temp: 97.9 F (36.6 C)  TempSrc: Oral  SpO2: 97%  Weight: 292 lb 12.8 oz (132.8 kg)  Height: 5\' 11"  (1.803 m)    Body mass index is 40.84 kg/m.  Physical Exam  Constitutional: Patient appears well-developed and obese . No distress.  HENT: Head: Normocephalic and atraumatic. Ears: B TMs ok, no erythema or effusion; Nose: Nose normal. Mouth/Throat: Oropharynx is clear and moist. No oropharyngeal exudate.  Eyes: Conjunctivae and EOM are normal. Pupils are equal, round, and reactive to light. No scleral icterus.  Neck: Normal range of motion. Neck supple. No JVD present. No thyromegaly present.  Cardiovascular: Normal rate, regular rhythm and normal heart sounds.  No murmur heard. No BLE edema. Pulmonary/Chest: Effort normal and breath sounds normal. No respiratory distress. Abdominal: Soft. Bowel sounds are normal, no distension. There is no tenderness. no masses MALE GENITALIA: Not done  RECTAL: not done  Musculoskeletal: Normal range of motion, no joint effusions. No gross deformities Neurological: he is alert and oriented to person, place, and time. No cranial nerve deficit. Coordination, balance, strength, speech and gait are normal.  Skin: Skin is warm and dry. No rash noted. No erythema.  Psychiatric: Patient has a normal mood and affect. behavior is normal. Judgment and thought content normal.   Recent Results (from the past 2160 hour(s))  POCT HgB A1C     Status: Abnormal   Collection Time: 03/17/23  8:54 AM  Result Value Ref Range   Hemoglobin A1C 6.1 (A) 4.0 - 5.6 %   HbA1c POC (<> result, manual  entry)     HbA1c, POC (prediabetic range)     HbA1c, POC (controlled diabetic range)    POCT HgB A1C     Status: Abnormal   Collection Time: 05/12/23 11:18 AM  Result Value Ref Range   Hemoglobin A1C 6.4 (A) 4.0 - 5.6 %   HbA1c POC (<> result, manual entry)     HbA1c, POC (prediabetic range)     HbA1c, POC (controlled diabetic range)       Fall Risk:    05/12/2023   11:14 AM 03/17/2023    8:03 AM 09/15/2022    8:12 AM 03/12/2022    8:28 AM 09/12/2021    8:43 AM  Fall Risk   Falls in the past year? 0 0 0 0 0  Number falls in past yr:  0  0 0  Injury  with Fall?  0  0 0  Risk for fall due to : No Fall Risks No Fall Risks No Fall Risks No Fall Risks No Fall Risks  Follow up Falls prevention discussed;Education provided;Falls evaluation completed Falls prevention discussed Falls prevention discussed Falls prevention discussed Falls prevention discussed     Functional Status Survey: Is the patient deaf or have difficulty hearing?: No Does the patient have difficulty seeing, even when wearing glasses/contacts?: No Does the patient have difficulty concentrating, remembering, or making decisions?: No Does the patient have difficulty walking or climbing stairs?: No Does the patient have difficulty dressing or bathing?: No Does the patient have difficulty doing errands alone such as visiting a doctor's office or shopping?: No  Diabetic Foot Exam - Simple   Simple Foot Form Visual Inspection No deformities, no ulcerations, no other skin breakdown bilaterally: Yes Sensation Testing Intact to touch and monofilament testing bilaterally: Yes Pulse Check Posterior Tibialis and Dorsalis pulse intact bilaterally: Yes Comments      Assessment & Plan  1. Well adult exam  - Flu vaccine trivalent PF, 6mos and older(Flulaval,Afluria,Fluarix,Fluzone) - POCT HgB A1C  2. Need for immunization against influenza  - Flu vaccine trivalent PF, 6mos and  older(Flulaval,Afluria,Fluarix,Fluzone)  3. Hypertension associated with type 2 diabetes mellitus (HCC)  - tirzepatide (MOUNJARO) 2.5 MG/0.5ML Pen; Inject 2.5 mg into the skin once a week.  Dispense: 2 mL; Refill: 0    -Prostate cancer screening and PSA options (with potential risks and benefits of testing vs not testing) were discussed along with recent recs/guidelines. -USPSTF grade A and B recommendations reviewed with patient; age-appropriate recommendations, preventive care, screening tests, etc discussed and encouraged; healthy living encouraged; see AVS for patient education given to patient -Discussed importance of 150 minutes of physical activity weekly, eat two servings of fish weekly, eat one serving of tree nuts ( cashews, pistachios, pecans, almonds.Marland Kitchen) every other day, eat 6 servings of fruit/vegetables daily and drink plenty of water and avoid sweet beverages.  -Reviewed Health Maintenance: yes

## 2023-05-12 ENCOUNTER — Encounter: Payer: Self-pay | Admitting: Family Medicine

## 2023-05-12 ENCOUNTER — Ambulatory Visit: Payer: BC Managed Care – PPO | Admitting: Family Medicine

## 2023-05-12 VITALS — BP 122/78 | HR 86 | Temp 97.9°F | Resp 18 | Ht 71.0 in | Wt 292.8 lb

## 2023-05-12 DIAGNOSIS — I152 Hypertension secondary to endocrine disorders: Secondary | ICD-10-CM | POA: Diagnosis not present

## 2023-05-12 DIAGNOSIS — E8881 Metabolic syndrome: Secondary | ICD-10-CM

## 2023-05-12 DIAGNOSIS — Z0001 Encounter for general adult medical examination with abnormal findings: Secondary | ICD-10-CM | POA: Diagnosis not present

## 2023-05-12 DIAGNOSIS — E1159 Type 2 diabetes mellitus with other circulatory complications: Secondary | ICD-10-CM

## 2023-05-12 DIAGNOSIS — Z0000 Encounter for general adult medical examination without abnormal findings: Secondary | ICD-10-CM

## 2023-05-12 DIAGNOSIS — Z23 Encounter for immunization: Secondary | ICD-10-CM

## 2023-05-12 LAB — POCT GLYCOSYLATED HEMOGLOBIN (HGB A1C): Hemoglobin A1C: 6.4 % — AB (ref 4.0–5.6)

## 2023-05-12 MED ORDER — TIRZEPATIDE 2.5 MG/0.5ML ~~LOC~~ SOAJ: 2.5000 mg | SUBCUTANEOUS | 0 refills | Status: DC

## 2023-09-26 ENCOUNTER — Other Ambulatory Visit: Payer: Self-pay | Admitting: Family Medicine

## 2023-09-26 DIAGNOSIS — E785 Hyperlipidemia, unspecified: Secondary | ICD-10-CM

## 2023-09-26 DIAGNOSIS — I10 Essential (primary) hypertension: Secondary | ICD-10-CM

## 2023-09-28 NOTE — Telephone Encounter (Signed)
 Last f/u 02/2023

## 2023-10-15 ENCOUNTER — Ambulatory Visit (INDEPENDENT_AMBULATORY_CARE_PROVIDER_SITE_OTHER): Payer: BC Managed Care – PPO | Admitting: Family Medicine

## 2023-10-15 ENCOUNTER — Encounter: Payer: Self-pay | Admitting: Family Medicine

## 2023-10-15 DIAGNOSIS — Z23 Encounter for immunization: Secondary | ICD-10-CM | POA: Diagnosis not present

## 2023-10-15 DIAGNOSIS — E1159 Type 2 diabetes mellitus with other circulatory complications: Secondary | ICD-10-CM | POA: Diagnosis not present

## 2023-10-15 DIAGNOSIS — E785 Hyperlipidemia, unspecified: Secondary | ICD-10-CM

## 2023-10-15 DIAGNOSIS — E8881 Metabolic syndrome: Secondary | ICD-10-CM | POA: Diagnosis not present

## 2023-10-15 DIAGNOSIS — Z8546 Personal history of malignant neoplasm of prostate: Secondary | ICD-10-CM

## 2023-10-15 DIAGNOSIS — I10 Essential (primary) hypertension: Secondary | ICD-10-CM | POA: Diagnosis not present

## 2023-10-15 DIAGNOSIS — I152 Hypertension secondary to endocrine disorders: Secondary | ICD-10-CM | POA: Diagnosis not present

## 2023-10-15 LAB — POCT GLYCOSYLATED HEMOGLOBIN (HGB A1C): Hemoglobin A1C: 6 % — AB (ref 4.0–5.6)

## 2023-10-15 MED ORDER — PRAVASTATIN SODIUM 40 MG PO TABS: 40.0000 mg | ORAL_TABLET | Freq: Every day | ORAL | 1 refills | Status: DC

## 2023-10-15 MED ORDER — VALSARTAN-HYDROCHLOROTHIAZIDE 80-12.5 MG PO TABS: 1.0000 | ORAL_TABLET | Freq: Every day | ORAL | 1 refills | Status: DC

## 2023-10-15 NOTE — Progress Notes (Addendum)
 Name: Jeffrey Whitaker   MRN: [id number]    DOB: 27-Feb-1961   Date:10/15/2023       Progress Note  Subjective  Chief Complaint  Chief Complaint  Patient presents with   Medical Management of Chronic Issues   HPI   Metabolic Syndrome: on diet and exercise, denies polyphagia, polyuria or polydipsia, A1C is down from 6.4 % to 6 %. He has been more mindful about his diet. Cutting down on carbohydrates and avoiding late night snacking. He also resumed hiking with his friend a few weeks ago.   HTN: he is taking medication as prescribed, denies chest pain or palpitation.   Morbid Obesity: his weight was down to 245 lbs in the beginning of 2020, his weight has been elevated in the 290's range  BMI is over 40 . Discussed weight loss medication, and possible side effects. We tried sending GLP-1 agonist but not covered by insurance, discussed starting Metformin but he would like to try life style modification   History of prostate cancer: s/p surgery done 03/2018, under the care of Dr. Apolinar Junes, he is doing well, he finished  pelvic floor PT but is still doing the exercises at home.  Last PSA was normal done in May 2024 by Urologist  , staying below 0.1. He is on Trimix for ED and it works well for him.    Dyslipidemia: he is taking pravastatin, no myalgias, last LDL has been below 90 for the past couple of times We will recheck labs     Patient Active Problem List   Diagnosis Date Noted   Encounter for screening colonoscopy 10/21/2022   Morbid obesity (HCC) 09/10/2020   History of prostate cancer 08/23/2019   Allergic rhinitis, seasonal 04/15/2015   Hypertension associated with type 2 diabetes mellitus (HCC) 04/11/2015   Dyslipidemia 04/11/2015   Right medial knee pain 04/11/2015   Metabolic syndrome 04/11/2015   Left shoulder pain 04/11/2015   Umbilical hernia 04/11/2015   Microcytosis 04/11/2015    Past Surgical History:  Procedure Laterality Date   COLONOSCOPY     COLONOSCOPY WITH  PROPOFOL N/A 10/21/2022   Procedure: COLONOSCOPY WITH PROPOFOL;  Surgeon: Toney Reil, MD;  Location: [facility];  Service: Endoscopy;  Laterality: N/A;   KNEE SURGERY Right    at age 45; clean out   PELVIC LYMPH NODE DISSECTION Bilateral 04/26/2018   Procedure: PELVIC LYMPH NODE DISSECTION;  Surgeon: Vanna Scotland, MD;  Location: [facility];  Service: Urology;  Laterality: Bilateral;   ROBOT ASSISTED LAPAROSCOPIC RADICAL PROSTATECTOMY N/A 04/26/2018   Procedure: ROBOTIC ASSISTED LAPAROSCOPIC RADICAL PROSTATECTOMY;  Surgeon: Vanna Scotland, MD;  Location: [facility];  Service: Urology;  Laterality: N/A;    Family History  Problem Relation Age of Onset   Hypertension Mother    CAD Mother    Lung cancer Mother    Benign prostatic hyperplasia Maternal Uncle    Prostate cancer Maternal Uncle    Hypertension Sister    Dementia Maternal Grandfather    Cancer Paternal Grandmother        Think stomach or intestinal   Kidney disease Neg Hx    Bladder Cancer Neg Hx    Kidney cancer Neg Hx     Social History   Tobacco Use   Smoking status: Former    Current packs/day: 0.00    Types: Cigarettes    Start date: 07/28/1980    Quit date: 07/28/1982    Years since quitting: 41.2   Smokeless tobacco: Former  Types: Dorna Bloom    Quit date: 07/29/1987  Substance Use Topics   Alcohol use: Yes    Alcohol/week: 0.0 standard drinks of alcohol    Comment: rarely     Current Outpatient Medications:    Multiple Vitamins-Minerals (MENS 50+ MULTI VITAMIN/MIN PO), Take 1 tablet by mouth daily., Disp: , Rfl:    NONFORMULARY OR COMPOUNDED ITEM, Trimix (30/2/20)-(Pap/Phent/PGE)  Dosage: Inject 0.7 cc per injection  Vial 1ml  Qty #10 Refills 6, Disp: 10 each, Rfl: 6   pravastatin (PRAVACHOL) 40 MG tablet, Take 1 tablet by mouth once daily, Disp: 90 tablet, Rfl: 0   valsartan-hydrochlorothiazide (DIOVAN-HCT) 80-12.5 MG tablet, Take 1 tablet by mouth once daily, Disp: 90 tablet, Rfl: 0    tirzepatide (MOUNJARO) 2.5 MG/0.5ML Pen, Inject 2.5 mg into the skin once a week., Disp: 2 mL, Rfl: 0  No Known Allergies  I personally reviewed active problem list, medication list, allergies, family history with the patient/caregiver today.   ROS  Ten systems reviewed and is negative except as mentioned in HPI    Objective  Vitals:   10/15/23 0755  BP: 134/82  Pulse: 92  Resp: 16  SpO2: 98%  Weight: 295 lb 3.2 oz (133.9 kg)  Height: 5\' 11"  (1.803 m)    Body mass index is 41.17 kg/m.  Physical Exam  Constitutional: Patient appears well-developed and well-nourished. Obese  No distress.  HEENT: head atraumatic, normocephalic, pupils equal and reactive to light, neck supple Cardiovascular: Normal rate, regular rhythm and normal heart sounds.  No murmur heard. No BLE edema. Pulmonary/Chest: Effort normal and breath sounds normal. No respiratory distress. Abdominal: Soft.  There is no tenderness. Psychiatric: Patient has a normal mood and affect. behavior is normal. Judgment and thought content normal.   Recent Results (from the past 2160 hours)  POCT glycosylated hemoglobin (Hb A1C)     Status: Abnormal   Collection Time: 10/15/23  8:00 AM  Result Value Ref Range   Hemoglobin A1C 6.0 (A) 4.0 - 5.6 %   HbA1c POC (<> result, manual entry)     HbA1c, POC (prediabetic range)     HbA1c, POC (controlled diabetic range)      Diabetic Foot Exam:     PHQ2/9:    10/15/2023    7:54 AM 05/12/2023   11:14 AM 03/17/2023    8:03 AM 09/15/2022    8:13 AM 03/12/2022    8:29 AM  Depression screen PHQ 2/9  Decreased Interest 0 0 0 0 0  Down, Depressed, Hopeless 0 0 0 0 0  PHQ - 2 Score 0 0 0 0 0  Altered sleeping 0 0 0 0 0  Tired, decreased energy 0 0 0 0 0  Change in appetite 0 0 0 0 0  Feeling bad or failure about yourself  0 0 0 0 0  Trouble concentrating 0 0 0 0 0  Moving slowly or fidgety/restless 0 0 0 0 0  Suicidal thoughts 0 0 0 0 0  PHQ-9 Score 0 0 0 0 0  Difficult  doing work/chores Not difficult at all        phq 9 is negative  Fall Risk:    05/12/2023   11:14 AM 03/17/2023    8:03 AM 09/15/2022    8:12 AM 03/12/2022    8:28 AM 09/12/2021    8:43 AM  Fall Risk   Falls in the past year? 0 0 0 0 0  Number falls in past yr:  0  0 0  Injury with Fall?  0  0 0  Risk for fall due to : No Fall Risks No Fall Risks No Fall Risks No Fall Risks No Fall Risks  Follow up Falls prevention discussed;Education provided;Falls evaluation completed Falls prevention discussed Falls prevention discussed Falls prevention discussed Falls prevention discussed     Assessment & Plan  1. Morbid obesity (HCC) (Primary)  Discussed with the patient the risk posed by an increased BMI. Discussed importance of portion control, calorie counting and at least 150 minutes of physical activity weekly. Avoid sweet beverages and drink more water. Eat at least 6 servings of fruit and vegetables daily    2. Dyslipidemia  - Lipid panel - pravastatin (PRAVACHOL) 40 MG tablet; Take 1 tablet (40 mg total) by mouth daily.  Dispense: 90 tablet; Refill: 1  3. Metabolic syndrome  - POCT glycosylated hemoglobin (Hb A1C) - Microalbumin / creatinine urine ratio  4. Benign hypertension  - COMPLETE METABOLIC PANEL WITH GFR - CBC with Differential/Platelet - valsartan-hydrochlorothiazide (DIOVAN-HCT) 80-12.5 MG tablet; Take 1 tablet by mouth daily.  Dispense: 90 tablet; Refill: 1  5. History of prostate cancer  Up to date with visits with Urologist    6. Need for pneumococcal 20-valent conjugate vaccination  - Pneumococcal conjugate vaccine 20-valent (Prevnar 20)

## 2023-10-15 NOTE — Addendum Note (Signed)
 Addended by: Ruel Favors on: 10/15/2023 08:25 AM   Modules accepted: Orders

## 2023-10-16 ENCOUNTER — Encounter: Payer: Self-pay | Admitting: Family Medicine

## 2023-10-16 LAB — COMPLETE METABOLIC PANEL WITH GFR
AG Ratio: 1.7 (calc) (ref 1.0–2.5)
ALT: 21 U/L (ref 9–46)
AST: 16 U/L (ref 10–35)
Albumin: 4.4 g/dL (ref 3.6–5.1)
Alkaline phosphatase (APISO): 51 U/L (ref 35–144)
BUN: 15 mg/dL (ref 7–25)
CO2: 29 mmol/L (ref 20–32)
Calcium: 9.4 mg/dL (ref 8.6–10.3)
Chloride: 106 mmol/L (ref 98–110)
Creat: 1.22 mg/dL (ref 0.70–1.35)
Globulin: 2.6 g/dL (ref 1.9–3.7)
Glucose, Bld: 107 mg/dL — ABNORMAL HIGH (ref 65–99)
Potassium: 4.2 mmol/L (ref 3.5–5.3)
Sodium: 143 mmol/L (ref 135–146)
Total Bilirubin: 0.5 mg/dL (ref 0.2–1.2)
Total Protein: 7 g/dL (ref 6.1–8.1)

## 2023-10-16 LAB — LIPID PANEL
Cholesterol: 163 mg/dL (ref <200)
HDL: 47 mg/dL (ref >=40)
LDL Cholesterol (Calc): 96 mg/dL
Non-HDL Cholesterol (Calc): 116 mg/dL (ref <130)
Total CHOL/HDL Ratio: 3.5 (calc) (ref <5.0)
Triglycerides: 102 mg/dL (ref <150)

## 2023-10-16 LAB — CBC WITH DIFFERENTIAL/PLATELET
Absolute Lymphocytes: 1569 {cells}/uL (ref 850–3900)
Absolute Monocytes: 484 {cells}/uL (ref 200–950)
Basophils Absolute: 59 {cells}/uL (ref 0–200)
Basophils Relative: 1 %
Eosinophils Absolute: 89 {cells}/uL (ref 15–500)
Eosinophils Relative: 1.5 %
HCT: 47 % (ref 38.5–50.0)
Hemoglobin: 14.3 g/dL (ref 13.2–17.1)
MCH: 23.5 pg — ABNORMAL LOW (ref 27.0–33.0)
MCHC: 30.4 g/dL — ABNORMAL LOW (ref 32.0–36.0)
MCV: 77.2 fL — ABNORMAL LOW (ref 80.0–100.0)
MPV: 10.7 fL (ref 7.5–12.5)
Monocytes Relative: 8.2 %
Neutro Abs: 3699 {cells}/uL (ref 1500–7800)
Neutrophils Relative %: 62.7 %
Platelets: 197 10*3/uL (ref 140–400)
RBC: 6.09 10*6/uL — ABNORMAL HIGH (ref 4.20–5.80)
RDW: 13.3 % (ref 11.0–15.0)
Total Lymphocyte: 26.6 %
WBC: 5.9 10*3/uL (ref 3.8–10.8)

## 2023-10-16 LAB — MICROALBUMIN / CREATININE URINE RATIO
Creatinine, Urine: 185 mg/dL (ref 20–320)
Microalb Creat Ratio: 4 mg/g{creat} (ref <30)
Microalb, Ur: 0.7 mg/dL

## 2023-12-03 ENCOUNTER — Other Ambulatory Visit: Payer: Self-pay

## 2023-12-03 DIAGNOSIS — C61 Malignant neoplasm of prostate: Secondary | ICD-10-CM | POA: Diagnosis not present

## 2023-12-04 LAB — PSA: Prostate Specific Ag, Serum: <0.1 ng/mL (ref 0.0–4.0)

## 2023-12-09 ENCOUNTER — Ambulatory Visit (INDEPENDENT_AMBULATORY_CARE_PROVIDER_SITE_OTHER): Payer: Self-pay | Admitting: Urology

## 2023-12-09 VITALS — BP 129/86 | HR 63 | Ht 71.0 in | Wt 295.4 lb

## 2023-12-09 DIAGNOSIS — N5231 Erectile dysfunction following radical prostatectomy: Secondary | ICD-10-CM

## 2023-12-09 DIAGNOSIS — C61 Malignant neoplasm of prostate: Secondary | ICD-10-CM

## 2023-12-09 MED ORDER — NONFORMULARY OR COMPOUNDED ITEM: 6 refills | Status: AC

## 2023-12-09 NOTE — Progress Notes (Signed)
 Elfrieda Grise Plume,acting as a scribe for Dustin Gimenez, MD.,have documented all relevant documentation on the behalf of Dustin Gimenez, MD,as directed by  Dustin Gimenez, MD while in the presence of Dustin Gimenez, MD.  12/09/23 1:38 PM   Emmaline Haring 1961/05/13 [id number]  Referring provider: Arleen Lacer, MD [address] [place],  [place] [address]  Chief Complaint  Patient presents with   Prostate Cancer    HPI:  63 year old male with a personal history of prostate cancer status post-prostatectomy in 2019 and erectile dysfunction presents today for an annual follow-up.   He is s/p uncomplicated partial nerve sparing procedure on 04/16/2018. Surgical pathology showed Gleason 3+4 prostate cancer without evidence of extracapsular extension.  Margins negative.  No seminal vesicle and bladder involvement.  Lymph nodes negative. pT2 N0.  His PSA remains undetectable, and he has been using Trimix, which was working well for him.   He recently changed his pharmacy to [facility] in [place] and was using 0.7ccs. He reports that the frequency of using Trimix is decreasing, but it still works. He is considering phasing it out.   He is aware of the importance of annual PSA checks and plans to continue monitoring it with his primary care provider.   He is also maintaining a routine of workouts a couple of days a week but acknowledges the need to increase his activity level.   PMH: Past Medical History:  Diagnosis Date   Elevated PSA    Hypertension    Prostate cancer (HCC) 2019   Umbilical hernia 2019    Surgical History: Past Surgical History:  Procedure Laterality Date   COLONOSCOPY     COLONOSCOPY WITH PROPOFOL  N/A 10/21/2022   Procedure: COLONOSCOPY WITH PROPOFOL ;  Surgeon: Selena Daily, MD;  Location: [facility];  Service: Endoscopy;  Laterality: N/A;   KNEE SURGERY Right    at age 13; clean out   PELVIC LYMPH NODE  DISSECTION Bilateral 04/26/2018   Procedure: PELVIC LYMPH NODE DISSECTION;  Surgeon: Dustin Gimenez, MD;  Location: [facility];  Service: Urology;  Laterality: Bilateral;   ROBOT ASSISTED LAPAROSCOPIC RADICAL PROSTATECTOMY N/A 04/26/2018   Procedure: ROBOTIC ASSISTED LAPAROSCOPIC RADICAL PROSTATECTOMY;  Surgeon: Dustin Gimenez, MD;  Location: [facility];  Service: Urology;  Laterality: N/A;    Home Medications:  Allergies as of 12/09/2023   No Known Allergies      Medication List        Accurate as of Dec 09, 2023  1:38 PM. If you have any questions, ask your nurse or doctor.          cholecalciferol 25 MCG (1000 UNIT) tablet Commonly known as: VITAMIN D3 Take 1,000 Units by mouth daily.   MENS 50+ MULTI VITAMIN/MIN PO Take 1 tablet by mouth daily.   NONFORMULARY OR COMPOUNDED ITEM Trimix (30/2/20)-(Pap/Phent/PGE)  Dosage: Inject 0.7 cc per injection  Vial 1ml  Qty #10 Refills 6   pravastatin  40 MG tablet Commonly known as: PRAVACHOL  Take 1 tablet (40 mg total) by mouth daily.   valsartan -hydrochlorothiazide  80-12.5 MG tablet Commonly known as: DIOVAN -HCT Take 1 tablet by mouth daily.        Family History: Family History  Problem Relation Age of Onset   Hypertension Mother    CAD Mother    Lung cancer Mother    Benign prostatic hyperplasia Maternal Uncle    Prostate cancer Maternal Uncle    Hypertension Sister    Dementia Maternal Grandfather  Cancer Paternal Grandmother        Think stomach or intestinal   Kidney disease Neg Hx    Bladder Cancer Neg Hx    Kidney cancer Neg Hx     Social History:  reports that he quit smoking about 41 years ago. His smoking use included cigarettes. He started smoking about 43 years ago. He quit smokeless tobacco use about 36 years ago.  His smokeless tobacco use included chew. He reports current alcohol use. He reports that he does not use drugs.   Physical Exam: BP 129/86   Pulse 63   Ht 5\' 11"  (1.803 m)   Wt  295 lb 6 oz (134 kg)   BMI 41.20 kg/m   Constitutional:  Alert and oriented, No acute distress. HEENT: [place] AT, moist mucus membranes.  Trachea midline, no masses. Neurologic: Grossly intact, no focal deficits, moving all 4 extremities. Psychiatric: Normal mood and affect.   Assessment & Plan:    1. Prostate Cancer Status Post-Prostatectomy - PSA remains undetectable, which is excellent and indicates a low probability of recurrence.  - Continue annual PSA monitoring to ensure it remains undetectable.  - He is advised to have PSA checked annually by his primary care provider, Dr. Ava Lei, and to be vigilant for any changes.  2. Erectile Dysfunction - He is currently using Trimix, obtained from [facility].  - He reports a decrease in the frequency of use but states it is still effective.  - A refill for Trimix will be faxed to the pharmacy for the upcoming year.  - He is advised to schedule a virtual visit with a PA for future refills if needed, ensuring compliance with [place]  medical license requirements for annual review.   Return in about 1 year (around 12/08/2024) for repeat PSA and Trimix refills.  I have reviewed the above documentation for accuracy and completeness, and I agree with the above.   Dustin Gimenez, MD   [facility] [address], [address] [place], [place] [address] [phone]

## 2024-04-15 ENCOUNTER — Ambulatory Visit: Admitting: Family Medicine

## 2024-04-15 ENCOUNTER — Encounter: Payer: Self-pay | Admitting: Family Medicine

## 2024-04-15 VITALS — BP 132/84 | HR 93 | Temp 98.3°F | Resp 16 | Ht 71.0 in | Wt 299.0 lb

## 2024-04-15 DIAGNOSIS — E785 Hyperlipidemia, unspecified: Secondary | ICD-10-CM | POA: Diagnosis not present

## 2024-04-15 DIAGNOSIS — E1159 Type 2 diabetes mellitus with other circulatory complications: Secondary | ICD-10-CM

## 2024-04-15 DIAGNOSIS — L308 Other specified dermatitis: Secondary | ICD-10-CM

## 2024-04-15 DIAGNOSIS — Z8546 Personal history of malignant neoplasm of prostate: Secondary | ICD-10-CM

## 2024-04-15 DIAGNOSIS — I152 Hypertension secondary to endocrine disorders: Secondary | ICD-10-CM | POA: Diagnosis not present

## 2024-04-15 DIAGNOSIS — Z23 Encounter for immunization: Secondary | ICD-10-CM

## 2024-04-15 DIAGNOSIS — E669 Obesity, unspecified: Secondary | ICD-10-CM | POA: Diagnosis not present

## 2024-04-15 DIAGNOSIS — E1169 Type 2 diabetes mellitus with other specified complication: Secondary | ICD-10-CM | POA: Diagnosis not present

## 2024-04-15 DIAGNOSIS — N529 Male erectile dysfunction, unspecified: Secondary | ICD-10-CM

## 2024-04-15 LAB — POCT GLYCOSYLATED HEMOGLOBIN (HGB A1C): Hemoglobin A1C: 6.3 % — AB (ref 4.0–5.6)

## 2024-04-15 MED ORDER — VALSARTAN-HYDROCHLOROTHIAZIDE 80-12.5 MG PO TABS: 1.0000 | ORAL_TABLET | Freq: Every day | ORAL | 1 refills | Status: AC

## 2024-04-15 MED ORDER — PRAVASTATIN SODIUM 40 MG PO TABS: 40.0000 mg | ORAL_TABLET | Freq: Every day | ORAL | 1 refills | Status: AC

## 2024-04-15 MED ORDER — TIRZEPATIDE 2.5 MG/0.5ML ~~LOC~~ SOAJ: 2.5000 mg | SUBCUTANEOUS | 0 refills | Status: DC

## 2024-04-15 MED ORDER — TRIAMCINOLONE ACETONIDE 0.1 % EX CREA: 1.0000 | TOPICAL_CREAM | Freq: Two times a day (BID) | CUTANEOUS | 0 refills | Status: AC

## 2024-04-15 NOTE — Progress Notes (Signed)
 Name: Jeffrey Whitaker   MRN: [id number]    DOB: May 04, 1961   Date:04/15/2024       Progress Note  Subjective  Chief Complaint  Chief Complaint  Patient presents with   Medical Management of Chronic Issues   Hyperlipidemia   Hypertension   Discussed the use of AI scribe software for clinical note transcription with the patient, who gave verbal consent to proceed.  History of Present Illness Jeffrey Whitaker is a 63 year old male with type 2 diabetes and obesity who presents for follow-up on his diabetes management and weight concerns.  He has type 2 diabetes with an A1c of 6.5% noted in February of last year. His fasting glucose levels have not exceeded 126 mg/dL however he has symptoms of diabetes such as polyphagia and episodes of polydipsia . His A1c today in the office was 6.3% . He is active and attempts to manage his weight through hiking but struggles with weight loss and about being consistent with a low carbohydrate diet.   He has  obesity, with a BMI over 40. His weight had decreased to 245 pounds but has since increased and weight is close to 300 lbs today. He enjoys cooking and barbecuing, which he acknowledges contributes to his weight challenges. He works from home two days a week and is in the office the other days, noting a significant decrease in physical activity due to being 'chained to the desk.' He is working on portion control, avoiding eating after 7 PM, reducing carbohydrates, and increasing protein intake. He exercises with a personal trainer twice a week and tries to walk two to three times a week. He plans to increase his cardio exercise by using his gym membership.  He is currently taking pravastatin  for dyslipidemia, with his last LDL cholesterol level at 96 mg/dL, down from 887 mg/dL. He reports no side effects from pravastatin .  He has a history of prostate cancer, with surgery performed in 2019.  He last saw his urologist, Dr. Karl , in May and states he has  been released and from now on I can check his PSA yearly   He experiences eczema on his elbows, which he attributes to leaning on them frequently. He uses cocoa butter, Vaseline, and lotion to manage the condition, which flares up occasionally. The eczema does not itch but can become bothersome.  No history of pancreatitis or family history of thyroid medullary adenocarcinoma.    Patient Active Problem List   Diagnosis Date Noted   Morbid obesity (HCC) 09/10/2020   History of prostate cancer 08/23/2019   Allergic rhinitis, seasonal 04/15/2015   Dyslipidemia 04/11/2015   Right medial knee pain 04/11/2015   Metabolic syndrome 04/11/2015   Left shoulder pain 04/11/2015   Umbilical hernia 04/11/2015   Microcytosis 04/11/2015    Past Surgical History:  Procedure Laterality Date   COLONOSCOPY     COLONOSCOPY WITH PROPOFOL  N/A 10/21/2022   Procedure: COLONOSCOPY WITH PROPOFOL ;  Surgeon: Unk Corinn Skiff, MD;  Location: [facility];  Service: Endoscopy;  Laterality: N/A;   HERNIA REPAIR     KNEE SURGERY Right    at age 32; clean out   PELVIC LYMPH NODE DISSECTION Bilateral 04/26/2018   Procedure: PELVIC LYMPH NODE DISSECTION;  Surgeon: Penne Knee, MD;  Location: [facility];  Service: Urology;  Laterality: Bilateral;   ROBOT ASSISTED LAPAROSCOPIC RADICAL PROSTATECTOMY N/A 04/26/2018   Procedure: ROBOTIC ASSISTED LAPAROSCOPIC RADICAL PROSTATECTOMY;  Surgeon: Penne Knee, MD;  Location: [facility];  Service: Urology;  Laterality: N/A;    Family History  Problem Relation Age of Onset   Hypertension Mother    CAD Mother    Lung cancer Mother    Cancer Mother    Benign prostatic hyperplasia Maternal Uncle    Prostate cancer Maternal Uncle    Hypertension Sister    Dementia Maternal Grandfather    Cancer Paternal Grandmother        Think stomach or intestinal   Kidney disease Neg Hx    Bladder Cancer Neg Hx    Kidney cancer Neg Hx     Social History   Tobacco Use    Smoking status: Former    Current packs/day: 0.00    Types: Cigarettes    Start date: 07/28/1980    Quit date: 07/28/1982    Years since quitting: 41.7   Smokeless tobacco: Former    Types: Chew    Quit date: 07/29/1987  Substance Use Topics   Alcohol use: Yes    Alcohol/week: 0.0 standard drinks of alcohol    Comment: rarely     Current Outpatient Medications:    cholecalciferol (VITAMIN D3) 25 MCG (1000 UNIT) tablet, Take 1,000 Units by mouth daily., Disp: , Rfl:    Multiple Vitamins-Minerals (MENS 50+ MULTI VITAMIN/MIN PO), Take 1 tablet by mouth daily., Disp: , Rfl:    NONFORMULARY OR COMPOUNDED ITEM, Trimix (30/2/20)-(Pap/Phent/PGE)  Dosage: Inject 0.7 cc per injection  Vial 1ml  Qty #10 Refills 6, Disp: 10 each, Rfl: 6   pravastatin  (PRAVACHOL ) 40 MG tablet, Take 1 tablet (40 mg total) by mouth daily., Disp: 90 tablet, Rfl: 1   valsartan -hydrochlorothiazide  (DIOVAN -HCT) 80-12.5 MG tablet, Take 1 tablet by mouth daily., Disp: 90 tablet, Rfl: 1  No Known Allergies  I personally reviewed active problem list, medication list, allergies, family history with the patient/caregiver today.   ROS  Ten systems reviewed and is negative except as mentioned in HPI    Objective Physical Exam  CONSTITUTIONAL: Patient appears well-developed and well-nourished. No distress. HEENT: Head atraumatic, normocephalic, neck supple. CARDIOVASCULAR: Normal rate, regular rhythm and normal heart sounds. No murmur heard. Trace edema on legs. PULMONARY: Effort normal and breath sounds normal. Lungs clear to auscultation. No respiratory distress. ABDOMINAL: There is no tenderness or distention. MUSCULOSKELETAL: Normal gait. Without gross motor or sensory deficit. PSYCHIATRIC: Patient has a normal mood and affect. Behavior is normal. Judgment and thought content normal. SKIN: Eczema on legs.     Vitals:   04/15/24 0834  BP: 132/84  Pulse: 93  Resp: 16  Temp: 98.3 F (36.8 C)  SpO2: 96%   Weight: 299 lb (135.6 kg)  Height: 5' 11 (1.803 m)    Body mass index is 41.7 kg/m.   PHQ2/9:    04/15/2024    8:31 AM 10/15/2023    7:54 AM 05/12/2023   11:14 AM 03/17/2023    8:03 AM 09/15/2022    8:13 AM  Depression screen PHQ 2/9  Decreased Interest 0 0 0 0 0  Down, Depressed, Hopeless 0 0 0 0 0  PHQ - 2 Score 0 0 0 0 0  Altered sleeping  0 0 0 0  Tired, decreased energy  0 0 0 0  Change in appetite  0 0 0 0  Feeling bad or failure about yourself   0 0 0 0  Trouble concentrating  0 0 0 0  Moving slowly or fidgety/restless  0 0 0 0  Suicidal thoughts  0 0 0  0  PHQ-9 Score  0 0 0 0  Difficult doing work/chores  Not difficult at all       phq 9 is negative  Fall Risk:    04/15/2024    8:31 AM 05/12/2023   11:14 AM 03/17/2023    8:03 AM 09/15/2022    8:12 AM 03/12/2022    8:28 AM  Fall Risk   Falls in the past year? 0 0 0 0 0  Number falls in past yr: 0  0  0  Injury with Fall? 0  0  0  Risk for fall due to : No Fall Risks No Fall Risks No Fall Risks No Fall Risks No Fall Risks  Follow up Falls evaluation completed Falls prevention discussed;Education provided;Falls evaluation completed Falls prevention discussed Falls prevention discussed Falls prevention discussed      Data saved with a previous flowsheet row definition     Assessment & Plan Type 2 diabetes mellitus with associated HTN and dyslipidemia, obesity - Prescribed Mounjaro  2.5 mg weekly, with potential increase to 5 mg and 7.5 mg based on tolerance and effectiveness. - Monitor A1c and symptoms. - Document diabetes diagnosis for insurance coverage.  Morbid obesity, BMI =40 BMI over 40 with weight fluctuations. Engaged in personal training and plans to increase cardio. - Encourage lifestyle modifications including portion control and increased physical activity. - Monitor weight and BMI.  Mixed hyperlipidemia LDL at 96 mg/dL, target below 70 mg/dL for diabetes. Prefers current medication. -  Continue pravastatin . - Re-evaluate LDL levels at next visit.  Essential hypertension Blood pressure slightly elevated at 132/80 mmHg, but home readings are within target range. - Continue current hypertension management. - Monitor blood pressure at home.  Malignant neoplasm of prostate Prostate cancer treated with surgery in 2019. No positive PSA since surgery. - Monitor PSA levels annually. - Refer to urologist if PSA is above 0.1 ng/mL.  Eczema of bilateral elbows Eczema on elbows, likely due to pressure. Current emollients insufficient. - Prescribe triamcinolone  cream for elbows, mix with Vaseline, apply twice daily. - Advise against using triamcinolone  on sensitive skin areas. - Encourage minimizing pressure on elbows.

## 2024-05-02 ENCOUNTER — Other Ambulatory Visit: Payer: Self-pay | Admitting: Family Medicine

## 2024-05-02 ENCOUNTER — Encounter: Payer: Self-pay | Admitting: Family Medicine

## 2024-05-02 MED ORDER — TIRZEPATIDE-WEIGHT MANAGEMENT 5 MG/0.5ML ~~LOC~~ SOLN: 5.0000 mg | SUBCUTANEOUS | 0 refills | Status: DC

## 2024-05-03 ENCOUNTER — Other Ambulatory Visit: Payer: Self-pay | Admitting: Family Medicine

## 2024-05-03 ENCOUNTER — Telehealth: Payer: Self-pay | Admitting: Pharmacy Technician

## 2024-05-03 ENCOUNTER — Other Ambulatory Visit (HOSPITAL_COMMUNITY): Payer: Self-pay

## 2024-05-03 MED ORDER — ZEPBOUND 5 MG/0.5ML ~~LOC~~ SOAJ: 5.0000 mg | SUBCUTANEOUS | 0 refills | Status: DC

## 2024-05-03 NOTE — Telephone Encounter (Signed)
 Thank you :)

## 2024-05-03 NOTE — Telephone Encounter (Signed)
 Good morning, I have a PA request in [facility] for Zepbound  5mg /0.76ml pen injectors but I see that Mr. Gunnerson is on Mounjaro  5mg  so I called [facility] (which I believe they entered the prescription in wrong) and the Dannell Gortney lady I spoke to stated the problem was that the prescription was sent in for vials and they can not fill for vials so they need a new order sent in for Mounjaro  5mg  pens.  I'm going to archive this PA request

## 2024-05-04 ENCOUNTER — Other Ambulatory Visit (HOSPITAL_COMMUNITY): Payer: Self-pay

## 2024-05-05 ENCOUNTER — Other Ambulatory Visit: Payer: Self-pay | Admitting: Family Medicine

## 2024-05-05 ENCOUNTER — Other Ambulatory Visit: Payer: Self-pay

## 2024-05-05 MED ORDER — TIRZEPATIDE 5 MG/0.5ML ~~LOC~~ SOAJ: 5.0000 mg | SUBCUTANEOUS | 0 refills | Status: DC

## 2024-05-05 NOTE — Progress Notes (Unsigned)
 You sent Zepbound  just want to make sure it was correct for Mounjaro ?

## 2024-05-12 NOTE — Patient Instructions (Signed)

## 2024-05-13 ENCOUNTER — Telehealth: Payer: Self-pay | Admitting: Pharmacy Technician

## 2024-05-13 ENCOUNTER — Other Ambulatory Visit (HOSPITAL_COMMUNITY): Payer: Self-pay

## 2024-05-13 NOTE — Telephone Encounter (Signed)
 Pharmacy Patient Advocate Encounter   Received notification from CoverMyMeds that prior authorization for Mounjaro  2.5MG /0.5ML auto-injectors is due for renewal.   Insurance verification completed.   The patient is insured through [facility].  Action: PA required; PA submitted to above mentioned insurance via Latent Key/confirmation #/EOC [id number] Status is pending

## 2024-05-13 NOTE — Telephone Encounter (Signed)
 Pharmacy Patient Advocate Encounter  Received notification from [facility] that Prior Authorization for Mounjaro  2.5MG /0.5ML auto-injectors has been APPROVED from 05/13/24 to 05/13/25   PA #/Case ID/Reference #: APPROVAL LETTER HAS BEEN UPLOADED TO PT'S MEDIA TAB

## 2024-05-16 ENCOUNTER — Encounter: Payer: Self-pay | Admitting: Family Medicine

## 2024-05-16 ENCOUNTER — Other Ambulatory Visit: Payer: Self-pay

## 2024-05-16 ENCOUNTER — Ambulatory Visit: Payer: Self-pay | Admitting: Family Medicine

## 2024-05-16 VITALS — BP 124/74 | HR 90 | Resp 16 | Ht 71.0 in | Wt 287.1 lb

## 2024-05-16 DIAGNOSIS — E119 Type 2 diabetes mellitus without complications: Secondary | ICD-10-CM

## 2024-05-16 DIAGNOSIS — I10 Essential (primary) hypertension: Secondary | ICD-10-CM

## 2024-05-16 DIAGNOSIS — E1159 Type 2 diabetes mellitus with other circulatory complications: Secondary | ICD-10-CM

## 2024-05-16 DIAGNOSIS — Z0001 Encounter for general adult medical examination with abnormal findings: Secondary | ICD-10-CM

## 2024-05-16 DIAGNOSIS — Z7985 Long-term (current) use of injectable non-insulin antidiabetic drugs: Secondary | ICD-10-CM

## 2024-05-16 DIAGNOSIS — E669 Obesity, unspecified: Secondary | ICD-10-CM

## 2024-05-16 DIAGNOSIS — Z0000 Encounter for general adult medical examination without abnormal findings: Secondary | ICD-10-CM

## 2024-05-16 NOTE — Progress Notes (Signed)
 Name: Jeffrey Whitaker   MRN: [id number]    DOB: Aug 07, 1960   Date:05/16/2024       Progress Note  Subjective  Chief Complaint  Chief Complaint  Patient presents with   Annual Exam    HPI  Patient presents for annual CPE      IPSS     Row Name 05/16/24 0812         International Prostate Symptom Score   How often have you had the sensation of not emptying your bladder? Not at All     How often have you had to urinate less than every two hours? Not at All     How often have you found you stopped and started again several times when you urinated? Not at All     How often have you found it difficult to postpone urination? Not at All     How often have you had a weak urinary stream? Not at All     How often have you had to strain to start urination? Not at All     How many times did you typically get up at night to urinate? 2 Times     Total IPSS Score 2       Quality of Life due to urinary symptoms   If you were to spend the rest of your life with your urinary condition just the way it is now how would you feel about that? Delighted        Diet: Tolerating Mounjaro , eating smaller portions, making better food choices. Exercise: working out with a trainer Mondays and Wednesdays, he also walks about one mile the other days of the week Last Dental Exam: up to date  Last Eye Exam: scheduled   Depression: phq 9 is negative    05/16/2024    8:11 AM 04/15/2024    8:31 AM 10/15/2023    7:54 AM 05/12/2023   11:14 AM 03/17/2023    8:03 AM  Depression screen PHQ 2/9  Decreased Interest 0 0 0 0 0  Down, Depressed, Hopeless 0 0 0 0 0  PHQ - 2 Score 0 0 0 0 0  Altered sleeping   0 0 0  Tired, decreased energy   0 0 0  Change in appetite   0 0 0  Feeling bad or failure about yourself    0 0 0  Trouble concentrating   0 0 0  Moving slowly or fidgety/restless   0 0 0  Suicidal thoughts   0 0 0  PHQ-9 Score   0 0 0  Difficult doing work/chores   Not difficult at all       Hypertension:  BP Readings from Last 3 Encounters:  05/16/24 124/74  04/15/24 132/84  12/09/23 129/86    Obesity: Wt Readings from Last 3 Encounters:  05/16/24 287 lb 1.6 oz (130.2 kg)  04/15/24 299 lb (135.6 kg)  12/09/23 295 lb 6 oz (134 kg)   BMI Readings from Last 3 Encounters:  05/16/24 40.04 kg/m  04/15/24 41.70 kg/m  12/09/23 41.20 kg/m     [employer] Visit from 05/16/2024 in [facility]  AUDIT-C Score 2     Married STD testing and prevention (HIV/chl/gon/syphilis):  no Sexual history: normal libido, using Trimix  Hep C Screening: completed Skin cancer: Discussed monitoring for atypical lesions Colorectal cancer: up to date Prostate cancer:  yes, last level below 0.1. Seen by urologist in the past and  to go back if PSA goes up    Lung cancer:  Low Dose CT Chest recommended if Age 36-80 years, 30 pack-year currently smoking OR have quit w/in 15years. Patient  is not a candidate for screening   AAA: The USPSTF recommends one-time screening with ultrasonography in men ages 48 to 75 years who have ever smoked. Patient   is not a candidate for screening  ECG:  2019  Vaccines: reviewed with the patient.   Advanced Care Planning: A voluntary discussion about advance care planning including the explanation and discussion of advance directives.  Discussed health care proxy and Living will, and the patient was able to identify a health care proxy as wife .  Patient does not know have a living will and power of attorney of health care   Patient Active Problem List   Diagnosis Date Noted   Morbid obesity (HCC) 09/10/2020   History of prostate cancer 08/23/2019   Allergic rhinitis, seasonal 04/15/2015   Hypertension associated with type 2 diabetes mellitus (HCC) 04/11/2015   Dyslipidemia 04/11/2015   Right medial knee pain 04/11/2015   Left shoulder pain 04/11/2015   Umbilical hernia 04/11/2015   Microcytosis 04/11/2015     Past Surgical History:  Procedure Laterality Date   COLONOSCOPY     COLONOSCOPY WITH PROPOFOL  N/A 10/21/2022   Procedure: COLONOSCOPY WITH PROPOFOL ;  Surgeon: Unk Corinn Skiff, MD;  Location: [facility];  Service: Endoscopy;  Laterality: N/A;   HERNIA REPAIR     KNEE SURGERY Right    at age 63; secondary to previous surgery that cause an infection   PELVIC LYMPH NODE DISSECTION Bilateral 04/26/2018   Procedure: PELVIC LYMPH NODE DISSECTION;  Surgeon: Penne Knee, MD;  Location: [facility];  Service: Urology;  Laterality: Bilateral;   ROBOT ASSISTED LAPAROSCOPIC RADICAL PROSTATECTOMY N/A 04/26/2018   Procedure: ROBOTIC ASSISTED LAPAROSCOPIC RADICAL PROSTATECTOMY;  Surgeon: Penne Knee, MD;  Location: [facility];  Service: Urology;  Laterality: N/A;    Family History  Problem Relation Age of Onset   Hypertension Mother    CAD Mother    Lung cancer Mother    Cancer Mother    Benign prostatic hyperplasia Maternal Uncle    Prostate cancer Maternal Uncle    Hypertension Sister    Dementia Maternal Grandfather    Cancer Paternal Grandmother        Think stomach or intestinal   Kidney disease Neg Hx    Bladder Cancer Neg Hx    Kidney cancer Neg Hx     Social History   Socioeconomic History   Marital status: Married    Spouse name: Darice   Number of children: 2   Years of education: Not on file   Highest education level: Associate degree: occupational, Scientist, product/process development, or vocational program  Occupational History   Not on file  Tobacco Use   Smoking status: Former    Types: Cigars   Smokeless tobacco: Former    Types: Chew    Quit date: 07/29/1987  Vaping Use   Vaping status: Never Used  Substance and Sexual Activity   Alcohol use: Yes    Alcohol/week: 1.0 standard drink of alcohol    Types: 1 Shots of liquor per week    Comment: rarely   Drug use: No   Sexual activity: Yes    Partners: Female    Birth control/protection: None  Other Topics Concern   Not  on file  Social History Narrative   Patient has 2 step-children  Social Drivers of Corporate investment banker Strain: Low Risk  (05/12/2024)   Overall Financial Resource Strain (CARDIA)    Difficulty of Paying Living Expenses: Not hard at all  Food Insecurity: No Food Insecurity (05/12/2024)   Hunger Vital Sign    Worried About Running Out of Food in the Last Year: Never true    Ran Out of Food in the Last Year: Never true  Transportation Needs: No Transportation Needs (05/12/2024)   PRAPARE - Administrator, Civil Service (Medical): No    Lack of Transportation (Non-Medical): No  Physical Activity: Sufficiently Active (05/12/2024)   Exercise Vital Sign    Days of Exercise per Week: 4 days    Minutes of Exercise per Session: 40 min  Stress: No Stress Concern Present (05/12/2024)   [employer] of Occupational Health - Occupational Stress Questionnaire    Feeling of Stress: Not at all  Social Connections: Socially Integrated (05/12/2024)   Social Connection and Isolation Panel    Frequency of Communication with Friends and Family: Three times a week    Frequency of Social Gatherings with Friends and Family: Twice a week    Attends Religious Services: 1 to 4 times per year    Active Member of [employer] or Organizations: Yes    Attends Banker Meetings: Patient declined    Marital Status: Married  Catering manager Violence: Not At Risk (05/16/2024)   Humiliation, Afraid, Rape, and Kick questionnaire    Fear of Current or Ex-Partner: No    Emotionally Abused: No    Physically Abused: No    Sexually Abused: No     Current Outpatient Medications:    cholecalciferol (VITAMIN D3) 25 MCG (1000 UNIT) tablet, Take 1,000 Units by mouth daily., Disp: , Rfl:    Multiple Vitamins-Minerals (MENS 50+ MULTI VITAMIN/MIN PO), Take 1 tablet by mouth daily., Disp: , Rfl:    NONFORMULARY OR COMPOUNDED ITEM, Trimix (30/2/20)-(Pap/Phent/PGE)  Dosage: Inject 0.7 cc per  injection  Vial 1ml  Qty #10 Refills 6, Disp: 10 each, Rfl: 6   pravastatin  (PRAVACHOL ) 40 MG tablet, Take 1 tablet (40 mg total) by mouth daily., Disp: 90 tablet, Rfl: 1   tirzepatide  (MOUNJARO ) 5 MG/0.5ML Pen, Inject 5 mg into the skin once a week., Disp: 2 mL, Rfl: 0   triamcinolone  cream (KENALOG ) 0.1 %, Apply 1 Application topically 2 (two) times daily., Disp: 453.6 g, Rfl: 0   valsartan -hydrochlorothiazide  (DIOVAN -HCT) 80-12.5 MG tablet, Take 1 tablet by mouth daily., Disp: 90 tablet, Rfl: 1  No Known Allergies   ROS  Constitutional: Negative for fever , positive for weight change- loss.  Respiratory: Negative for cough and shortness of breath.   Cardiovascular: Negative for chest pain or palpitations.  Gastrointestinal: Negative for abdominal pain, no bowel changes.  Musculoskeletal: Negative for gait problem, he has decrease rom of right knee - old surgery  Skin: Negative for rash.  Neurological: Negative for dizziness or headache.  No other specific complaints in a complete review of systems (except as listed in HPI above).    Objective  Vitals:   05/16/24 0813  BP: 124/74  Pulse: 90  Resp: 16  SpO2: 99%  Weight: 287 lb 1.6 oz (130.2 kg)  Height: 5' 11 (1.803 m)    Body mass index is 40.04 kg/m.  Physical Exam  Constitutional: Patient appears well-developed and well-nourished. No distress.  HENT: Head: Normocephalic and atraumatic. Ears: B TMs ok, no erythema or effusion; Nose: Nose normal.  Mouth/Throat: Oropharynx is clear and moist. No oropharyngeal exudate.  Eyes: Conjunctivae and EOM are normal. Pupils are equal, round, and reactive to light. No scleral icterus.  Neck: Normal range of motion. Neck supple. No JVD present. No thyromegaly present.  Cardiovascular: Normal rate, regular rhythm and normal heart sounds.  No murmur heard. No BLE edema. Pulmonary/Chest: Effort normal and breath sounds normal. No respiratory distress. Abdominal: Soft. Bowel sounds are  normal, no distension. There is no tenderness. no masses MALE GENITALIA: Normal descended testes bilaterally, no masses palpated, no hernias, no lesions, no discharge RECTAL: not done, s/p prostatectomy  Musculoskeletal: decrease rom of right knee due to old knee surgery at age 4 Neurological: he is alert and oriented to person, place, and time. No cranial nerve deficit. Coordination, balance, strength, speech and gait are normal.  Skin: Skin is warm and dry. No rash noted. No erythema.  Psychiatric: Patient has a normal mood and affect. behavior is normal. Judgment and thought content normal.   Diabetic Foot Exam - Simple   Simple Foot Form Visual Inspection No deformities, no ulcerations, no other skin breakdown bilaterally: Yes Sensation Testing Intact to touch and monofilament testing bilaterally: Yes Pulse Check Posterior Tibialis and Dorsalis pulse intact bilaterally: Yes Comments       Assessment & Plan  1. Well adult exam (Primary)  Recheck labs next visit   2. Obesity, diabetes, and hypertension syndrome (HCC)  - HM Diabetes Foot Exam     -Prostate cancer screening and PSA options (with potential risks and benefits of testing vs not testing) were discussed along with recent recs/guidelines. -USPSTF grade A and B recommendations reviewed with patient; age-appropriate recommendations, preventive care, screening tests, etc discussed and encouraged; healthy living encouraged; see AVS for patient education given to patient -Discussed importance of 150 minutes of physical activity weekly, eat two servings of fish weekly, eat one serving of tree nuts ( cashews, pistachios, pecans, almonds.SABRA) every other day, eat 6 servings of fruit/vegetables daily and drink plenty of water  and avoid sweet beverages.  -Reviewed Health Maintenance: yes

## 2024-06-06 ENCOUNTER — Other Ambulatory Visit: Payer: Self-pay | Admitting: Family Medicine

## 2024-06-06 MED ORDER — TIRZEPATIDE 7.5 MG/0.5ML ~~LOC~~ SOAJ: 7.5000 mg | SUBCUTANEOUS | 0 refills | Status: DC

## 2024-06-13 LAB — OPHTHALMOLOGY REPORT-SCANNED

## 2024-06-29 ENCOUNTER — Ambulatory Visit: Payer: Self-pay | Admitting: *Deleted

## 2024-06-29 NOTE — Telephone Encounter (Signed)
 FYI Only or Action Required?: FYI only for provider: appointment scheduled on 12/4.  Patient was last seen in primary care on 05/16/2024 by Glenard Mire, MD.  Called Nurse Triage reporting Shoulder Pain.  Symptoms began several weeks ago.  Interventions attempted: OTC medications: tylenol ,aleve.  Symptoms are: unchanged.  Triage Disposition: See PCP When Office is Open (Within 3 Days)  Patient/caregiver understands and will follow disposition?: Yes  Copied from CRM #[id number]. Topic: Clinical - Red Word Triage >> Jun 29, 2024  9:39 AM Deaijah H wrote: Red Word that prompted transfer to Nurse Triage: Shoulder pain in both shoulder and weakness when trying to lift Reason for Disposition  [1] MODERATE pain (e.g., interferes with normal activities) AND [2] present > 3 days  Answer Assessment - Initial Assessment Questions 1. ONSET: When did the pain start?     2 weeks 2. LOCATION: Where is the pain located?     When lifting arms laterally- under clavicle and tightness in trapezius- bilateral- started on R  3. PAIN: How bad is the pain? (Scale 1-10; or mild, moderate, severe)     7-8/10 4. WORK OR EXERCISE: Has there been any recent work or exercise that involved this part of the body?     Patient does work out M/W- proofreader 5. CAUSE: What do you think is causing the shoulder pain?     Not sure 6. OTHER SYMPTOMS: Do you have any other symptoms? (e.g., neck pain, swelling, rash, fever, numbness, weakness)     Tightness in the neck, weakness  Protocols used: Shoulder Pain-A-AH

## 2024-06-30 ENCOUNTER — Ambulatory Visit: Admitting: Family Medicine

## 2024-06-30 ENCOUNTER — Encounter: Payer: Self-pay | Admitting: Family Medicine

## 2024-06-30 VITALS — BP 130/82 | HR 76 | Resp 16 | Ht 71.0 in | Wt 277.0 lb

## 2024-06-30 DIAGNOSIS — I152 Hypertension secondary to endocrine disorders: Secondary | ICD-10-CM

## 2024-06-30 DIAGNOSIS — E1159 Type 2 diabetes mellitus with other circulatory complications: Secondary | ICD-10-CM

## 2024-06-30 DIAGNOSIS — M25512 Pain in left shoulder: Secondary | ICD-10-CM

## 2024-06-30 DIAGNOSIS — M542 Cervicalgia: Secondary | ICD-10-CM | POA: Diagnosis not present

## 2024-06-30 DIAGNOSIS — M25511 Pain in right shoulder: Secondary | ICD-10-CM

## 2024-06-30 MED ORDER — DICLOFENAC SODIUM 1 % EX GEL: 2.0000 g | Freq: Four times a day (QID) | CUTANEOUS | 0 refills | Status: AC | PRN

## 2024-06-30 MED ORDER — TIZANIDINE HCL 4 MG PO TABS: 4.0000 mg | ORAL_TABLET | Freq: Two times a day (BID) | ORAL | 0 refills | Status: DC | PRN

## 2024-06-30 MED ORDER — CELECOXIB 100 MG PO CAPS: 100.0000 mg | ORAL_CAPSULE | Freq: Two times a day (BID) | ORAL | 0 refills | Status: DC | PRN

## 2024-06-30 NOTE — Progress Notes (Signed)
 Acute Office Visit  Subjective:     Patient ID: Jeffrey Whitaker, male    DOB: [birth year], 63 y.o.   MRN: [id number]  Chief Complaint  Patient presents with   Shoulder Pain    B/L, x3 weeks. OTC meds not helping.    Shoulder Pain    Patient is in today for complaints of bilateral shoulder pain. He voices initially pain started with the right shoulder but has migrated to the left shoulder. He voices right shoulder pain is worse than left shoulder pain. He feels tension in between the shoulder blades. He voices right shoulder pain extends to the right upper arm. He endorses associated neck pain. Cervical spine non tender to palpation. He does endorse some weakness of bilateral upper extremities, noticing a decline in strength. He does endorse paresthesias but this is infrequent. ROM limited due to discomfort. Active ROM limited both RUE and LUE, however less ROM with RUE. Flexion, extension, abduction and adduction affected due to discomfort bilaterally.   He has tried stretching, heat therapy, OTC aleve and tylenol , and OTC topical analgesics with minimal temporary relief. He reports prolonged sitting or rest aggravates pain, but continued movement aggravates pain. He denies falls or other injuries prior to onset of symptoms. Denies prior surgical hx for shoulders. Denies hx of prior injuries involving the shoulders.    Review of Systems  Musculoskeletal:  Positive for joint pain.        Objective:    BP 130/82   Pulse 76   Resp 16   Ht 5' 11 (1.803 m)   Wt 277 lb (125.6 kg)   SpO2 99%   BMI 38.63 kg/m  BP Readings from Last 3 Encounters:  06/30/24 130/82  05/16/24 124/74  04/15/24 132/84     Lab Results  Component Value Date   HGBA1C 6.3 (A) 04/15/2024   HGBA1C 6.0 (A) 10/15/2023   HGBA1C 6.4 (A) 05/12/2023       Latest Ref Rng & Units 10/15/2023    8:40 AM 09/15/2022    8:45 AM 09/12/2021    9:36 AM  CMP  Glucose 65 - 99 mg/dL 892  98  91   BUN 7 - 25 mg/dL 15   17  18    Creatinine 0.70 - 1.35 mg/dL 8.77  8.78  8.73   Sodium 135 - 146 mmol/L 143  142  141   Potassium 3.5 - 5.3 mmol/L 4.2  4.5  4.1   Chloride 98 - 110 mmol/L 106  106  105   CO2 20 - 32 mmol/L 29  28  28    Calcium 8.6 - 10.3 mg/dL 9.4  9.6  9.5   Total Protein 6.1 - 8.1 g/dL 7.0  7.2  7.2   Total Bilirubin 0.2 - 1.2 mg/dL 0.5  0.5  0.5   AST 10 - 35 U/L 16  16  17    ALT 9 - 46 U/L 21  23  22            Physical Exam Constitutional:      Appearance: Normal appearance.  Cardiovascular:     Rate and Rhythm: Normal rate and regular rhythm.     Heart sounds: Normal heart sounds.  Pulmonary:     Effort: Pulmonary effort is normal. No respiratory distress.     Breath sounds: Normal breath sounds.  Musculoskeletal:     Right shoulder: Tenderness present. No deformity. Decreased range of motion.     Left shoulder: Tenderness present. No  deformity. Decreased range of motion.     Cervical back: No tenderness.  Skin:    General: Skin is warm and dry.  Neurological:     General: No focal deficit present.     Mental Status: He is alert. Mental status is at baseline.  Psychiatric:        Mood and Affect: Mood normal.        Behavior: Behavior normal.       Assessment & Plan:   1. Acute pain of both shoulders (Primary) Bilateral shoulder pain starting 3 weeks ago. Tried and failed OTC analgesics and topical analgesics. Heat providing some relief of pain though temporary. Continues gentle stretching with some relief though temporary. Left and right shoulder joints tender to palpation. Decreased ROM affecting bilateral UE s/t pain.  -Start Celebrex 100mg  BID and Tizanidine 4mg  BID. Advised potential that Tizanidine can cause drowsiness and medication should not be taken prior to driving. Advised to discontinue OTC NSAIDs but ok to take Tylenol  as needed for pain.  -Continue conservative management for the next 1 to 2 weeks and if no improvement can consider x-ray imaging. Advised to  continue with heat therapy, can try ice therapy.  -Voltaren gel provided to be used as needed for pain relief.   - celecoxib (CELEBREX) 100 MG capsule; Take 1 capsule (100 mg total) by mouth 2 (two) times daily as needed (pain).  Dispense: 30 capsule; Refill: 0 - tiZANidine (ZANAFLEX) 4 MG tablet; Take 1 tablet (4 mg total) by mouth 2 (two) times daily as needed (pain).  Dispense: 20 tablet; Refill: 0  2. Neck pain Cervical spine non tender to palpation. He describes neck pain as tension that spreads across the shoulders. Some tenderness with palpation of the trapezius muscles and rhomboids.   - celecoxib (CELEBREX) 100 MG capsule; Take 1 capsule (100 mg total) by mouth 2 (two) times daily as needed (pain).  Dispense: 30 capsule; Refill: 0 - tiZANidine (ZANAFLEX) 4 MG tablet; Take 1 tablet (4 mg total) by mouth 2 (two) times daily as needed (pain).  Dispense: 20 tablet; Refill: 0  3. Hypertension associated with type 2 diabetes mellitus (HCC) -BP at goal at today's visit- HTN controlled. -Refrain from steroid use with diagnosed type 2 DM. Type 2 DM well controlled, A1c at goal.  -Advised to maintain scheduled follow up with PCP for next month.     Return if symptoms worsen or fail to improve.  LAYMON LOISE CORE, FNP

## 2024-07-01 ENCOUNTER — Other Ambulatory Visit: Payer: Self-pay | Admitting: Family Medicine

## 2024-07-04 ENCOUNTER — Other Ambulatory Visit: Payer: Self-pay | Admitting: Family Medicine

## 2024-07-04 MED ORDER — TIRZEPATIDE 10 MG/0.5ML ~~LOC~~ SOAJ: 10.0000 mg | SUBCUTANEOUS | 0 refills | Status: DC

## 2024-07-04 NOTE — Telephone Encounter (Signed)
 Discontinued 07/04/24, dose change.  Requested Prescriptions  Pending Prescriptions Disp Refills   MOUNJARO  7.5 MG/0.5ML Pen [Pharmacy Med Name: Mounjaro  7.5 MG/0.5ML Subcutaneous Solution Auto-injector] 4 mL 0    Sig: INJECT 1/2 (ONE-HALF) ML 7.5 MG SUBCUTANEOUSLY  ONCE A WEEK     Off-Protocol Failed - 07/04/2024  2:58 PM      Failed - Medication not assigned to a protocol, review manually.      Passed - Valid encounter within last 12 months    Recent Outpatient Visits           4 days ago Acute pain of both shoulders   [facility], Laymon SAILOR, FNP   1 month ago Well adult exam   [facility] Glenard Mire, MD   2 months ago Obesity, diabetes, and hypertension syndrome [facility])   [facility] Glenard Mire, MD   8 months ago Morbid obesity [facility])   [facility] Sowles, Krichna, MD

## 2024-07-12 ENCOUNTER — Encounter: Payer: Self-pay | Admitting: Family Medicine

## 2024-07-12 DIAGNOSIS — M25511 Pain in right shoulder: Secondary | ICD-10-CM

## 2024-07-12 DIAGNOSIS — M542 Cervicalgia: Secondary | ICD-10-CM

## 2024-07-12 MED ORDER — CELECOXIB 100 MG PO CAPS: 100.0000 mg | ORAL_CAPSULE | Freq: Two times a day (BID) | ORAL | 0 refills | Status: AC | PRN

## 2024-07-12 MED ORDER — TIZANIDINE HCL 4 MG PO TABS: 4.0000 mg | ORAL_TABLET | Freq: Two times a day (BID) | ORAL | 0 refills | Status: AC | PRN

## 2024-07-12 MED ORDER — METHYLPREDNISOLONE 4 MG PO TBPK: ORAL_TABLET | ORAL | 0 refills | Status: DC

## 2024-07-13 ENCOUNTER — Ambulatory Visit
Admission: RE | Admit: 2024-07-13 | Discharge: 2024-07-13 | Disposition: A | Discharge status: Home / Self Care | Source: Ambulatory Visit | Attending: Family Medicine | Admitting: Family Medicine

## 2024-07-13 ENCOUNTER — Ambulatory Visit
Admission: RE | Admit: 2024-07-13 | Discharge: 2024-07-13 | Disposition: A | Discharge status: Home / Self Care | Source: Home / Self Care | Attending: Family Medicine | Admitting: Family Medicine

## 2024-07-13 DIAGNOSIS — M542 Cervicalgia: Secondary | ICD-10-CM | POA: Diagnosis present

## 2024-07-13 DIAGNOSIS — M25511 Pain in right shoulder: Secondary | ICD-10-CM | POA: Diagnosis present

## 2024-07-13 DIAGNOSIS — M25512 Pain in left shoulder: Secondary | ICD-10-CM | POA: Diagnosis present

## 2024-07-25 ENCOUNTER — Ambulatory Visit: Payer: Self-pay | Admitting: Family Medicine

## 2024-07-25 DIAGNOSIS — M542 Cervicalgia: Secondary | ICD-10-CM

## 2024-07-25 DIAGNOSIS — M25511 Pain in right shoulder: Secondary | ICD-10-CM

## 2024-07-27 ENCOUNTER — Other Ambulatory Visit: Payer: Self-pay | Admitting: Family Medicine

## 2024-07-28 NOTE — Telephone Encounter (Signed)
 Requested medications are due for refill today.  yes  Requested medications are on the active medications list.  yes  Last refill. 07/04/2024 2mL 0 rf  Future visit scheduled.   yes  Notes to clinic.  Medication not assigned to a protocol. Please review for refill.     Requested Prescriptions  Pending Prescriptions Disp Refills   MOUNJARO  10 MG/0.5ML Pen [Pharmacy Med Name: Mounjaro  10 MG/0.5ML Subcutaneous Solution Auto-injector] 4 mL 0    Sig: INJECT 1 SYRINGE SUBCUTANEOUSLY ONCE A WEEK     Off-Protocol Failed - 07/28/2024 12:38 PM      Failed - Medication not assigned to a protocol, review manually.      Passed - Valid encounter within last 12 months    Recent Outpatient Visits           4 weeks ago Acute pain of both shoulders   [facility], Laymon SAILOR, FNP   2 months ago Well adult exam   [facility] Glenard Mire, MD   3 months ago Obesity, diabetes, and hypertension syndrome [facility])   [facility] Glenard Mire, MD   9 months ago Morbid obesity [facility])   [facility] Sowles, Krichna, MD

## 2024-07-29 ENCOUNTER — Other Ambulatory Visit: Payer: Self-pay | Admitting: Family Medicine

## 2024-07-29 MED ORDER — TIRZEPATIDE 12.5 MG/0.5ML ~~LOC~~ SOAJ: 12.5000 mg | SUBCUTANEOUS | 0 refills | Status: DC

## 2024-08-08 ENCOUNTER — Ambulatory Visit (INDEPENDENT_AMBULATORY_CARE_PROVIDER_SITE_OTHER): Admitting: Family Medicine

## 2024-08-08 ENCOUNTER — Encounter: Payer: Self-pay | Admitting: Family Medicine

## 2024-08-08 VITALS — BP 124/80 | HR 100 | Resp 16 | Ht 71.0 in | Wt 266.6 lb

## 2024-08-08 DIAGNOSIS — E1169 Type 2 diabetes mellitus with other specified complication: Secondary | ICD-10-CM | POA: Diagnosis not present

## 2024-08-08 DIAGNOSIS — Z6837 Body mass index (BMI) 37.0-37.9, adult: Secondary | ICD-10-CM

## 2024-08-08 DIAGNOSIS — M25512 Pain in left shoulder: Secondary | ICD-10-CM | POA: Diagnosis not present

## 2024-08-08 DIAGNOSIS — I152 Hypertension secondary to endocrine disorders: Secondary | ICD-10-CM

## 2024-08-08 DIAGNOSIS — E66812 Obesity, class 2: Secondary | ICD-10-CM

## 2024-08-08 DIAGNOSIS — M25511 Pain in right shoulder: Secondary | ICD-10-CM | POA: Diagnosis not present

## 2024-08-08 DIAGNOSIS — E1159 Type 2 diabetes mellitus with other circulatory complications: Secondary | ICD-10-CM | POA: Diagnosis not present

## 2024-08-08 DIAGNOSIS — E119 Type 2 diabetes mellitus without complications: Secondary | ICD-10-CM

## 2024-08-08 DIAGNOSIS — Z7985 Long-term (current) use of injectable non-insulin antidiabetic drugs: Secondary | ICD-10-CM | POA: Diagnosis not present

## 2024-08-08 DIAGNOSIS — G8929 Other chronic pain: Secondary | ICD-10-CM | POA: Insufficient documentation

## 2024-08-08 DIAGNOSIS — E785 Hyperlipidemia, unspecified: Secondary | ICD-10-CM | POA: Diagnosis not present

## 2024-08-08 DIAGNOSIS — I10 Essential (primary) hypertension: Secondary | ICD-10-CM | POA: Diagnosis not present

## 2024-08-08 LAB — POCT GLYCOSYLATED HEMOGLOBIN (HGB A1C): Hemoglobin A1C: 5.9 % — AB (ref 4.0–5.6)

## 2024-08-08 MED ORDER — TIRZEPATIDE 12.5 MG/0.5ML ~~LOC~~ SOAJ: 12.5000 mg | SUBCUTANEOUS | 0 refills | Status: DC

## 2024-08-08 NOTE — Progress Notes (Signed)
 Name: Jeffrey Whitaker   MRN: [id number]    DOB: [birth year]   Date:08/08/2024       Progress Note  Subjective  Chief Complaint  Chief Complaint  Patient presents with   Medical Management of Chronic Issues   Discussed the use of AI scribe software for clinical note transcription with the patient, who gave verbal consent to proceed.  History of Present Illness Jeffrey Whitaker is a 64 year old male with type 2 diabetes and obesity who presents for a follow-up visit.  He has a mild cough and a scratchy throat, described as a 'little tickle' in the back of his throat. No runny nose, fever, or chills are present.  His heart rate was elevated to around 100 bpm during the visit, although it typically ranges in the upper seventies to low eighties at home. No palpitations are experienced. He recalls a previous heart rate of 57 in 2019.  He was diagnosed with type 2 diabetes in February 2024. Initially managed with diet, his A1c remained elevated, leading to the initiation of Mounjaro , with the dose increased over time to 12.5 mg. His A1c improved from 6.3 in September to 5.9 currently. He has lost 33 pounds, reducing his weight from 299 to 266 pounds, and his BMI has decreased from over 40 to 37.18.  He is actively engaged in physical activity, working out with a psychologist, educational on Mondays and Wednesdays. He is mindful of his diet, focusing on protein intake and reducing carbohydrate consumption. He uses protein shakes and peanut butter powder to supplement his protein intake and has reduced his intake of sweets and sodas, opting for healthier alternatives like Gatorade Zero and electrolyte tablets.  He experiences chronic shoulder pain, persisting for over six weeks. Evaluated by orthopedics, he is undergoing physical therapy. A Medrol  dose pack provided temporary relief, but the pain returned after completion. The pain affects both shoulders and he reports that the orthopedics team described it as a muscle  impingement.  HTN is well controlled, continue current regiment, no chest pain or palpitation, he will monitor his heart rate at home  Dyslipidemia associated with DM , taking statin therapy and denies side effects    Patient Active Problem List   Diagnosis Date Noted   Morbid obesity (HCC) 09/10/2020   History of prostate cancer 08/23/2019   Allergic rhinitis, seasonal 04/15/2015   Hypertension associated with type 2 diabetes mellitus (HCC) 04/11/2015   Dyslipidemia 04/11/2015   Right medial knee pain 04/11/2015   Left shoulder pain 04/11/2015   Umbilical hernia 04/11/2015   Microcytosis 04/11/2015    Past Surgical History:  Procedure Laterality Date   COLONOSCOPY     COLONOSCOPY WITH PROPOFOL  N/A 10/21/2022   Procedure: COLONOSCOPY WITH PROPOFOL ;  Surgeon: Unk Corinn Skiff, MD;  Location: [facility];  Service: Endoscopy;  Laterality: N/A;   HERNIA REPAIR     KNEE SURGERY Right    at age 78; secondary to previous surgery that cause an infection   PELVIC LYMPH NODE DISSECTION Bilateral 04/26/2018   Procedure: PELVIC LYMPH NODE DISSECTION;  Surgeon: Penne Knee, MD;  Location: [facility];  Service: Urology;  Laterality: Bilateral;   ROBOT ASSISTED LAPAROSCOPIC RADICAL PROSTATECTOMY N/A 04/26/2018   Procedure: ROBOTIC ASSISTED LAPAROSCOPIC RADICAL PROSTATECTOMY;  Surgeon: Penne Knee, MD;  Location: [facility];  Service: Urology;  Laterality: N/A;    Family History  Problem Relation Age of Onset   Hypertension Mother    CAD Mother    Lung cancer  Mother    Cancer Mother    Benign prostatic hyperplasia Maternal Uncle    Prostate cancer Maternal Uncle    Hypertension Sister    Dementia Maternal Grandfather    Cancer Paternal Grandmother        Think stomach or intestinal   Kidney disease Neg Hx    Bladder Cancer Neg Hx    Kidney cancer Neg Hx     Social History   Tobacco Use   Smoking status: Former    Types: Cigars   Smokeless tobacco: Former     Types: Chew    Quit date: 07/29/1987  Substance Use Topics   Alcohol use: Yes    Alcohol/week: 1.0 standard drink of alcohol    Types: 1 Shots of liquor per week    Comment: rarely    Current Medications[1]  Allergies[2]  I personally reviewed active problem list, medication list, allergies, family history with the patient/caregiver today.   ROS  Ten systems reviewed and is negative except as mentioned in HPI    Objective Physical Exam  CONSTITUTIONAL: Patient appears well-developed and well-nourished. No distress. HEENT: Head atraumatic, normocephalic, neck supple. CARDIOVASCULAR: Normal rate, regular rhythm and normal heart sounds. No murmur heard. No BLE edema. PULMONARY: Effort normal and breath sounds normal. No respiratory distress. ABDOMINAL: There is no tenderness or distention. MUSCULOSKELETAL: Normal gait. Without gross motor or sensory deficit. PSYCHIATRIC: Patient has a normal mood and affect. Behavior is normal. Judgment and thought content normal.  Vitals:   08/08/24 0858 08/08/24 0907  BP: 124/80   Pulse: (!) 105 100  Resp: 16   SpO2: 94%   Weight: 266 lb 9.6 oz (120.9 kg)   Height: 5' 11 (1.803 m)     Body mass index is 37.18 kg/m.  Recent Results (from the past 2160 hours)  POCT glycosylated hemoglobin (Hb A1C)     Status: Abnormal   Collection Time: 08/08/24  9:07 AM  Result Value Ref Range   Hemoglobin A1C 5.9 (A) 4.0 - 5.6 %   HbA1c POC (<> result, manual entry)     HbA1c, POC (prediabetic range)     HbA1c, POC (controlled diabetic range)       PHQ2/9:    08/08/2024    8:51 AM 05/16/2024    8:11 AM 04/15/2024    8:31 AM 10/15/2023    7:54 AM 05/12/2023   11:14 AM  Depression screen PHQ 2/9  Decreased Interest 0 0 0 0 0  Down, Depressed, Hopeless 0 0 0 0 0  PHQ - 2 Score 0 0 0 0 0  Altered sleeping    0 0  Tired, decreased energy    0 0  Change in appetite    0 0  Feeling bad or failure about yourself     0 0  Trouble concentrating     0 0  Moving slowly or fidgety/restless    0 0  Suicidal thoughts    0 0  PHQ-9 Score    0  0   Difficult doing work/chores    Not difficult at all      Data saved with a previous flowsheet row definition    phq 9 is negative  Fall Risk:    08/08/2024    8:51 AM 05/16/2024    8:11 AM 04/15/2024    8:31 AM 05/12/2023   11:14 AM 03/17/2023    8:03 AM  Fall Risk   Falls in the past year? 0 0 0 0  0  Number falls in past yr: 0 0 0  0  Injury with Fall? 0 0  0   0   Risk for fall due to : No Fall Risks No Fall Risks No Fall Risks No Fall Risks No Fall Risks  Follow up Falls evaluation completed Falls evaluation completed Falls evaluation completed Falls prevention discussed;Education provided;Falls evaluation completed Falls prevention discussed     Data saved with a previous flowsheet row definition     Assessment & Plan Type 2 diabetes mellitus complicated by dyslipidemia and morbid obesity. HTN, DM and obesity syndrome A1c improved to 5.9%. Weight reduced to 266 lbs, BMI 37.8. Dyslipidemia and morbid obesity persist. Mounjaro  effective for weight loss and glycemic control. Discussed protein intake to prevent muscle mass loss and potential weight loss plateau. - Continue Mounjaro  12.5 mg for 3 months. - Encouraged protein intake of at least 100 grams per day. - Advised on portion control and reducing carbohydrate intake. - Encouraged continued physical activity, including strength training and cardio. - Scheduled follow-up in 3 months.  Chronic bilateral shoulder pain Muscle impingement diagnosed. Pain persists despite Medrol  and Celebrex . Improved with physical therapy and passive exercises. - Continue physical therapy for shoulder pain. - Encouraged passive exercises to improve shoulder mobility. - Advised on sleeping positions to alleviate shoulder pain.        [1]  Current Outpatient Medications:    celecoxib  (CELEBREX ) 100 MG capsule, Take 1 capsule (100 mg total) by  mouth 2 (two) times daily as needed (pain)., Disp: 30 capsule, Rfl: 0   cholecalciferol (VITAMIN D3) 25 MCG (1000 UNIT) tablet, Take 1,000 Units by mouth daily., Disp: , Rfl:    diclofenac  Sodium (VOLTAREN  ARTHRITIS PAIN) 1 % GEL, Apply 2 g topically 4 (four) times daily as needed (shoulder or neck pain)., Disp: 100 g, Rfl: 0   methylPREDNISolone  (MEDROL  DOSEPAK) 4 MG TBPK tablet, Take by mouth as directed., Disp: , Rfl:    Multiple Vitamins-Minerals (MENS 50+ MULTI VITAMIN/MIN PO), Take 1 tablet by mouth daily., Disp: , Rfl:    NONFORMULARY OR COMPOUNDED ITEM, Trimix (30/2/20)-(Pap/Phent/PGE)  Dosage: Inject 0.7 cc per injection  Vial 1ml  Qty #10 Refills 6, Disp: 10 each, Rfl: 6   pravastatin  (PRAVACHOL ) 40 MG tablet, Take 1 tablet (40 mg total) by mouth daily., Disp: 90 tablet, Rfl: 1   tirzepatide  (MOUNJARO ) 12.5 MG/0.5ML Pen, Inject 12.5 mg into the skin once a week., Disp: 2 mL, Rfl: 0   tiZANidine  (ZANAFLEX ) 4 MG tablet, Take 1 tablet (4 mg total) by mouth 2 (two) times daily as needed (pain)., Disp: 20 tablet, Rfl: 0   triamcinolone  cream (KENALOG ) 0.1 %, Apply 1 Application topically 2 (two) times daily., Disp: 453.6 g, Rfl: 0   valsartan -hydrochlorothiazide  (DIOVAN -HCT) 80-12.5 MG tablet, Take 1 tablet by mouth daily., Disp: 90 tablet, Rfl: 1 [2] No Known Allergies

## 2024-08-29 ENCOUNTER — Encounter: Payer: Self-pay | Admitting: Family Medicine

## 2024-08-29 DIAGNOSIS — E1169 Type 2 diabetes mellitus with other specified complication: Secondary | ICD-10-CM

## 2024-08-29 DIAGNOSIS — E119 Type 2 diabetes mellitus without complications: Secondary | ICD-10-CM

## 2024-08-29 MED ORDER — TIRZEPATIDE 12.5 MG/0.5ML ~~LOC~~ SOAJ: 12.5000 mg | SUBCUTANEOUS | 0 refills | Status: AC

## 2024-11-14 ENCOUNTER — Ambulatory Visit: Admitting: Family Medicine
